# Patient Record
Sex: Male | Born: 1950
Health system: Southern US, Community
[De-identification: ages and names within clinical notes are randomized; demographics above are authoritative.]

## PROBLEM LIST (undated history)

## (undated) DIAGNOSIS — I219 Acute myocardial infarction, unspecified: Secondary | ICD-10-CM

## (undated) DIAGNOSIS — I499 Cardiac arrhythmia, unspecified: Secondary | ICD-10-CM

## (undated) DIAGNOSIS — N200 Calculus of kidney: Secondary | ICD-10-CM

## (undated) DIAGNOSIS — F329 Major depressive disorder, single episode, unspecified: Secondary | ICD-10-CM

## (undated) DIAGNOSIS — G20 Parkinson's disease: Secondary | ICD-10-CM

## (undated) DIAGNOSIS — Z87442 Personal history of urinary calculi: Secondary | ICD-10-CM

## (undated) DIAGNOSIS — F419 Anxiety disorder, unspecified: Secondary | ICD-10-CM

## (undated) DIAGNOSIS — Z860100 Personal history of colon polyps, unspecified: Secondary | ICD-10-CM

## (undated) DIAGNOSIS — K219 Gastro-esophageal reflux disease without esophagitis: Secondary | ICD-10-CM

## (undated) DIAGNOSIS — F32A Depression, unspecified: Secondary | ICD-10-CM

## (undated) DIAGNOSIS — I10 Essential (primary) hypertension: Secondary | ICD-10-CM

## (undated) DIAGNOSIS — R011 Cardiac murmur, unspecified: Secondary | ICD-10-CM

## (undated) DIAGNOSIS — I2510 Atherosclerotic heart disease of native coronary artery without angina pectoris: Secondary | ICD-10-CM

## (undated) HISTORY — PX: OTHER SURGICAL HISTORY: SHX169

---
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
## 2002-09-19 ENCOUNTER — Encounter: Admission: RE | Admit: 2002-09-19 | Discharge: 2002-09-19 | Payer: Self-pay | Admitting: Family Medicine

## 2002-09-27 ENCOUNTER — Encounter: Admission: RE | Admit: 2002-09-27 | Discharge: 2002-09-27 | Payer: Self-pay | Admitting: Family Medicine

## 2002-10-21 ENCOUNTER — Encounter: Admission: RE | Admit: 2002-10-21 | Discharge: 2002-10-21 | Payer: Self-pay | Admitting: Family Medicine

## 2003-06-27 ENCOUNTER — Encounter: Admission: RE | Admit: 2003-06-27 | Discharge: 2003-06-27 | Payer: Self-pay | Admitting: Family Medicine

## 2004-04-29 ENCOUNTER — Encounter: Admission: RE | Admit: 2004-04-29 | Discharge: 2004-04-29 | Payer: Self-pay | Admitting: Family Medicine

## 2004-06-05 ENCOUNTER — Emergency Department (HOSPITAL_COMMUNITY): Admission: EM | Admit: 2004-06-05 | Discharge: 2004-06-05 | Payer: Self-pay | Admitting: *Deleted

## 2004-07-10 ENCOUNTER — Ambulatory Visit (HOSPITAL_COMMUNITY): Admission: RE | Admit: 2004-07-10 | Discharge: 2004-07-10 | Payer: Self-pay | Admitting: Gastroenterology

## 2004-08-23 ENCOUNTER — Ambulatory Visit: Payer: Self-pay | Admitting: Family Medicine

## 2004-08-29 ENCOUNTER — Encounter: Admission: RE | Admit: 2004-08-29 | Discharge: 2004-08-29 | Payer: Self-pay | Admitting: Family Medicine

## 2004-08-30 ENCOUNTER — Ambulatory Visit: Payer: Self-pay | Admitting: Sports Medicine

## 2005-09-29 ENCOUNTER — Ambulatory Visit: Payer: Self-pay | Admitting: Family Medicine

## 2005-10-07 ENCOUNTER — Ambulatory Visit (HOSPITAL_COMMUNITY): Admission: RE | Admit: 2005-10-07 | Discharge: 2005-10-07 | Payer: Self-pay | Admitting: Family Medicine

## 2006-01-05 ENCOUNTER — Ambulatory Visit: Payer: Self-pay | Admitting: Family Medicine

## 2006-05-08 ENCOUNTER — Ambulatory Visit: Payer: Self-pay | Admitting: Family Medicine

## 2006-05-12 ENCOUNTER — Ambulatory Visit (HOSPITAL_COMMUNITY): Admission: RE | Admit: 2006-05-12 | Discharge: 2006-05-12 | Payer: Self-pay | Admitting: Family Medicine

## 2006-05-12 ENCOUNTER — Ambulatory Visit: Payer: Self-pay | Admitting: Sports Medicine

## 2006-05-12 ENCOUNTER — Encounter (INDEPENDENT_AMBULATORY_CARE_PROVIDER_SITE_OTHER): Payer: Self-pay | Admitting: *Deleted

## 2006-05-14 ENCOUNTER — Ambulatory Visit: Payer: Self-pay | Admitting: Family Medicine

## 2007-01-07 DIAGNOSIS — N529 Male erectile dysfunction, unspecified: Secondary | ICD-10-CM

## 2007-01-07 DIAGNOSIS — M25519 Pain in unspecified shoulder: Secondary | ICD-10-CM

## 2007-01-07 DIAGNOSIS — E785 Hyperlipidemia, unspecified: Secondary | ICD-10-CM

## 2007-01-07 DIAGNOSIS — E669 Obesity, unspecified: Secondary | ICD-10-CM | POA: Insufficient documentation

## 2007-01-07 DIAGNOSIS — D126 Benign neoplasm of colon, unspecified: Secondary | ICD-10-CM

## 2007-01-07 HISTORY — DX: Hyperlipidemia, unspecified: E78.5

## 2007-01-07 HISTORY — DX: Male erectile dysfunction, unspecified: N52.9

## 2009-03-14 ENCOUNTER — Ambulatory Visit: Payer: Self-pay | Admitting: Family Medicine

## 2009-03-14 DIAGNOSIS — I10 Essential (primary) hypertension: Secondary | ICD-10-CM | POA: Insufficient documentation

## 2009-03-14 HISTORY — DX: Essential (primary) hypertension: I10

## 2009-03-15 ENCOUNTER — Ambulatory Visit: Payer: Self-pay | Admitting: Family Medicine

## 2009-03-15 ENCOUNTER — Encounter: Payer: Self-pay | Admitting: Family Medicine

## 2009-03-16 LAB — CONVERTED CEMR LAB
ALT: 35 units/L (ref 0–53)
AST: 24 units/L (ref 0–37)
Albumin: 4.8 g/dL (ref 3.5–5.2)
Alkaline Phosphatase: 61 units/L (ref 39–117)
BUN: 14 mg/dL (ref 6–23)
CO2: 26 meq/L (ref 19–32)
Calcium: 10 mg/dL (ref 8.4–10.5)
Chloride: 101 meq/L (ref 96–112)
Cholesterol: 255 mg/dL — ABNORMAL HIGH (ref 0–200)
Creatinine, Ser: 1.12 mg/dL (ref 0.40–1.50)
Glucose, Bld: 103 mg/dL — ABNORMAL HIGH (ref 70–99)
HDL: 41 mg/dL (ref >39)
LDL Cholesterol: 167 mg/dL — ABNORMAL HIGH (ref 0–99)
PSA: 0.38 ng/mL (ref 0.10–4.00)
Potassium: 4.4 meq/L (ref 3.5–5.3)
Sodium: 139 meq/L (ref 135–145)
Total Bilirubin: 0.4 mg/dL (ref 0.3–1.2)
Total CHOL/HDL Ratio: 6.2
Total Protein: 7.4 g/dL (ref 6.0–8.3)
Triglycerides: 235 mg/dL — ABNORMAL HIGH (ref <150)
VLDL: 47 mg/dL — ABNORMAL HIGH (ref 0–40)

## 2009-11-25 ENCOUNTER — Emergency Department (HOSPITAL_COMMUNITY): Admission: EM | Admit: 2009-11-25 | Discharge: 2009-11-25 | Payer: Self-pay | Admitting: Emergency Medicine

## 2009-11-29 ENCOUNTER — Ambulatory Visit (HOSPITAL_COMMUNITY): Admission: RE | Admit: 2009-11-29 | Discharge: 2009-11-29 | Payer: Self-pay | Admitting: Urology

## 2009-12-10 ENCOUNTER — Encounter: Payer: Self-pay | Admitting: Family Medicine

## 2010-01-21 ENCOUNTER — Telehealth: Payer: Self-pay | Admitting: Family Medicine

## 2010-01-21 ENCOUNTER — Ambulatory Visit: Payer: Self-pay | Admitting: Family Medicine

## 2010-01-21 ENCOUNTER — Encounter: Payer: Self-pay | Admitting: Family Medicine

## 2010-01-21 DIAGNOSIS — M722 Plantar fascial fibromatosis: Secondary | ICD-10-CM

## 2010-02-21 ENCOUNTER — Encounter: Payer: Self-pay | Admitting: Family Medicine

## 2010-06-05 ENCOUNTER — Ambulatory Visit: Payer: Self-pay | Admitting: Family Medicine

## 2010-06-05 ENCOUNTER — Encounter: Payer: Self-pay | Admitting: *Deleted

## 2010-06-05 DIAGNOSIS — M7710 Lateral epicondylitis, unspecified elbow: Secondary | ICD-10-CM | POA: Insufficient documentation

## 2010-06-08 ENCOUNTER — Emergency Department (HOSPITAL_COMMUNITY): Admission: EM | Admit: 2010-06-08 | Discharge: 2010-06-08 | Payer: Self-pay | Admitting: Family Medicine

## 2010-06-21 ENCOUNTER — Telehealth: Payer: Self-pay | Admitting: *Deleted

## 2010-06-27 ENCOUNTER — Telehealth: Payer: Self-pay | Admitting: Sports Medicine

## 2010-12-10 NOTE — Letter (Signed)
Summary: Out of Work  [facility]  [address]   [place], [place] [address]   Phone: [phone]  Fax: [phone]    January 21, 2010   Employee:  BERND CROM    To Whom It May Concern:   Mr Kosmicki was seen in our clinic today.  He should be excused from work today.  For Medical reasons, Mr.  Negro is unable to walk or stand for long periods of time.  He needs to rest his foot frequently.    If you need additional information, please feel free to contact our office.         Sincerely,    Sarah Swaziland MD

## 2010-12-10 NOTE — Assessment & Plan Note (Signed)
Summary: plantar fasciitis   Vital Signs:  Patient profile:   60 year old male Height:      71.5 inches Weight:      231.3 pounds BMI:     31.93 Temp:     98.2 degrees F oral Pulse rate:   71 / minute BP sitting:   179 / 81  (left arm) Cuff size:   large  Vitals Entered By: Gladstone Pih (January 21, 2010 10:57 AM)  Serial Vital Signs/Assessments:  Time      Position  BP       Pulse  Resp  Temp     By                     152/70                         Jaedah Lords Swaziland MD  CC: C/O right heel pain X 2 weeks Is Patient Diabetic? No Pain Assessment Patient in pain? yes     Location: foot Intensity: 7 Type: sharp Comments denies trauma   CC:  C/O right heel pain X 2 weeks.  History of Present Illness: 60 yo male with heel pain.  2.5 - 3 weeks ago, heel started hurting when walking at work.  Pain gradually worsened, got bad yesterday, and this morning, he couldn't put foot on floor until he put his shoe on.  L foot fine.  R heel pain 7/10. Pain worse in morning, like someone driving a spike through foot.   Using cane to help.   Lying down and staying off of it helps a little.  Tried elevation, with some relief.  Tried Advil (2) yesterday without relief.  No injury.  Did have hammer toe operation 2 -3 years ago.    At work is standing on different surfaces - concrete, blacktop, carpet.  Pain is interfering with work.    Habits & Providers  Alcohol-Tobacco-Diet     Tobacco Status: never  Current Medications (verified): 1)  Hydrochlorothiazide 25 Mg  Tabs (Hydrochlorothiazide) .... Take 1 Tab By Mouth Every Morning 2)  Aspirin 81 Mg  Tbec (Aspirin) .... One By Mouth Every Day 3)  Simvastatin 20 Mg Tabs (Simvastatin) .Marland Kitchen.. 1 By Mouth At Bedtime 4)  Vitamin B-12 500 Mcg Tabs (Cyanocobalamin) .Marland Kitchen.. 1 By Mouth Daily 5)  Vitamin C 500 Mg Tabs (Ascorbic Acid) .Marland Kitchen.. 1 By Mouth Daily 6)  Folic Acid 400 Mcg Tabs (Folic Acid) .Marland Kitchen.. 1 By Mouth Daily 7)  Multivitamins  Tabs (Multiple Vitamin)  .Marland Kitchen.. 1 By Mouth Daily 8)  Calcium-Vitamin D 600-125 Mg-Unit Tabs (Calcium-Vitamin D) .Marland Kitchen.. 1 By Mouth Daily 9)  Flax Seed Oil 1000 Mg Caps (Flaxseed (Linseed)) .Marland Kitchen.. 1 By Mouth Daily 10)  Omega-3 350 Mg Caps (Omega-3 Fatty Acids) .... 2 By Mouth Daily 11)  Zinc 50 Mg Tabs (Zinc) .Marland Kitchen.. 1 By Mouth Daily  Allergies (verified): 1)  ! Codeine  Past History:  Social History: Last updated: 01/07/2007 smoking quit; ETOH 2 beers per weekend; maintain super at apartment complex; Walks all the time in his work, physically demanding; Diet, was bad, now low fat  Review of Systems       see HPI  Physical Exam  General:  Well-developed,well-nourished,in pain but no acute distress; alert,appropriate and cooperative throughout examination.  Vitals noted Extremities:  L foot with various callouses.  No pain.  No C/C/E. R foot very tender at heel.  + callouses.  +  well healed scar 2nd toe (s/p hammer toe surgery).  Pain with ambulation R heel.   Impression & Recommendations:  Problem # 1:  PLANTAR FASCIITIS, RIGHT (ICD-728.71)  Pt to try ice, supportive shoes at all times, NSAIDS.  Advised this will take a long time to heal, but if no relief at all in 1 week, then pt should contact us for possible SM referral for orthotics. His updated medication list for this problem includes:    Naproxen 500 Mg Tabs (Naproxen) .Marland Kitchen... Take 1 by mouth two times a day for pain.  Orders: [facility]- Est Level  3 ([address]454)  Problem # 2:  ESSENTIAL HYPERTENSION, BENIGN (ICD-401.1)  Recheck improved, but not at goal.  May be elevated from pain.  F/U with Dr. Leveda Anna within 1 month. His updated medication list for this problem includes:    Hydrochlorothiazide 25 Mg Tabs (Hydrochlorothiazide) .Marland Kitchen... Take 1 tab by mouth every morning  Orders: [facility]- Est Level  3 ([address]11)  Complete Medication List: 1)  Hydrochlorothiazide 25 Mg Tabs (Hydrochlorothiazide) .... Take 1 tab by mouth every morning 2)  Aspirin 81 Mg Tbec (Aspirin) ....  One by mouth every day 3)  Simvastatin 20 Mg Tabs (Simvastatin) .Marland Kitchen.. 1 by mouth at bedtime 4)  Vitamin B-12 500 Mcg Tabs (Cyanocobalamin) .Marland Kitchen.. 1 by mouth daily 5)  Vitamin C 500 Mg Tabs (Ascorbic acid) .Marland Kitchen.. 1 by mouth daily 6)  Folic Acid 400 Mcg Tabs (Folic acid) .Marland Kitchen.. 1 by mouth daily 7)  Multivitamins Tabs (Multiple vitamin) .Marland Kitchen.. 1 by mouth daily 8)  Calcium-vitamin D 600-125 Mg-unit Tabs (Calcium-vitamin d) .Marland Kitchen.. 1 by mouth daily 9)  Flax Seed Oil 1000 Mg Caps (Flaxseed (linseed)) .Marland Kitchen.. 1 by mouth daily 10)  Omega-3 350 Mg Caps (Omega-3 fatty acids) .... 2 by mouth daily 11)  Zinc 50 Mg Tabs (Zinc) .Marland Kitchen.. 1 by mouth daily 12)  Naproxen 500 Mg Tabs (Naproxen) .... Take 1 by mouth two times a day for pain.  Patient Instructions: 1)  You have plantar fasciitis (your wife was right!). 2)  You can take naprosyn twice a day for the pain.  Try ice three times a day if possible.  Be sure to always wear shoes, especially when you first get out of bed. 3)  Try the exercises at home. 4)    It will take a several weeks, and maybe months before you are completely better.  But, if you are not starting to feel a little better in 1 week, let us know. Prescriptions: NAPROXEN 500 MG TABS (NAPROXEN) Take 1 by mouth two times a day for pain.  #60 x 3   Entered and Authorized by:   Shakeeta Godette Swaziland MD   Signed by:   Keianna Signer Swaziland MD on 01/21/2010   Method used:   Electronically to        [facility] [phone]* (retail)       [address]       [place], [place]  [address]       Ph: [phone]       Fax: [phone]   RxID:   [id number]

## 2010-12-10 NOTE — Miscellaneous (Signed)
Summary: Flu shot  Flu shot   Imported By: De Nurse 12/14/2009 17:00:14  _____________________________________________________________________  External Attachment:    Type:   Image     Comment:   External Document  Appended Document: Flu shot     Clinical Lists Changes  Observations: Added new observation of DM PROGRESS: N/A (12/14/2009 17:06) Added new observation of DM FSREVIEW: N/A (12/14/2009 17:06) Added new observation of FLU VAX: Historical (11/29/2009 17:07)  Immunization History:  Influenza Immunization History:    Influenza:  historical (11/29/2009)        Prevention & Chronic Care Immunizations   Influenza vaccine: Historical  (11/29/2009)    Tetanus booster: 03/14/2009: Tdap   Tetanus booster due: 11/10/2006    Pneumococcal vaccine: Not documented  Colorectal Screening   Hemoccult: Done.  (09/10/2002)   Hemoccult due: Not Indicated    Colonoscopy: Done.  (06/10/2004)   Colonoscopy due: 06/10/2014  Other Screening   PSA: 0.38  (03/15/2009)   Smoking status: never  (03/14/2009)  Lipids   Total Cholesterol: 255  (03/15/2009)   LDL: 167  (03/15/2009)   LDL Direct: Not documented   HDL: 41  (03/15/2009)   Triglycerides: 235  (03/15/2009)    SGOT (AST): 24  (03/15/2009)   SGPT (ALT): 35  (03/15/2009)   Alkaline phosphatase: 61  (03/15/2009)   Total bilirubin: 0.4  (03/15/2009)  Hypertension   Last Blood Pressure: 162 / 91  (03/14/2009)   Serum creatinine: 1.12  (03/15/2009)   Serum potassium 4.4  (03/15/2009)  Self-Management Support :    Hypertension self-management support: Not documented    Lipid self-management support: Not documented

## 2010-12-10 NOTE — Assessment & Plan Note (Signed)
Summary: elbow pain,df   Vital Signs:  Patient profile:   60 year old male Height:      71.5 inches Weight:      222.6 pounds BMI:     30.72 Temp:     98.1 degrees F oral Pulse rate:   55 / minute BP sitting:   147 / 74  (right arm) Cuff size:   regular  Vitals Entered By: Garen Grams LPN (June 05, 2010 9:31 AM) CC: right elbow pain x 1 month Is Patient Diabetic? No Pain Assessment Patient in pain? no        CC:  right elbow pain x 1 month.  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  Makes it hard for him to work, pain is sharp and severe.  No radiation.  Hasnt taken any meds. Better with rest.    Habits & Providers  Alcohol-Tobacco-Diet     Tobacco Status: never  Current Medications (verified): 1)  Hydrochlorothiazide 25 Mg  Tabs (Hydrochlorothiazide) .... Take 1 Tab By Mouth Every Morning 2)  Aspirin 81 Mg  Tbec (Aspirin) .... One By Mouth Every Day 3)  Simvastatin 20 Mg Tabs (Simvastatin) .Marland Kitchen.. 1 By Mouth At Bedtime 4)  Vitamin B-12 500 Mcg Tabs (Cyanocobalamin) .Marland Kitchen.. 1 By Mouth Daily 5)  Vitamin C 500 Mg Tabs (Ascorbic Acid) .Marland Kitchen.. 1 By Mouth Daily 6)  Folic Acid 400 Mcg Tabs (Folic Acid) .Marland Kitchen.. 1 By Mouth Daily 7)  Multivitamins  Tabs (Multiple Vitamin) .Marland Kitchen.. 1 By Mouth Daily 8)  Calcium-Vitamin D 600-125 Mg-Unit Tabs (Calcium-Vitamin D) .Marland Kitchen.. 1 By Mouth Daily 9)  Flax Seed Oil 1000 Mg Caps (Flaxseed (Linseed)) .Marland Kitchen.. 1 By Mouth Daily 10)  Omega-3 350 Mg Caps (Omega-3 Fatty Acids) .... 2 By Mouth Daily 11)  Zinc 50 Mg Tabs (Zinc) .Marland Kitchen.. 1 By Mouth Daily 12)  Mobic 7.5 Mg Tabs (Meloxicam) .... One Tab By Mouth Daily  Allergies (verified): 1)  ! Codeine  Review of Systems       See HPI  Physical Exam  General:  Well-developed,well-nourished,in no acute distress; alert,appropriate and cooperative throughout examination Msk:  Normal to inspection. Palpation  with exquisite tenderness over lateral epicondyle/extensor tendon origin. ROM full but limited by pain with resisted extension of wrist.   Stable to varus/valgus stress on elbow.  NVI distally to radial, ulnar, and medial distributions.  Ulnar and radial pulses palpable. Additional Exam:  Consent obtained and verified. Sterile betadine prep. Furthur cleansed with alcohol. Topical analgesic spray: Ethyl chloride. Joint:Lateral epicondyle/extensor tendon insertion. Needle inserted at point of maximal tenderness, tapped bone atraumatically, needle pulled back 1-81mm, syringe withdrawn to ensure not in a vessel, steroid/lidocaine injected with a fanning pattern. Completed without difficulty Meds:1cc kenalog 40, 2cc lidocaine. Needle:25g Aftercare instructions and Red flags advised. Immediate relief in symptoms experienced  Forearm strap fitted.      Impression & Recommendations:  Problem # 1:  LATERAL EPICONDYLITIS, RIGHT (ICD-726.32) Assessment New S/p Injection w/immediate relief in symptoms. Mobic 7.5 mg daily Forearm strap Rest if possible for 1-2wk. Slowly return to full activity over 1-2wk fu with me 2wk to ensure not getting worse.  Orders: [facility]- Est Level  3 ([address]) Injection, large joint- [facility] (91478) Pneumatic arm band- [facility] (G9562)  Complete Medication List: 1)  Hydrochlorothiazide 25 Mg Tabs (Hydrochlorothiazide) .... Take 1 tab by mouth every morning 2)  Aspirin 81 Mg  Tbec (Aspirin) .... One by mouth every day 3)  Simvastatin 20 Mg Tabs (Simvastatin) .Marland Kitchen.. 1 by mouth at bedtime 4)  Vitamin B-12 500 Mcg Tabs (Cyanocobalamin) .Marland Kitchen.. 1 by mouth daily 5)  Vitamin C 500 Mg Tabs (Ascorbic acid) .Marland Kitchen.. 1 by mouth daily 6)  Folic Acid 400 Mcg Tabs (Folic acid) .Marland Kitchen.. 1 by mouth daily 7)  Multivitamins Tabs (Multiple vitamin) .Marland Kitchen.. 1 by mouth daily 8)  Calcium-vitamin D 600-125 Mg-unit Tabs (Calcium-vitamin d) .Marland Kitchen.. 1 by mouth daily 9)  Flax Seed Oil 1000 Mg Caps (Flaxseed (linseed))  .Marland Kitchen.. 1 by mouth daily 10)  Omega-3 350 Mg Caps (Omega-3 fatty acids) .... 2 by mouth daily 11)  Zinc 50 Mg Tabs (Zinc) .Marland Kitchen.. 1 by mouth daily 12)  Mobic 7.5 Mg Tabs (Meloxicam) .... One tab by mouth daily Prescriptions: MOBIC 7.5 MG TABS (MELOXICAM) One tab by mouth daily  #30 x 0   Entered and Authorized by:   Rodney Langton MD   Signed by:   Rodney Langton MD on 06/05/2010   Method used:   Electronically to        [facility] [phone]* (retail)       [address]       [place], [place]  [address]       Ph: [phone]       Fax: [phone]   RxID:   [id number]

## 2010-12-10 NOTE — Progress Notes (Signed)
Summary: Follow up of lateral epicondyle injection  Phone Note Outgoing Call Call back at Home Phone [phone]   Call placed by: Rodney Langton MD,  June 27, 2010 12:25 PM Summary of Call: Call pt regarding elbow injection.  Spoke with wife.  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  Unfortunately when he went back to work the day after the injection they fired him.  She thinks it was due to the elbow injury.  He is currently at a job fair looking for a job.  I advised that if he needed I could write a letter stating that his elbow was completely healed s/p injection and that he could perform at 100% capacity.  She said she would run that by him when he got back from the job fair and will let me know. Initial call taken by: Rodney Langton MD,  June 27, 2010 12:27 PM

## 2010-12-10 NOTE — Miscellaneous (Signed)
Summary: med refill   Clinical Lists Changes Refilled simvastatin and HCTZ to [facility] via fax request.  Also called and LM that he is due for an appointment. Doralee Albino MD  February 21, 2010 10:09 AM  Medications: Rx of SIMVASTATIN 20 MG TABS (SIMVASTATIN) 1 by mouth at bedtime;  #90 x 3;  Signed;  Entered by: Doralee Albino MD;  Authorized by: Doralee Albino MD;  Method used: Handwritten Rx of HYDROCHLOROTHIAZIDE 25 MG  TABS (HYDROCHLOROTHIAZIDE) Take 1 tab by mouth every morning;  #90 x 3;  Signed;  Entered by: Doralee Albino MD;  Authorized by: Doralee Albino MD;  Method used: Handwritten Observations: Added new observation of LLIMPORTMEDS: completed (02/21/2010 10:04)    Prescriptions: HYDROCHLOROTHIAZIDE 25 MG  TABS (HYDROCHLOROTHIAZIDE) Take 1 tab by mouth every morning  #90 x 3   Entered and Authorized by:   Doralee Albino MD   Signed by:   Doralee Albino MD on 02/21/2010   Method used:   Handwritten   RxID:   [id number] SIMVASTATIN 20 MG TABS (SIMVASTATIN) 1 by mouth at bedtime  #90 x 3   Entered and Authorized by:   Doralee Albino MD   Signed by:   Doralee Albino MD on 02/21/2010   Method used:   Handwritten   RxID:   [id number]   Appended Document: med refill [facility] called to verify directions on above two RX because no directions were written out that they received. RX verbally given for HCTZ and Simvastatin.Marland Kitchen

## 2010-12-10 NOTE — Progress Notes (Signed)
Summary: phone note  Phone Note Outgoing Call   Call placed by: Loralee Pacas CMA,  June 21, 2010 5:05 PM Summary of Call: follow up on inj of elbow.

## 2010-12-10 NOTE — Progress Notes (Signed)
Summary: Triage   Phone Note Call from Patient Call back at Home Phone [phone]   Caller: wife Reason for Call: Talk to Nurse Summary of Call: per wife pt has bone spur, wants to be seen today Initial call taken by: Knox Royalty,  January 21, 2010 9:54 AM  Follow-up for Phone Call        pain from "bone spur" x 2 wks. no hx of bone. heel on R foot is painful. appt with Dr. Swaziland at 11am today. he is aware he will not be seeing his pcp Follow-up by: Golden Circle RN,  January 21, 2010 9:58 AM  Additional Follow-up for Phone Call Additional follow up Details #1::        noted and agree Additional Follow-up by: Doralee Albino MD,  January 21, 2010 11:51 AM

## 2010-12-10 NOTE — Letter (Signed)
Summary: Out of Work  [facility]  [address]   [place], [place] [address]   Phone: [phone]  Fax: [phone]    June 05, 2010   Employee:  ANGUEL DELAPENA    To Whom It May Concern:   For Medical reasons, please excuse the above named employee from work for the following dates:  Start:  June 05, 2010 10:15 AM   End:   June 10, 2010   If you need additional information, please feel free to contact our office.         Sincerely,    Garen Grams LPN for Rodney Langton MD

## 2011-01-25 LAB — POCT URINALYSIS DIP (DEVICE)
Glucose, UA: NEGATIVE mg/dL
Ketones, ur: NEGATIVE mg/dL
Specific Gravity, Urine: >=1.03 (ref 1.005–1.030)
Urobilinogen, UA: 0.2 mg/dL (ref 0.0–1.0)

## 2011-01-25 LAB — DIFFERENTIAL
Lymphocytes Relative: 16 % (ref 12–46)
Lymphs Abs: 1.7 10*3/uL (ref 0.7–4.0)
Neutro Abs: 7.8 10*3/uL — ABNORMAL HIGH (ref 1.7–7.7)
Neutrophils Relative %: 75 % (ref 43–77)

## 2011-01-25 LAB — CBC
HCT: 44.8 % (ref 39.0–52.0)
Hemoglobin: 15.8 g/dL (ref 13.0–17.0)
MCH: 33.8 pg (ref 26.0–34.0)
MCHC: 35.3 g/dL (ref 30.0–36.0)
MCV: 95.6 fL (ref 78.0–100.0)
Platelets: 211 10*3/uL (ref 150–400)
RBC: 4.69 MIL/uL (ref 4.22–5.81)
RDW: 13.3 % (ref 11.5–15.5)
WBC: 10.4 10*3/uL (ref 4.0–10.5)

## 2011-01-25 LAB — POCT I-STAT, CHEM 8
Calcium, Ion: 1.13 mmol/L (ref 1.12–1.32)
Chloride: 104 mEq/L (ref 96–112)
HCT: 47 % (ref 39.0–52.0)
Potassium: 3.2 mEq/L — ABNORMAL LOW (ref 3.5–5.1)
Sodium: 140 mEq/L (ref 135–145)

## 2011-01-25 LAB — D-DIMER, QUANTITATIVE: D-Dimer, Quant: <0.22 ug/mL-FEU (ref 0.00–0.48)

## 2011-01-25 LAB — SEDIMENTATION RATE: Sed Rate: 4 mm/hr (ref 0–16)

## 2011-01-27 LAB — URINALYSIS, ROUTINE W REFLEX MICROSCOPIC
Bilirubin Urine: NEGATIVE
Glucose, UA: NEGATIVE mg/dL
Specific Gravity, Urine: 1.025 (ref 1.005–1.030)
pH: 6 (ref 5.0–8.0)

## 2011-01-27 LAB — CBC
Hemoglobin: 15.5 g/dL (ref 13.0–17.0)
MCHC: 33.7 g/dL (ref 30.0–36.0)
RBC: 4.81 MIL/uL (ref 4.22–5.81)

## 2011-01-27 LAB — BASIC METABOLIC PANEL
CO2: 24 mEq/L (ref 19–32)
Calcium: 9.4 mg/dL (ref 8.4–10.5)
GFR calc Af Amer: >60 mL/min (ref >60)
GFR calc non Af Amer: >60 mL/min (ref >60)
Sodium: 133 mEq/L — ABNORMAL LOW (ref 135–145)

## 2011-01-27 LAB — DIFFERENTIAL
Lymphocytes Relative: 23 % (ref 12–46)
Lymphs Abs: 2 10*3/uL (ref 0.7–4.0)
Monocytes Absolute: 0.6 10*3/uL (ref 0.1–1.0)
Monocytes Relative: 7 % (ref 3–12)
Neutro Abs: 5.9 10*3/uL (ref 1.7–7.7)

## 2011-01-27 LAB — URINE CULTURE: Colony Count: NO GROWTH

## 2011-01-27 LAB — URINE MICROSCOPIC-ADD ON

## 2011-03-28 NOTE — Op Note (Signed)
NAME:  Christopher Weeks, Christopher Weeks                           ACCOUNT NO.:  [id number]   MEDICAL RECORD NO.:  [id number]                   PATIENT TYPE:  AMB   LOCATION:  ENDO                                 FACILITY:  [facility]   PHYSICIAN:  Anselmo Rod, M.D.               DATE OF BIRTH:  06-Oct-1951   DATE OF PROCEDURE:  DATE OF DISCHARGE:                                 OPERATIVE REPORT   DATE OF PROCEDURE:  July 10, 2004.   PROCEDURE PERFORMED:  Screening colonoscopy.   ENDOSCOPIST:  Anselmo Rod, MD.   INSTRUMENT USED:  Olympus video colonoscope.   INDICATIONS FOR PROCEDURE:  A 60 year old white male with a history of  diarrhea and a rectal fistula, and a personal history of chronic polyps,  undergoing a repeat colonoscopy for blood in stool noted on physical exam in  the office.  Rule out colonic polyps, masses, etc.   PRE-PROCEDURE PREPARATION:  Informed consent was procured from the patient.  The patient was fasted for 8 hours prior to the procedure and prepped with a  bottle of magnesium citrate and a gallon of GoLYTELY the night prior to the  procedure.   PRE-PROCEDURE PHYSICAL EXAMINATION:  VITAL SIGNS:  Stable.  NECK:  Supple.  CHEST:  Clear to auscultation.  S1, S2 regular.  ABDOMEN:  Soft with normal bowel sounds.   DESCRIPTION OF PROCEDURE:  The patient was placed in the left lateral  decubitus position, sedated with 100 mg of Demerol and 10 mg of Versed in  slow, incremental doses.  Once the patient was adequately sedated and  maintained on low flow oxygen and continuous cardiac monitoring, the Olympus  video colonoscope was advanced from the rectum to the cecum.  The  appendiceal orifice and ileocecal valve were clearly visualized and  photographed.  Small, internal hemorrhoids were seen on retroflexion.  No  masses or polyps were identified.  The entire colonic mucosa had a healthy  vascular pattern.  There was no evidence of IBD.  No fistula was noted.  The  terminal ileum appeared normal.  The patient tolerated the procedure well  without any complications.   IMPRESSION:  1.  Normal colonoscopy up to the terminal ileum except for small, internal      hemorrhoids.  2.  No masses, polyps, or IBD noted.   RECOMMENDATIONS:  1.  Outpatient followup in the next two weeks for repeat guaiac testing.      Possibly an upper GI with small bowel follow-through if patient      continues to have blood in stool.  2.  Patient advised to call the office when he has a recurrent lesion around      the anus so that this can be evaluated      for possible abscess versus a fistula.  3.  Repeat colonoscopy has been recommended in the next five years unless  the patient develops any abdominal symptoms in the interim.                                               Anselmo Rod, M.D.    JNM/MEDQ  D:  07/10/2004  T:  07/10/2004  Job:  [id number]   cc:   William A. Leveda Anna, M.D.  Fax: [phone]

## 2011-06-20 ENCOUNTER — Other Ambulatory Visit: Payer: Self-pay | Admitting: Family Medicine

## 2011-06-20 NOTE — Telephone Encounter (Signed)
Refill request

## 2011-07-25 IMAGING — CT CT ABD-PELV W/O CM
2 of 3 series · 16 of 46 positions shown, 18 images · non-contrast
Comparison: None

CLINICAL DATA: Question kidney stone.  Right flank pain.

CT ABDOMEN AND PELVIS WITHOUT CONTRAST
TECHNIQUE: Multidetector CT imaging of the abdomen and pelvis was
performed following the standard protocol without intravenous
contrast.

[Series 2: standard/full over (age)lbs 5.0 · axial · 0.66mm/px · z∈[-658,-242]mm · 13 of 97 slices shown, 15 images]
[im 7/97  soft-tissue]
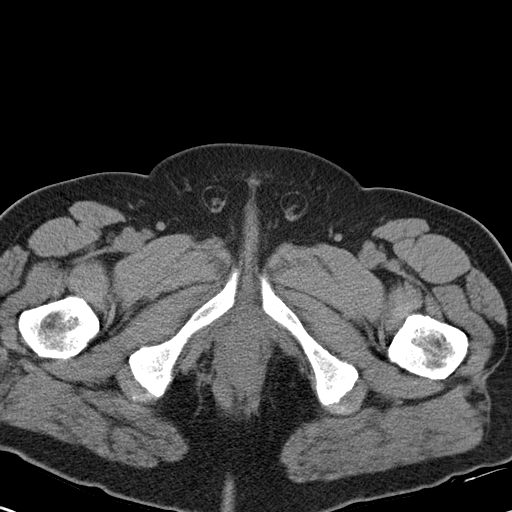
[im 7/97  bone]
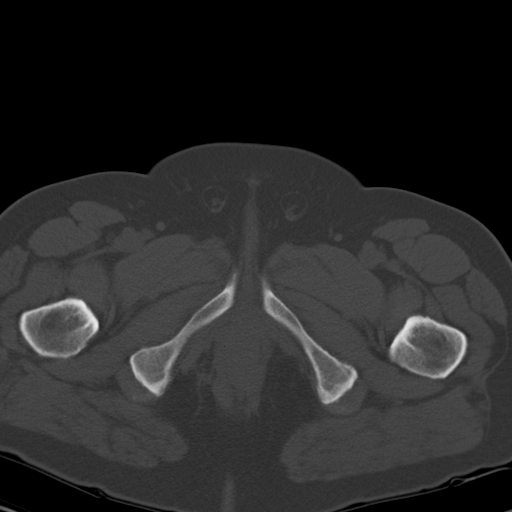
[im 13/97  soft-tissue]
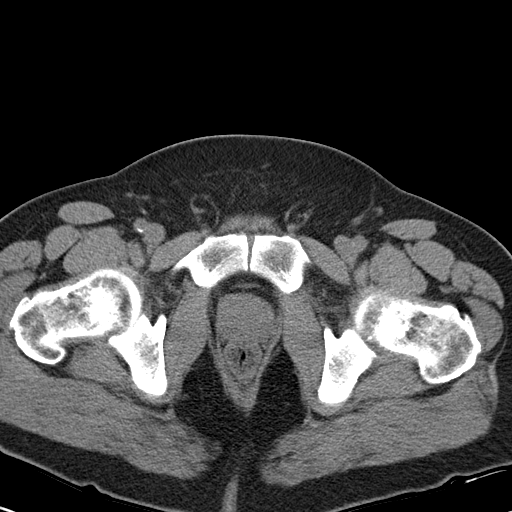
[im 19/97  soft-tissue]
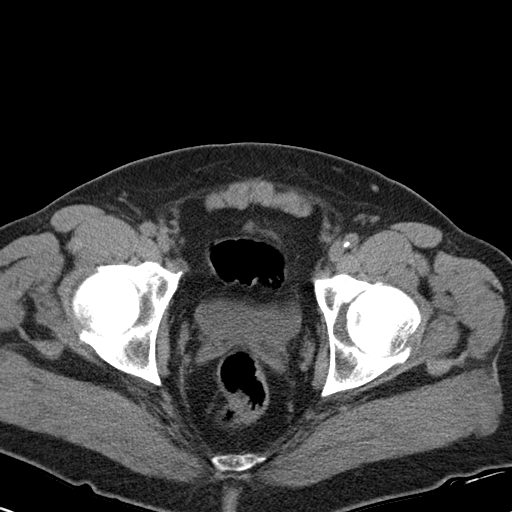
[im 28/97  soft-tissue]
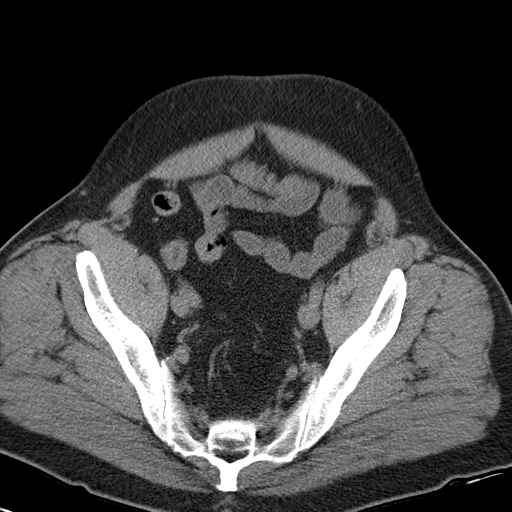
[im 35/97  soft-tissue]
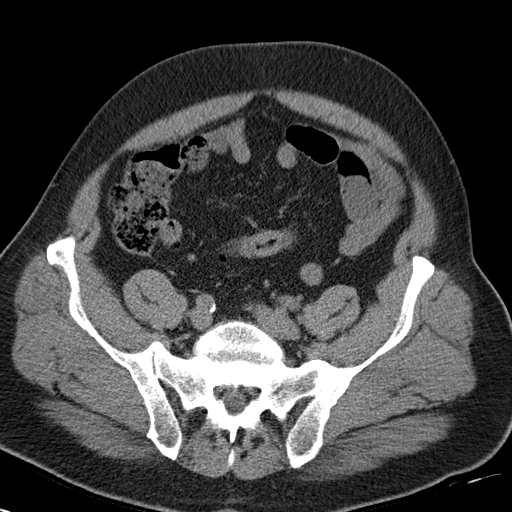
[im 41/97  soft-tissue]
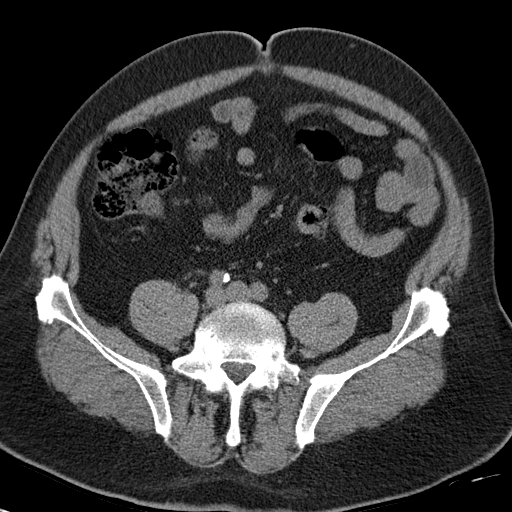
[im 50/97  soft-tissue]
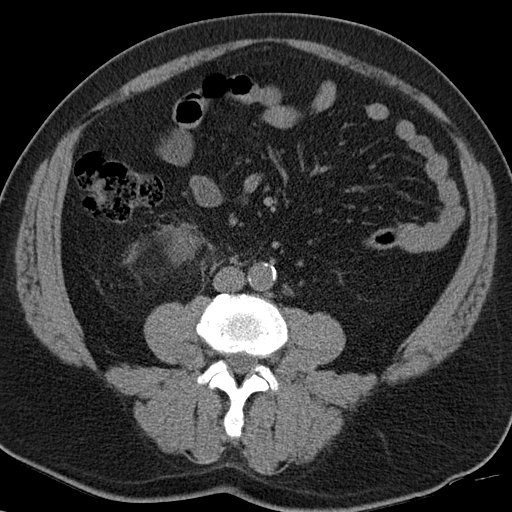
[im 56/97  soft-tissue]
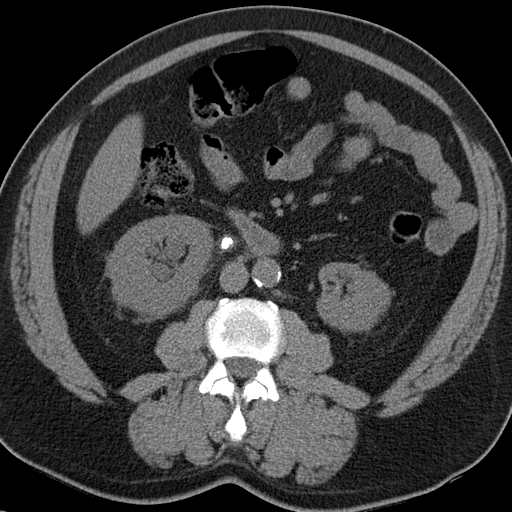
[im 62/97  soft-tissue]
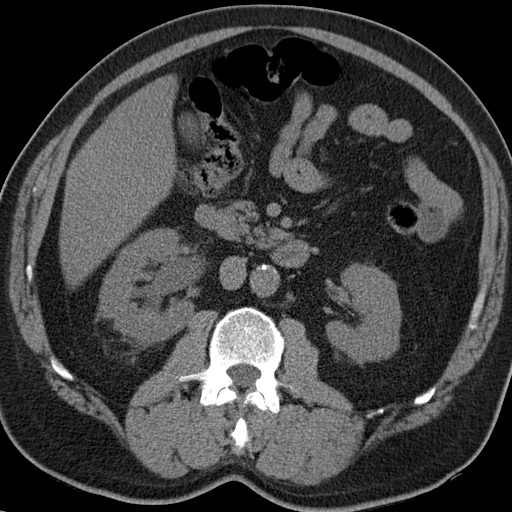
[im 62/97  bone]
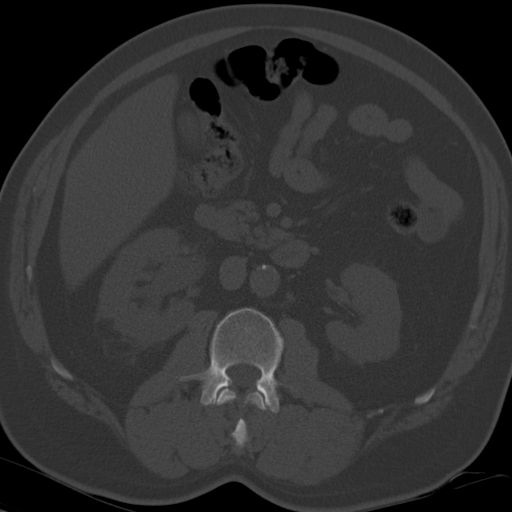
[im 69/97  soft-tissue]
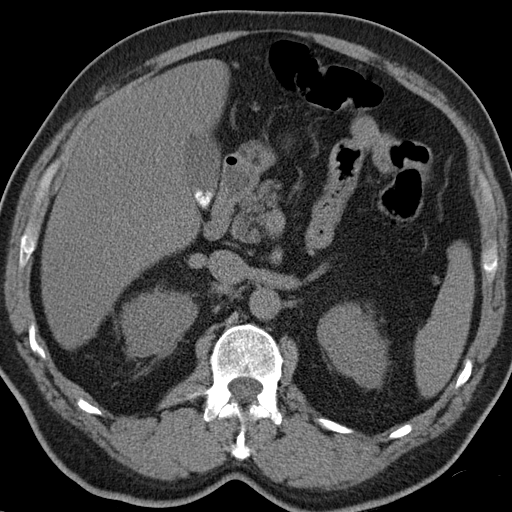
[im 78/97  soft-tissue]
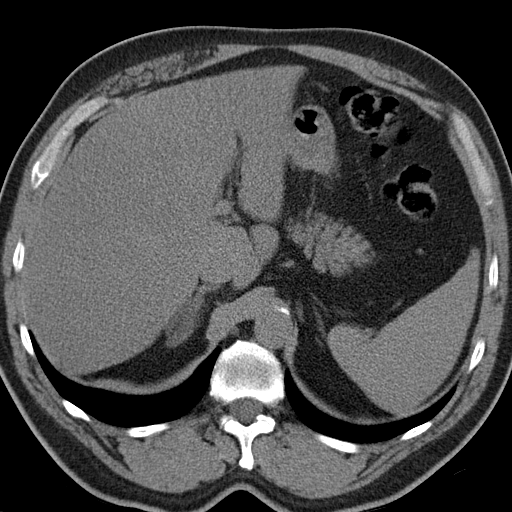
[im 84/97  soft-tissue]
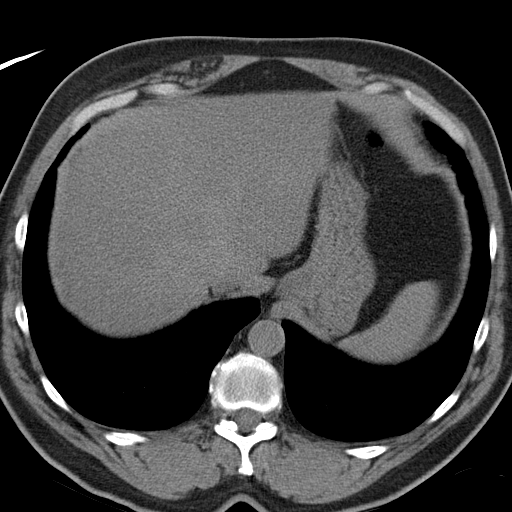
[im 90/97  soft-tissue]
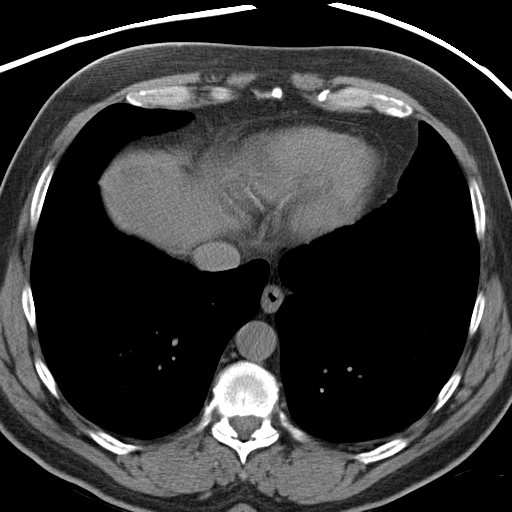

[Series 4: mpr coronal · coronal · 0.70mm/px · 3 of 110 slices shown]
[im 37/110  soft-tissue]
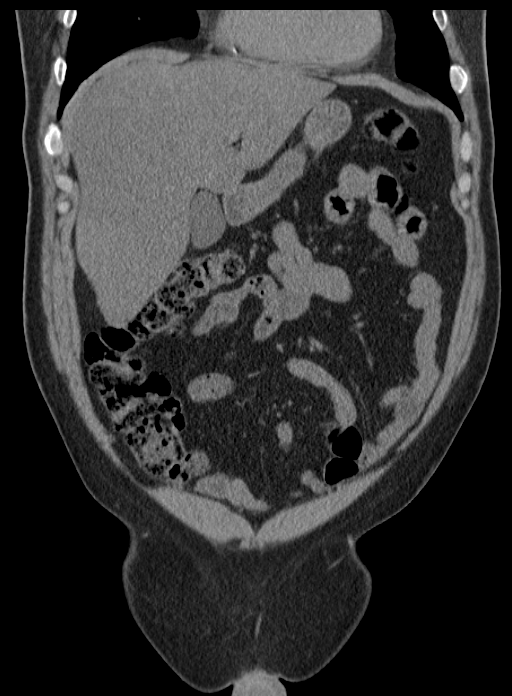
[im 49/110  soft-tissue]
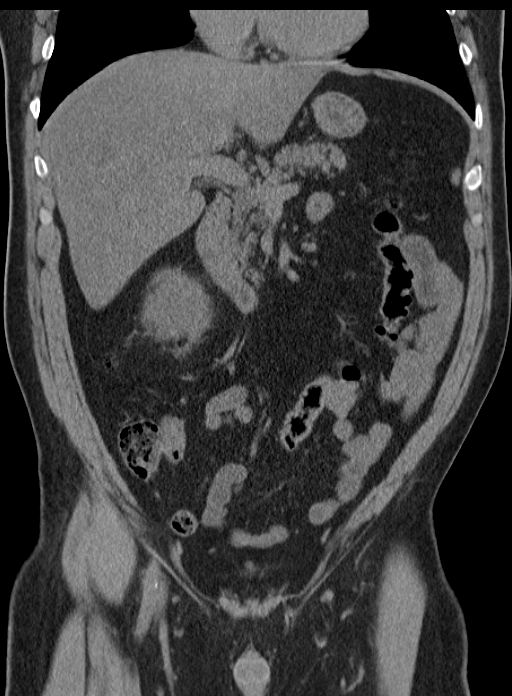
[im 61/110  soft-tissue]
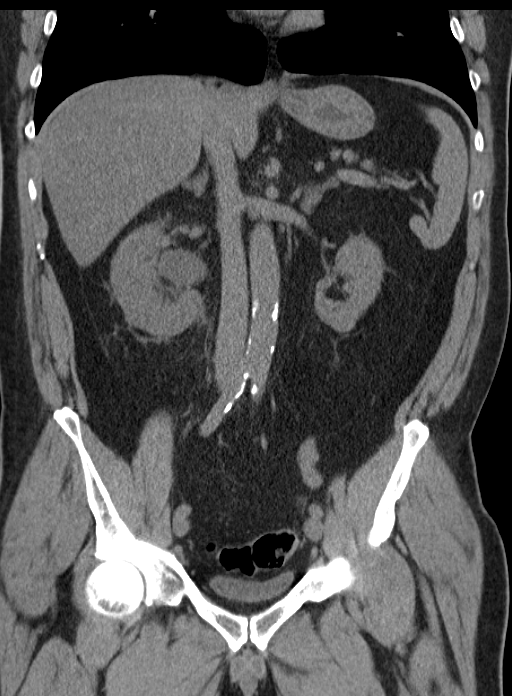

[16 of 46 positions shown; findings below may reference images not displayed]

FINDINGS: Clear lung bases.

Coronary artery atherosclerosis partially visualized.

Multiple calcified gallstones within the gallbladder.  No evidence
of gallbladder wall thickening or inflammatory change.

Moderate right hydroureteronephrosis, secondary to two proximal
right ureteral stones.  These stones are immediately adjacent to
one another.  The largest stone is 6 to 7 mm axial diameter. There
is some perinephric stranding on the right.

In the lower pole collecting system of the right kidney, there is a
5 mm stone.

No left renal or left ureteral stones.  There is no hydronephrosis
on the left.

Bowel loops are normal in caliber and wall thickness.  Normal
appendix.

The unfused appearance of the liver, spleen, adrenal glands, and
pancreas is within normal limits.

There is atherosclerotic calcification of the normal caliber
abdominal aorta.

No lymphadenopathy, free air, or free fluid.  A splenule noted in
left upper quadrant.

Urinary bladder is not very distended, but is unremarkable.  No
bladder stones.  Seminal vesicles prostate gland and inguinal
regions are unremarkable.

No acute or suspicious osseous lesion.
IMPRESSION: 1.  Two proximal right ureteral stones result in proximal moderate
hydroureteronephrosis. The largest stone is 6-7 mm axial diameter.
These stones appear to be visible on the scout view of the CT
study.

2.  Lower pole right renal calculus.
3.  Cholelithiasis.

## 2011-11-11 DIAGNOSIS — I2510 Atherosclerotic heart disease of native coronary artery without angina pectoris: Secondary | ICD-10-CM

## 2011-11-11 HISTORY — DX: Atherosclerotic heart disease of native coronary artery without angina pectoris: I25.10

## 2011-12-19 ENCOUNTER — Encounter: Payer: Self-pay | Admitting: Family Medicine

## 2011-12-19 ENCOUNTER — Ambulatory Visit (INDEPENDENT_AMBULATORY_CARE_PROVIDER_SITE_OTHER): Payer: Managed Care, Other (non HMO) | Admitting: Family Medicine

## 2011-12-19 VITALS — BP 138/83 | HR 59 | Temp 97.8°F | Wt 221.5 lb

## 2011-12-19 DIAGNOSIS — I10 Essential (primary) hypertension: Secondary | ICD-10-CM

## 2011-12-19 DIAGNOSIS — E785 Hyperlipidemia, unspecified: Secondary | ICD-10-CM

## 2011-12-19 DIAGNOSIS — Z23 Encounter for immunization: Secondary | ICD-10-CM

## 2011-12-19 DIAGNOSIS — N529 Male erectile dysfunction, unspecified: Secondary | ICD-10-CM

## 2011-12-19 MED ORDER — SILDENAFIL CITRATE 50 MG PO TABS: 50.0000 mg | ORAL_TABLET | Freq: Every day | ORAL | Status: DC | PRN

## 2011-12-19 NOTE — Progress Notes (Signed)
  Subjective:    Patient ID: Christopher Weeks, male    DOB: [birth year], 61 y.o.   MRN: [id number]  HPI  Doing very well.  Has several minor complaints. Check multiple skin lesions. Has itching of back nightly. Has tremor of Rt. thumb while at rest.  No other neuro symptoms.    Review of Systems     Objective:   Physical Exam HEENT nl Skin - multiple Seb Ks - all benign appearing. Wt and BP noted. Neck supple without masses Lungs clear. Cardiac RRR without m or g Abd benign. Ext. Normal, no edema Neuro, Isolated tremor at rest of Rt. Thumb  Does not involve hand.  No cogwheel or masked faces.        Assessment & Plan:

## 2011-12-19 NOTE — Assessment & Plan Note (Addendum)
Due for lipid panel  Was not taking simvastatin due to cost.  Lipid panel is off all statins.  Will try pravastatin because less expensive

## 2011-12-19 NOTE — Patient Instructions (Addendum)
Please double check your medication list - especially the simvastitin for chol - to make sure I have your meds right. Go back to taking an 81 mg aspirin every day. I sent a new prescription for Viagra to [facility] I will call with your blood work results. Let me know if the tremor gets worse and you want to try a medication for it.

## 2011-12-19 NOTE — Assessment & Plan Note (Signed)
Well controled on current meds 

## 2011-12-19 NOTE — Assessment & Plan Note (Signed)
Desires trial of viagra.

## 2011-12-20 LAB — COMPLETE METABOLIC PANEL WITH GFR
ALT: 51 U/L (ref 0–53)
AST: 37 U/L (ref 0–37)
Albumin: 4.8 g/dL (ref 3.5–5.2)
Alkaline Phosphatase: 49 U/L (ref 39–117)
Calcium: 10.3 mg/dL (ref 8.4–10.5)
Chloride: 100 mEq/L (ref 96–112)
Creat: 0.95 mg/dL (ref 0.50–1.35)
Potassium: 4 mEq/L (ref 3.5–5.3)

## 2011-12-20 LAB — LIPID PANEL
LDL Cholesterol: 179 mg/dL — ABNORMAL HIGH (ref 0–99)
VLDL: 44 mg/dL — ABNORMAL HIGH (ref 0–40)

## 2011-12-22 ENCOUNTER — Encounter: Payer: Self-pay | Admitting: Family Medicine

## 2011-12-22 MED ORDER — PRAVASTATIN SODIUM 40 MG PO TABS: 40.0000 mg | ORAL_TABLET | Freq: Every evening | ORAL | Status: DC

## 2011-12-22 NOTE — Progress Notes (Signed)
Addended by: Tivis Ringer on: 12/22/2011 01:50 PM   Modules accepted: Orders

## 2012-01-14 ENCOUNTER — Telehealth: Payer: Self-pay | Admitting: Family Medicine

## 2012-01-14 DIAGNOSIS — G20 Parkinson's disease: Secondary | ICD-10-CM

## 2012-01-14 DIAGNOSIS — R251 Tremor, unspecified: Secondary | ICD-10-CM

## 2012-01-14 DIAGNOSIS — G20A1 Parkinson's disease without dyskinesia, without mention of fluctuations: Secondary | ICD-10-CM

## 2012-01-14 HISTORY — DX: Parkinson's disease: G20

## 2012-01-14 HISTORY — DX: Parkinson's disease without dyskinesia, without mention of fluctuations: G20.A1

## 2012-01-14 MED ORDER — PROPRANOLOL-HCTZ 80-25 MG PO TABS: 1.0000 | ORAL_TABLET | Freq: Every day | ORAL | Status: DC

## 2012-01-14 NOTE — Assessment & Plan Note (Signed)
See last office visit note.  Could be very early parkinsonism versus benign essential tremor.  Will try B blocker.

## 2012-01-14 NOTE — Telephone Encounter (Signed)
Called and informed.

## 2012-01-14 NOTE — Telephone Encounter (Signed)
Pt was told that the doctor would call in something for twitching in his hand - started out in finger and is now whole hand. [facility]- [address]

## 2012-02-24 ENCOUNTER — Telehealth: Payer: Self-pay | Admitting: Family Medicine

## 2012-02-24 NOTE — Telephone Encounter (Signed)
Will forward this message to Dr. Earnest Bailey in Dr. Cyndia Skeeters absence.

## 2012-02-24 NOTE — Telephone Encounter (Signed)
Pt has been taking the propanolol to help with the shaking of his hand - it has not helped and it is more expensive than the other med he was on.  He will need a refill by Friday if this is to be continued, but if he can go back to his other HCTZ, then he has plenty of that. pls advise   [facility] [address]

## 2012-02-24 NOTE — Telephone Encounter (Signed)
Please advise patient that he can go back to taking HCTZ only (his old medicine) for his blood pressure and since his hand shaking is not improving- to make non-urgent follow-up with Dr. Leveda Anna to discuss further.

## 2012-02-24 NOTE — Telephone Encounter (Signed)
Spoke with wife and message from MD given. Appointment scheduled for 03/12/2012 with Dr. Leveda Anna. Advised wife that I will send message to Dr. Leveda Anna and if he feels he needs to be seen sooner will call back.  He will restart HCTZ and stop propranolol.

## 2012-02-26 ENCOUNTER — Encounter: Payer: Self-pay | Admitting: Family Medicine

## 2012-03-12 ENCOUNTER — Encounter: Payer: Self-pay | Admitting: Family Medicine

## 2012-03-12 ENCOUNTER — Ambulatory Visit (INDEPENDENT_AMBULATORY_CARE_PROVIDER_SITE_OTHER): Payer: Managed Care, Other (non HMO) | Admitting: Family Medicine

## 2012-03-12 VITALS — BP 142/84 | HR 80 | Temp 98.9°F | Ht 72.0 in | Wt 220.0 lb

## 2012-03-12 DIAGNOSIS — R251 Tremor, unspecified: Secondary | ICD-10-CM

## 2012-03-12 DIAGNOSIS — R259 Unspecified abnormal involuntary movements: Secondary | ICD-10-CM

## 2012-03-12 NOTE — Assessment & Plan Note (Signed)
Discussed workup options.  Since progressive, I feel he needs investigation.  Given option of me ordering EMG and nerve conduction or him seeing neuro.  We decided to proceed with neuro referral.

## 2012-03-12 NOTE — Progress Notes (Signed)
  Subjective:    Patient ID: Christopher Weeks, male    DOB: July 27, 1951, 61 y.o.   MRN: [id number]  HPI  Christopher Weeks has worsening Rt hand/forearm tremor.  Began about 4 months ago and is progressive.  Only involves Rt arm.  No other tremor, weakness confusion, headache etc.  He does get some occasional numbness of Rt hand.  He now has some Rt. Elbow pain which he attributes to the tremor.  He has chronic Rt should pain from presumed rotator cuff syndrome.  Shoulder pain onset does not correlate to tremor onset.  Denies neck pain.  Denies balance problem    Review of Systems     Objective:   Physical Exam  Tremor at rest of approximate frequency of parkinsonism.  No left hand problems.  No cogwheel, abnormal gait or masked faces.  The tremor is a rest tremor and not an intension tremor.  His handwriting is preserved.          Assessment & Plan:

## 2012-03-12 NOTE — Patient Instructions (Signed)
My nurse will set you up with a referral to a neurologist.  Please let me know the outcome.

## 2012-04-12 ENCOUNTER — Telehealth: Payer: Self-pay | Admitting: Family Medicine

## 2012-04-12 DIAGNOSIS — R251 Tremor, unspecified: Secondary | ICD-10-CM

## 2012-04-12 NOTE — Telephone Encounter (Signed)
Mrs. Strothman calling to say that patient was dx'd with Parkinson's by provider he was referred to.  Want to discuss this a little more with you.

## 2012-04-12 NOTE — Assessment & Plan Note (Signed)
Called and discussed.  Answered questions and addressed concerns.

## 2012-04-24 ENCOUNTER — Encounter: Payer: Self-pay | Admitting: Family Medicine

## 2012-04-24 NOTE — Progress Notes (Signed)
  Subjective:    Patient ID: Christopher Weeks, male    DOB: 1951-03-15, 61 y.o.   MRN: [id number]  HPI Outside labs from Neuro: CMP OK (SGPT/ALT=58)  TSH=1.1 ANA=negative Vit B nl 804    Review of Systems     Objective:   Physical Exam        Assessment & Plan:

## 2012-07-10 ENCOUNTER — Inpatient Hospital Stay (HOSPITAL_COMMUNITY)
Admission: EM | Admit: 2012-07-10 | Discharge: 2012-07-13 | DRG: 247 | Disposition: A | Discharge status: Home / Self Care | Payer: Managed Care, Other (non HMO) | Attending: Cardiovascular Disease | Admitting: Cardiovascular Disease

## 2012-07-10 ENCOUNTER — Encounter (HOSPITAL_COMMUNITY): Payer: Self-pay | Admitting: *Deleted

## 2012-07-10 ENCOUNTER — Emergency Department (HOSPITAL_COMMUNITY): Payer: Managed Care, Other (non HMO)

## 2012-07-10 DIAGNOSIS — Z885 Allergy status to narcotic agent status: Secondary | ICD-10-CM

## 2012-07-10 DIAGNOSIS — E785 Hyperlipidemia, unspecified: Secondary | ICD-10-CM | POA: Diagnosis present

## 2012-07-10 DIAGNOSIS — I200 Unstable angina: Secondary | ICD-10-CM | POA: Diagnosis present

## 2012-07-10 DIAGNOSIS — G20A1 Parkinson's disease without dyskinesia, without mention of fluctuations: Secondary | ICD-10-CM | POA: Diagnosis present

## 2012-07-10 DIAGNOSIS — Z87891 Personal history of nicotine dependence: Secondary | ICD-10-CM

## 2012-07-10 DIAGNOSIS — G20 Parkinson's disease: Secondary | ICD-10-CM | POA: Diagnosis present

## 2012-07-10 DIAGNOSIS — R079 Chest pain, unspecified: Secondary | ICD-10-CM

## 2012-07-10 DIAGNOSIS — I498 Other specified cardiac arrhythmias: Secondary | ICD-10-CM | POA: Diagnosis present

## 2012-07-10 DIAGNOSIS — I2510 Atherosclerotic heart disease of native coronary artery without angina pectoris: Principal | ICD-10-CM | POA: Diagnosis present

## 2012-07-10 DIAGNOSIS — Z955 Presence of coronary angioplasty implant and graft: Secondary | ICD-10-CM

## 2012-07-10 DIAGNOSIS — I10 Essential (primary) hypertension: Secondary | ICD-10-CM | POA: Diagnosis present

## 2012-07-10 DIAGNOSIS — Z87442 Personal history of urinary calculi: Secondary | ICD-10-CM

## 2012-07-10 HISTORY — DX: Cardiac arrhythmia, unspecified: I49.9

## 2012-07-10 HISTORY — DX: Cardiac murmur, unspecified: R01.1

## 2012-07-10 HISTORY — DX: Calculus of kidney: N20.0

## 2012-07-10 HISTORY — DX: Essential (primary) hypertension: I10

## 2012-07-10 HISTORY — DX: Parkinson's disease: G20

## 2012-07-10 LAB — COMPREHENSIVE METABOLIC PANEL
BUN: 20 mg/dL (ref 6–23)
CO2: 29 mEq/L (ref 19–32)
Calcium: 10.1 mg/dL (ref 8.4–10.5)
Creatinine, Ser: 1.01 mg/dL (ref 0.50–1.35)
GFR calc Af Amer: >90 mL/min (ref >90)
GFR calc non Af Amer: 79 mL/min — ABNORMAL LOW (ref >90)
Glucose, Bld: 110 mg/dL — ABNORMAL HIGH (ref 70–99)
Sodium: 140 mEq/L (ref 135–145)
Total Protein: 7.4 g/dL (ref 6.0–8.3)

## 2012-07-10 LAB — CBC WITH DIFFERENTIAL/PLATELET
Eosinophils Absolute: 0.1 10*3/uL (ref 0.0–0.7)
Eosinophils Relative: 1 % (ref 0–5)
HCT: 41.6 % (ref 39.0–52.0)
Lymphs Abs: 1.4 10*3/uL (ref 0.7–4.0)
MCH: 33 pg (ref 26.0–34.0)
MCV: 90.4 fL (ref 78.0–100.0)
Monocytes Absolute: 0.7 10*3/uL (ref 0.1–1.0)
Platelets: 184 10*3/uL (ref 150–400)
RBC: 4.6 MIL/uL (ref 4.22–5.81)

## 2012-07-10 LAB — CBC
HCT: 43.3 % (ref 39.0–52.0)
MCH: 32.5 pg (ref 26.0–34.0)
MCV: 90.8 fL (ref 78.0–100.0)
RDW: 13.1 % (ref 11.5–15.5)
WBC: 8.3 10*3/uL (ref 4.0–10.5)

## 2012-07-10 LAB — TROPONIN I
Troponin I: <0.3 ng/mL (ref <0.30)
Troponin I: <0.3 ng/mL (ref <0.30)

## 2012-07-10 LAB — CREATININE, SERUM: GFR calc Af Amer: >90 mL/min (ref >90)

## 2012-07-10 LAB — PROTIME-INR
INR: 0.94 (ref 0.00–1.49)
Prothrombin Time: 12.8 seconds (ref 11.6–15.2)

## 2012-07-10 LAB — TSH: TSH: 1.384 u[IU]/mL (ref 0.350–4.500)

## 2012-07-10 MED ORDER — ASPIRIN EC 81 MG PO TBEC
81.0000 mg | DELAYED_RELEASE_TABLET | Freq: Every day | ORAL | Status: DC
  Administered 2012-07-11: 81 mg via ORAL
  Filled 2012-07-10: qty 1

## 2012-07-10 MED ORDER — MAGNESIUM OXIDE 400 MG PO TABS: 400.0000 mg | ORAL_TABLET | Freq: Two times a day (BID) | ORAL | Status: DC

## 2012-07-10 MED ORDER — HYDROCHLOROTHIAZIDE 25 MG PO TABS
25.0000 mg | ORAL_TABLET | Freq: Every day | ORAL | Status: DC
  Administered 2012-07-11 – 2012-07-13 (×3): 25 mg via ORAL
  Filled 2012-07-10 (×3): qty 1

## 2012-07-10 MED ORDER — CARBIDOPA-LEVODOPA 25-100 MG PO TABS
1.5000 | ORAL_TABLET | Freq: Three times a day (TID) | ORAL | Status: DC
  Administered 2012-07-10 – 2012-07-13 (×9): 1.5 via ORAL
  Filled 2012-07-10 (×14): qty 1.5

## 2012-07-10 MED ORDER — ASPIRIN 81 MG PO CHEW: 243.0000 mg | CHEWABLE_TABLET | Freq: Once | ORAL | Status: DC

## 2012-07-10 MED ORDER — MAGNESIUM OXIDE 400 (241.3 MG) MG PO TABS
400.0000 mg | ORAL_TABLET | Freq: Two times a day (BID) | ORAL | Status: DC
  Administered 2012-07-10 – 2012-07-13 (×6): 400 mg via ORAL
  Filled 2012-07-10 (×8): qty 1

## 2012-07-10 MED ORDER — HEPARIN BOLUS VIA INFUSION
4000.0000 [IU] | Freq: Once | INTRAVENOUS | Status: AC
  Administered 2012-07-10: 4000 [IU] via INTRAVENOUS
  Filled 2012-07-10: qty 4000

## 2012-07-10 MED ORDER — ACETAMINOPHEN 325 MG PO TABS
650.0000 mg | ORAL_TABLET | ORAL | Status: DC | PRN
  Administered 2012-07-10 – 2012-07-12 (×3): 650 mg via ORAL
  Filled 2012-07-10 (×3): qty 2

## 2012-07-10 MED ORDER — POTASSIUM CHLORIDE CRYS ER 20 MEQ PO TBCR
40.0000 meq | EXTENDED_RELEASE_TABLET | Freq: Once | ORAL | Status: AC
  Administered 2012-07-10: 40 meq via ORAL
  Filled 2012-07-10: qty 2

## 2012-07-10 MED ORDER — DIPHENHYDRAMINE HCL 25 MG PO CAPS
50.0000 mg | ORAL_CAPSULE | Freq: Once | ORAL | Status: AC
  Administered 2012-07-10: 25 mg via ORAL
  Filled 2012-07-10: qty 2
  Filled 2012-07-10: qty 1

## 2012-07-10 MED ORDER — HEPARIN SODIUM (PORCINE) 5000 UNIT/ML IJ SOLN
5000.0000 [IU] | Freq: Three times a day (TID) | INTRAMUSCULAR | Status: DC
  Filled 2012-07-10 (×3): qty 1

## 2012-07-10 MED ORDER — METOPROLOL TARTRATE 12.5 MG HALF TABLET
12.5000 mg | ORAL_TABLET | Freq: Two times a day (BID) | ORAL | Status: DC
  Administered 2012-07-10 – 2012-07-13 (×6): 12.5 mg via ORAL
  Filled 2012-07-10 (×9): qty 1

## 2012-07-10 MED ORDER — HEPARIN BOLUS VIA INFUSION
3000.0000 [IU] | Freq: Once | INTRAVENOUS | Status: AC
Start: 2012-07-10 — End: 2012-07-11
  Administered 2012-07-11: 3000 [IU] via INTRAVENOUS
  Filled 2012-07-10: qty 3000

## 2012-07-10 MED ORDER — HEPARIN (PORCINE) IN NACL 100-0.45 UNIT/ML-% IJ SOLN
1950.0000 [IU]/h | INTRAMUSCULAR | Status: DC
  Administered 2012-07-10: 1700 [IU]/h via INTRAVENOUS
  Administered 2012-07-10: 1300 [IU]/h via INTRAVENOUS
  Administered 2012-07-11 (×2): 1950 [IU]/h via INTRAVENOUS
  Administered 2012-07-11: 1700 [IU]/h via INTRAVENOUS
  Filled 2012-07-10 (×5): qty 250

## 2012-07-10 MED ORDER — ONDANSETRON HCL 4 MG/2ML IJ SOLN: 4.0000 mg | Freq: Four times a day (QID) | INTRAMUSCULAR | Status: DC | PRN

## 2012-07-10 MED ORDER — NITROGLYCERIN IN D5W 200-5 MCG/ML-% IV SOLN
10.0000 ug/min | INTRAVENOUS | Status: DC
  Administered 2012-07-10: 5 ug/min via INTRAVENOUS
  Filled 2012-07-10: qty 250

## 2012-07-10 MED ORDER — SIMVASTATIN 20 MG PO TABS
20.0000 mg | ORAL_TABLET | Freq: Every day | ORAL | Status: DC
  Administered 2012-07-10 – 2012-07-11 (×2): 20 mg via ORAL
  Filled 2012-07-10 (×4): qty 1

## 2012-07-10 NOTE — H&P (Signed)
Christopher Weeks is an 61 y.o. male.   Chief Complaint:  Chest Pain HPI:   Patient is a 61 year old Caucasian male with history of hypertension kidney stones, Parkinson's disease, hyperlipidemia and remote tobacco history. He states he quit smoking approximately 15 years ago and prior to have a 15 pack year history. Patient presents with progressive chest pain.  He describes his pain as "pressing".  He states that over the last month and a half he's been having exertional angina at least one time per day this is progressed to 2 times per day. Has woken him up from sleep this past Thursday of 4:30 hours. This morning he had 4 different episodes which lasted 2-3 minutes with minimal exertion. He notes associated nausea, dizziness, diaphoresis and left arm left and right arm numbness.    According to his wife they made some dietary changes in the last 6 months he has lost approximately 25 pounds.  He denies vomiting, fever, shortness of breath, palpitations, orthopnea, PND, abdominal pain, lower extremity edema, dysuria, hematuria, hematochezia, melena, cough, congestion, sore throat.  Past Medical History  Diagnosis Date  . Parkinson disease     History reviewed. No pertinent past surgical history.  History reviewed. No pertinent family history. Social History:  reports that he quit smoking about 21 years ago. His smoking use included Cigarettes. He has a 32 pack-year smoking history. He has never used smokeless tobacco. He reports that he does not drink alcohol or use illicit drugs.  Family history:  The patient is adopted.  Allergies:  Allergies  Allergen Reactions  . Codeine Hives     (Not in a hospital admission)  Results for orders placed during the hospital encounter of 07/10/12 (from the past 48 hour(s))  CBC WITH DIFFERENTIAL     Status: Abnormal   Collection Time   07/10/12 11:00 AM      Component Value Range Comment   WBC 8.2  4.0 - 10.5 K/uL    RBC 4.60  4.22 - 5.81 MIL/uL    Hemoglobin 15.2  13.0 - 17.0 g/dL    HCT 19.1  47.8 - 29.5 %    MCV 90.4  78.0 - 100.0 fL    MCH 33.0  26.0 - 34.0 pg    MCHC 36.5 (*) 30.0 - 36.0 g/dL    RDW 62.1  30.8 - 65.7 %    Platelets 184  150 - 400 K/uL    Neutrophils Relative 73  43 - 77 %    Neutro Abs 5.9  1.7 - 7.7 K/uL    Lymphocytes Relative 17  12 - 46 %    Lymphs Abs 1.4  0.7 - 4.0 K/uL    Monocytes Relative 9  3 - 12 %    Monocytes Absolute 0.7  0.1 - 1.0 K/uL    Eosinophils Relative 1  0 - 5 %    Eosinophils Absolute 0.1  0.0 - 0.7 K/uL    Basophils Relative 0  0 - 1 %    Basophils Absolute 0.0  0.0 - 0.1 K/uL   COMPREHENSIVE METABOLIC PANEL     Status: Abnormal   Collection Time   07/10/12 11:00 AM      Component Value Range Comment   Sodium 140  135 - 145 mEq/L    Potassium 3.4 (*) 3.5 - 5.1 mEq/L    Chloride 102  96 - 112 mEq/L    CO2 29  19 - 32 mEq/L    Glucose, Bld  110 (*) 70 - 99 mg/dL    BUN 20  6 - 23 mg/dL    Creatinine, Ser 1.61  0.50 - 1.35 mg/dL    Calcium 09.6  8.4 - 10.5 mg/dL    Total Protein 7.4  6.0 - 8.3 g/dL    Albumin 4.3  3.5 - 5.2 g/dL    AST 33  0 - 37 U/L    ALT 13  0 - 53 U/L    Alkaline Phosphatase 44  39 - 117 U/L    Total Bilirubin 0.4  0.3 - 1.2 mg/dL    GFR calc non Af Amer 79 (*) >90 mL/min    GFR calc Af Amer >90  >90 mL/min   TROPONIN I     Status: Normal   Collection Time   07/10/12 11:01 AM      Component Value Range Comment   Troponin I <0.30  <0.30 ng/mL    Dg Chest Port 1 View  07/10/2012  *RADIOLOGY REPORT*  Clinical Data: Chest pain intermittently over the past 4 weeks. Numbness.  PORTABLE CHEST - 1 VIEW  Comparison: None.  Findings: Numerous leads and wires project over the chest.  Midline trachea.  Borderline cardiomegaly.     Mediastinal contours otherwise within normal limits.  Mild apical lordotic patient positioning. No pleural effusion or pneumothorax.  Clear lungs.  IMPRESSION: Borderline cardiomegaly, without acute disease.   Original Report Authenticated  By: Consuello Bossier, M.D.     Review of Systems  Constitutional: Positive for weight loss (25 pounds in the last 6 months) and diaphoresis. Negative for fever.  HENT: Negative for congestion and sore throat.   Eyes: Negative for blurred vision and double vision.  Respiratory: Negative for cough, shortness of breath and wheezing.   Cardiovascular: Positive for chest pain. Negative for palpitations, orthopnea, claudication, leg swelling and PND.  Gastrointestinal: Positive for nausea. Negative for vomiting, abdominal pain, diarrhea, constipation, blood in stool and melena.  Genitourinary: Negative for dysuria and hematuria.  Musculoskeletal: Positive for myalgias (Bilateral arm during the CP).  Neurological: Positive for dizziness. Negative for headaches.    Blood pressure 148/101, pulse 60, temperature 97.9 F (36.6 C), temperature source Oral, resp. rate 16, SpO2 98.00%. Physical Exam  Constitutional: He is oriented to person, place, and time. He appears well-developed and well-nourished. No distress.  HENT:  Head: Normocephalic and atraumatic.  Mouth/Throat: No oropharyngeal exudate.  Eyes: EOM are normal. Pupils are equal, round, and reactive to light. No scleral icterus.  Neck: Normal range of motion. Neck supple. No JVD present.  Cardiovascular: Normal rate, regular rhythm, S1 normal and S2 normal.   No murmur heard. Pulses:      Radial pulses are 2+ on the right side, and 2+ on the left side.       Dorsalis pedis pulses are 2+ on the right side, and 0 on the left side.       Posterior tibial pulses are 2+ on the right side, and 2+ on the left side.       No carotid or femoral bruit.  Respiratory: Effort normal and breath sounds normal. He has no wheezes. He has no rales.  GI: Soft. Bowel sounds are normal. He exhibits no distension. There is no tenderness.  Musculoskeletal: He exhibits no edema.  Lymphadenopathy:    He has no cervical adenopathy.  Neurological: He is alert and  oriented to person, place, and time. He exhibits normal muscle tone.  Skin: Skin is warm  and dry.  Psychiatric: He has a normal mood and affect.     Assessment/Plan Patient Active Hospital Problem List: Unstable angina (07/10/2012) HYPERLIPIDEMIA (01/07/2007) Essential hypertension, benign (03/14/2009) Parkinson's disease (01/14/2012)  Plan:  61 year old Caucasian male with remote tobacco history and hypertension and hyperlipidemia presenting with progressive unstable angina.   Patient will be admitted to the telemetry unit. Start IV nitroglycerin. We'll check a TSH, lipid panel, serial cardiac enzymes, A1C. We'll add a beta blocker and statin. Lower 2-D echocardiogram. He will need a left heart catheterization Tuesday.     HAGER, BRYAN 07/10/2012, 12:39 PM   I have seen and examined the patient along with Wilburt Finlay, PA.  I have reviewed the chart, notes and new data.  I agree with PA's note.  Symptoms are highly suggestive of unstable angina with rapid acceleration and now angina at rest. No ECG or enzyme leak, but worrisome symptoms.  He does have a systolic 2/6 murmur at th right upper sternal border but no other signs of significant AS. He has resting tremor limited to the right hand.   PLAN: IV heparin and NTG. Coronary angio as soon as possible. Check echo due to murmur.  Thurmon Fair, MD, [facility] [phone] 07/10/2012, 1:26 PM

## 2012-07-10 NOTE — Progress Notes (Signed)
ANTICOAGULATION CONSULT NOTE - Follow Up Consult  Pharmacy Consult for heparin Indication: chest pain/ACS  Labs:  Basename 07/10/12 2225 07/10/12 2039 07/10/12 1650 07/10/12 1509 07/10/12 1101 07/10/12 1100  HGB -- -- -- 15.5 -- 15.2  HCT -- -- -- 43.3 -- 41.6  PLT -- -- -- 203 -- 184  APTT -- -- -- -- -- --  LABPROT -- -- 12.8 -- -- --  INR -- -- 0.94 -- -- --  HEPARINUNFRC <0.10* -- -- -- -- --  CREATININE -- -- -- 0.96 -- 1.01  CKTOTAL -- -- -- -- -- --  CKMB -- -- -- -- -- --  TROPONINI -- <0.30 -- <0.30 <0.30 --    Assessment: 61yo male undetectable on heparin with initial dosing for CP.  Goal of Therapy:  Heparin level 0.3-0.7 units/ml   Plan:  Will give another heparin bolus of 3000 units x1 and increase gtt by 4 units/kg/hr to 1700 units/hr and check level in 6hr.  Colleen Can PharmD BCPS 07/10/2012,11:46 PM

## 2012-07-10 NOTE — Progress Notes (Signed)
ANTICOAGULATION CONSULT NOTE - Initial Consult  Pharmacy Consult for Heparin Indication: chest pain/ACS  Allergies  Allergen Reactions  . Codeine Hives    Patient Measurements:  Weight (patient reported): 100kg Heparin Dosing Weight: 98kg  Vital Signs: Temp: 97.9 F (36.6 C) (08/31 1027) Temp src: Oral (08/31 1027) BP: 141/87 mmHg (08/31 1330) Pulse Rate: 64  (08/31 1330)  Labs:  Basename 07/10/12 1509 07/10/12 1101 07/10/12 1100  HGB 15.5 -- 15.2  HCT 43.3 -- 41.6  PLT 203 -- 184  APTT -- -- --  LABPROT -- -- --  INR -- -- --  HEPARINUNFRC -- -- --  CREATININE -- -- 1.01  CKTOTAL -- -- --  CKMB -- -- --  TROPONINI -- <0.30 --    The CrCl is unknown because both a height and weight (above a minimum accepted value) are required for this calculation.   Medical History: Past Medical History  Diagnosis Date  . Parkinson disease     Medications:  Prescriptions prior to admission  Medication Sig Dispense Refill  . Ascorbic Acid (VITAMIN C) 500 MG tablet Take 500 mg by mouth daily.        Marland Kitchen aspirin 81 MG tablet Take 81 mg by mouth daily.        . Calcium-Vitamin D 600-125 MG-UNIT TABS 1 by mouth daily       . carbidopa-levodopa (SINEMET IR) 25-100 MG per tablet Take 1.5 tablets by mouth 3 (three) times daily.      . hydrochlorothiazide (HYDRODIURIL) 25 MG tablet Take 25 mg by mouth daily.      . magnesium oxide (MAG-OX) 400 MG tablet Take 400 mg by mouth 2 (two) times daily.      . Multiple Vitamin (MULTIVITAMIN) tablet Take 1 tablet by mouth daily.        . Omega-3 350 MG CAPS 2 by mouth daily       . pravastatin (PRAVACHOL) 40 MG tablet Take 1 tablet (40 mg total) by mouth every evening.  90 tablet  3    Assessment: Christopher Weeks to start heparin for CP/USA. Patient reports no bleeding and not taking any anticoagulants.  - Baseline INR ordered - H/H and Plts wnl - Heparin weight: 98kg  Goal of Therapy:  Heparin level 0.3-0.7 units/ml Monitor platelets by  anticoagulation protocol: Yes   Plan:  1. Heparin IV bolus 4000 units x 1 2. Heparin drip 1300 units/hr (13 ml/hr) 3. Check heparin level 6 hours after heparin initiation 4. Dailly heparin level and CBC 5. INR now  Cleon Dew [phone] 07/10/2012,4:11 PM

## 2012-07-10 NOTE — ED Notes (Signed)
Pt. Has had chest pain off and on for the past 3 days. Pt. Just started recently taking medicine for Parkinson and believes that is when he started having pains.  Pt. reports that pain was the worst he has had ever.  Pt. Did not have SOB, paleness, or being diaphoresis.

## 2012-07-10 NOTE — ED Provider Notes (Signed)
History     CSN: [id number]  Arrival date & time 07/10/12  1023   First MD Initiated Contact with Patient 07/10/12 1029      Chief Complaint  Patient presents with  . Chest Pain    (Consider location/radiation/quality/duration/timing/severity/associated sxs/prior treatment) HPI Comments: Patient with history of history of high cholesterol, Parkinson's Dz.  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  He describes this as a tightness that radiates into both arms and causes him to feel short of breath and sweaty.  It seemed to resolve in the ambulance with oxygen and aspirin.    He reports to me that he has had several other episodes similar to this over the past two months, each time occurring with exertion and resolved with rest.  He also reports he was started on medication for Parkinson's shortly prior to the onset of these symptoms.    Patient is a 61 y.o. male presenting with chest pain. The history is provided by the patient.  Chest Pain The chest pain began less than 1 hour ago. Duration of episode(s) is 5 minutes. Chest pain occurs constantly. The chest pain is resolved. The pain is associated with exertion. The severity of the pain is moderate. The quality of the pain is described as tightness. Radiates to: both arms. Primary symptoms include shortness of breath and nausea. Pertinent negatives for primary symptoms include no fever, no cough and no palpitations. He tried nothing for the symptoms.     Past Medical History  Diagnosis Date  . Parkinson disease     History reviewed. No pertinent past surgical history.  History reviewed. No pertinent family history.  History  Substance Use Topics  . Smoking status: Former Smoker -- 1.0 packs/day for 32 years    Types: Cigarettes    Quit date: 12/18/1990  . Smokeless tobacco: Never Used  . Alcohol Use: No      Review of Systems  Constitutional: Negative for fever.   Respiratory: Positive for shortness of breath. Negative for cough.   Cardiovascular: Positive for chest pain. Negative for palpitations.  Gastrointestinal: Positive for nausea.  All other systems reviewed and are negative.    Allergies  Codeine  Home Medications   Current Outpatient Rx  Name Route Sig Dispense Refill  . VITAMIN C 500 MG PO TABS Oral Take 500 mg by mouth daily.      . ASPIRIN 81 MG PO TABS Oral Take 81 mg by mouth daily.      Marland Kitchen CALCIUM-VITAMIN D 600-125 MG-UNIT PO TABS  1 by mouth daily     . FOLIC ACID 400 MCG PO TABS Oral Take 400 mcg by mouth daily.      Marland Kitchen HYDROCHLOROTHIAZIDE 25 MG PO TABS Oral Take 25 mg by mouth daily.    Marland Kitchen ONE-DAILY MULTI VITAMINS PO TABS Oral Take 1 tablet by mouth daily.      . OMEGA-3 350 MG PO CAPS  2 by mouth daily     . PRAVASTATIN SODIUM 40 MG PO TABS Oral Take 1 tablet (40 mg total) by mouth every evening. 90 tablet 3  . SILDENAFIL CITRATE 50 MG PO TABS Oral Take 1 tablet (50 mg total) by mouth daily as needed for erectile dysfunction. 10 tablet 6  . ZINC GLUCONATE 50 MG PO TABS  1 by mouth daily       BP 148/101  Pulse 60  Temp 97.9 F (36.6 C) (  Oral)  Resp 16  SpO2 98%  Physical Exam  Nursing note and vitals reviewed. Constitutional: He is oriented to person, place, and time. He appears well-developed and well-nourished. No distress.  HENT:  Head: Normocephalic and atraumatic.  Mouth/Throat: Oropharynx is clear and moist.  Neck: Normal range of motion. Neck supple.  Cardiovascular: Normal rate and regular rhythm.   No murmur heard. Pulmonary/Chest: Effort normal and breath sounds normal. No respiratory distress. He has no wheezes.  Abdominal: Soft. Bowel sounds are normal. He exhibits no distension. There is no tenderness.  Musculoskeletal: Normal range of motion. He exhibits no edema.  Neurological: He is alert and oriented to person, place, and time.  Skin: Skin is warm and dry. He is not diaphoretic.    ED Course    Procedures (including critical care time)   Labs Reviewed  CBC WITH DIFFERENTIAL  COMPREHENSIVE METABOLIC PANEL  TROPONIN I   No results found.   No diagnosis found.   Date: 07/10/2012  Rate: 56  Rhythm: normal sinus rhythm  QRS Axis: normal  Intervals: normal  ST/T Wave abnormalities: normal  Conduction Disutrbances:none  Narrative Interpretation:   Old EKG Reviewed: none available    MDM  The patient presents with what sounds like an accelerating anginal pattern.  The workup has thus far been unremarkable, however the patient's history is concerning.  I have consulted [facility] who will see and admit the patient for observation and further workup.        Geoffery Lyons, MD 07/10/12 1308

## 2012-07-11 ENCOUNTER — Encounter (HOSPITAL_COMMUNITY)
Admission: EM | Disposition: A | Discharge status: Home / Self Care | Payer: Self-pay | Source: Home / Self Care | Attending: Cardiovascular Disease

## 2012-07-11 DIAGNOSIS — I2510 Atherosclerotic heart disease of native coronary artery without angina pectoris: Secondary | ICD-10-CM

## 2012-07-11 HISTORY — PX: LEFT HEART CATH: SHX5478

## 2012-07-11 HISTORY — PX: CORONARY STENT PLACEMENT: SHX1402

## 2012-07-11 HISTORY — PX: CARDIAC CATHETERIZATION: SHX172

## 2012-07-11 HISTORY — PX: CORONARY ANGIOPLASTY: SHX604

## 2012-07-11 HISTORY — PX: PERCUTANEOUS CORONARY STENT INTERVENTION (PCI-S): SHX5485

## 2012-07-11 LAB — BASIC METABOLIC PANEL
BUN: 18 mg/dL (ref 6–23)
Creatinine, Ser: 0.97 mg/dL (ref 0.50–1.35)
GFR calc Af Amer: >90 mL/min (ref >90)
GFR calc non Af Amer: 89 mL/min — ABNORMAL LOW (ref >90)

## 2012-07-11 LAB — CBC
HCT: 41 % (ref 39.0–52.0)
MCH: 33 pg (ref 26.0–34.0)
MCHC: 35.9 g/dL (ref 30.0–36.0)
RDW: 13.2 % (ref 11.5–15.5)

## 2012-07-11 LAB — LIPID PANEL
Cholesterol: 235 mg/dL — ABNORMAL HIGH (ref 0–200)
LDL Cholesterol: 158 mg/dL — ABNORMAL HIGH (ref 0–99)
Triglycerides: 203 mg/dL — ABNORMAL HIGH (ref <150)

## 2012-07-11 LAB — TROPONIN I: Troponin I: <0.3 ng/mL (ref <0.30)

## 2012-07-11 LAB — MRSA PCR SCREENING: MRSA by PCR: NEGATIVE

## 2012-07-11 SURGERY — LEFT HEART CATH
Anesthesia: LOCAL | Site: Hand | Laterality: Right

## 2012-07-11 MED ORDER — PRASUGREL HCL 10 MG PO TABS
10.0000 mg | ORAL_TABLET | Freq: Every day | ORAL | Status: DC
  Administered 2012-07-12 – 2012-07-13 (×2): 10 mg via ORAL
  Filled 2012-07-11 (×2): qty 1

## 2012-07-11 MED ORDER — POTASSIUM CHLORIDE CRYS ER 20 MEQ PO TBCR
EXTENDED_RELEASE_TABLET | ORAL | Status: AC
  Administered 2012-07-11: 10:00:00
  Filled 2012-07-11: qty 2

## 2012-07-11 MED ORDER — ASPIRIN 81 MG PO CHEW
81.0000 mg | CHEWABLE_TABLET | Freq: Every day | ORAL | Status: DC
  Administered 2012-07-12 – 2012-07-13 (×2): 81 mg via ORAL
  Filled 2012-07-11 (×2): qty 1

## 2012-07-11 MED ORDER — HEPARIN BOLUS VIA INFUSION
3000.0000 [IU] | Freq: Once | INTRAVENOUS | Status: AC
  Administered 2012-07-11: 3000 [IU] via INTRAVENOUS
  Filled 2012-07-11: qty 3000

## 2012-07-11 MED ORDER — SODIUM CHLORIDE 0.9 % IV SOLN: INTRAVENOUS | Status: DC

## 2012-07-11 MED ORDER — SODIUM CHLORIDE 0.9 % IV SOLN: 250.0000 mL | INTRAVENOUS | Status: DC | PRN

## 2012-07-11 MED ORDER — PRASUGREL HCL 10 MG PO TABS
ORAL_TABLET | ORAL | Status: AC
  Filled 2012-07-11: qty 6

## 2012-07-11 MED ORDER — NITROGLYCERIN 0.2 MG/ML ON CALL CATH LAB
INTRAVENOUS | Status: AC
  Filled 2012-07-11: qty 1

## 2012-07-11 MED ORDER — SODIUM CHLORIDE 0.9 % IJ SOLN: 3.0000 mL | INTRAMUSCULAR | Status: DC | PRN

## 2012-07-11 MED ORDER — SODIUM CHLORIDE 0.9 % IJ SOLN: 3.0000 mL | Freq: Two times a day (BID) | INTRAMUSCULAR | Status: DC

## 2012-07-11 MED ORDER — HEPARIN SODIUM (PORCINE) 1000 UNIT/ML IJ SOLN
INTRAMUSCULAR | Status: AC
  Filled 2012-07-11: qty 1

## 2012-07-11 MED ORDER — LIDOCAINE HCL (PF) 1 % IJ SOLN
INTRAMUSCULAR | Status: AC
  Filled 2012-07-11: qty 30

## 2012-07-11 MED ORDER — POTASSIUM CHLORIDE CRYS ER 20 MEQ PO TBCR
40.0000 meq | EXTENDED_RELEASE_TABLET | Freq: Once | ORAL | Status: AC
  Administered 2012-07-11: 40 meq via ORAL

## 2012-07-11 MED ORDER — ASPIRIN 81 MG PO CHEW
CHEWABLE_TABLET | ORAL | Status: AC
  Administered 2012-07-11: 324 mg
  Filled 2012-07-11: qty 3

## 2012-07-11 MED ORDER — DIPHENHYDRAMINE HCL 25 MG PO CAPS
25.0000 mg | ORAL_CAPSULE | Freq: Once | ORAL | Status: AC
  Administered 2012-07-11: 25 mg via ORAL
  Filled 2012-07-11: qty 1

## 2012-07-11 MED ORDER — HEPARIN (PORCINE) IN NACL 2-0.9 UNIT/ML-% IJ SOLN
INTRAMUSCULAR | Status: AC
  Filled 2012-07-11: qty 2000

## 2012-07-11 MED ORDER — FENTANYL CITRATE 0.05 MG/ML IJ SOLN
INTRAMUSCULAR | Status: AC
  Filled 2012-07-11: qty 2

## 2012-07-11 MED ORDER — BIVALIRUDIN 250 MG IV SOLR
INTRAVENOUS | Status: AC
  Filled 2012-07-11: qty 250

## 2012-07-11 MED ORDER — MIDAZOLAM HCL 2 MG/2ML IJ SOLN
INTRAMUSCULAR | Status: AC
  Filled 2012-07-11: qty 2

## 2012-07-11 MED ORDER — SODIUM CHLORIDE 0.9 % IV SOLN: INTRAVENOUS | Status: AC

## 2012-07-11 MED ORDER — MORPHINE SULFATE 2 MG/ML IJ SOLN
INTRAMUSCULAR | Status: AC
  Administered 2012-07-11: 2 mg
  Filled 2012-07-11: qty 1

## 2012-07-11 MED ORDER — VERAPAMIL HCL 2.5 MG/ML IV SOLN
INTRAVENOUS | Status: AC
  Filled 2012-07-11: qty 2

## 2012-07-11 NOTE — Progress Notes (Signed)
Pt. Seen and examined. Agree with the NP/PA-C note as written.  While examining him this morning he developed 10/10 chest pain, which is SS and does not radiate. He was in near tears. He is mildly diaphoretic and nauseated. Nitroglycerin was increased and he has mild improvement. An EKG was performed which shows no evidence of ischemia or ST elevation. He received 2 mg of morphine and his pain was improved. His admission complaints are concerning for unstable angina, however, cardiac markers are negative. While this could be unstable angina, I'm surprised to not even see EKG changes. Given his recurrent symptoms despite medical therapy, I feel he should have a diagnostic cardiac catheterization sooner than later to r/o significant obstruction. If negative, he may likely have esophageal spasm - he did report a few episodes of food getting stuck when swallowing recently, including one episode where he made himself vomit and almost passed out. He has equal pulses bilaterally in the upper extremities and a narrow mediastinum, therefore, I think aortic dissection is fairly unlikely.  I have discussed with Dr. Royann Shivers and he agrees on an early invasive approach.  The patient is in agreement with this as well.  Chrystie Nose, MD, [facility] Attending Cardiologist [facility]

## 2012-07-11 NOTE — Progress Notes (Signed)
Pt c/o 8/10 cp, nitro titrated to , 2mg  iv morphine given with relief, ekg negative for any changes, pt prepped and transferred to cath lab.

## 2012-07-11 NOTE — Progress Notes (Signed)
Chaplain Note: Responded to a stemi-cath page at 10:27am.  Patient was being prepped for surgery, to be commenced approximately 1/2 hour or so (per Dr. Rennis Golden) to take perhaps an hour for the surgery (with no other issues present) and the wife is to be present about 11am per nurse.  Will check around that time, if not otherwise occupied to check on wife in [address] area, where surgery will take place.  Doctor mentioned determining what is causing patient's chest pains.  Rema Jasmine, Chaplain [phone]

## 2012-07-11 NOTE — CV Procedure (Signed)
CARDIAC CATHETERIZATION REPORT   Procedures performed:  1. Left heart catheterization  2. Selective coronary angiography  3. Left ventriculography   Reason for procedure:  Unstable angina pectoris  Procedure performed by: Thurmon Fair, MD, [facility]  Complications: none   Estimated blood loss: less than 5 mL   History:  61 year old man without previous cardiac or other serious medical problems who presents with symptoms of classic exertional angina that then progressed in an accelerated fashion to unstable angina including angina at rest. The morning of the procedure the patient had recurrent episodes of chest pain at rest or on a temporarily relieved with sublingual nitroglycerin and intravenous nitroglycerin. The procedures performed in an urgent fashion.  Consent: The risks, benefits, and details of the procedure were explained to the patient. Risks including death, MI, stroke, bleeding, limb ischemia, renal failure and allergy were described and accepted by the patient. Informed written consent was obtained prior to proceeding.  Technique: The patient was brought to the cardiac catheterization laboratory in the fasting state. He was prepped and draped in the usual sterile fashion. Local anesthesia with 1% lidocaine was administered to the right wrist area. Using the modified Seldinger technique a 5 French right radial artery sheath was introduced without difficulty. Under fluoroscopic guidance, using 5 French TIG and angled pigtail catheters, selective cannulation of the left coronary artery, right coronary artery and left ventricle were respectively performed. Several coronary angiograms in a variety of projections were recorded, as well as a left ventriculogram in the RAO projection. Left ventricular pressure and a pull back to the aorta were recorded. No immediate complications occurred. At the end of the procedure, all catheters were removed. After the procedure, hemostasis will be achieved  with manual pressure.  Contrast used: 90 mL Omnipaque  Angiographic Findings:  1. The left main coronary artery is free of significant atherosclerosis and bifurcates in the usual fashion into the left anterior descending artery and left circumflex coronary artery.  2. The left anterior descending artery is a large vessel that reaches the apex and generates two major diagonal branches. The first diagonal artery smaller but has a high-grade probably 80% right-sided stenosis in its proximal third. The vessel appears to be no more than 1.5 mm in diameter. There is evidence of mild luminal irregularities and no calcification in the remainder of the LAD system. No other hemodynamically meaningful stenoses are seen. 3. The left circumflex coronary artery is a relatively small-size vessel non- dominant vessel that generates 1 major oblique marginal artery. There is evidence of extensive luminal irregularities and minimal calcification. There is a long proximal 60-70% lesion that appears smooth but no other hemodynamically meaningful stenoses are seen. 4. The right coronary artery is a very large-size dominant vessel that generates a medium-size posterior lateral ventricular system as well as a very long and large caliber posterior descending artery. There is evidence of extensive luminal irregularities and minute calcification. 2 sequential stenoses are seen in the mid right coronary artery. The proximal lesion is roughly 85% in severity and has a irregular appearance. The second lesion is probably 60% in severity and has a smooth for non-ulcerated appearance. 5. The left ventricle is normal in size. The left ventricle systolic function is normal with an estimated ejection fraction of 60%. Regional wall motion abnormalities are not seen. No left ventricular thrombus is seen. There is no mitral insufficiency. The ascending aorta appears normal. There is no aortic valve stenosis by pullback. The left ventricular  end-diastolic pressure is 12  mm Hg.    IMPRESSIONS:   Acute coronary syndrome most likely related to a high-grade an ulcerated stenosis in the mid right coronary artery, without evidence of myocardial injury at this point.  RECOMMENDATION:  Urgent percutaneous revascularization of the right coronary artery. This was performed immediately phone the diagnostic procedure by Dr. Shawnie Pons M.D.   Thurmon Fair, MD, [facility] [phone] office [phone] pager

## 2012-07-11 NOTE — CV Procedure (Signed)
   CARDIAC CATH NOTE  Name: Christopher Weeks MRN: [id number] DOB: 07/19/1951  Procedure: PTCA and stenting of the RCA  Indication: unstable angina  Procedural Details: The diagnostic procedure was done by Dr. Salena Saner, and the sheath was upgraded to  a 6 Fr sheath.  Weight-based bivalirudin was given for anticoagulation and an oral load of prasugrel (60mg ) given. . Once a therapeutic ACT was achieved, a 6 [place] JR4 guide catheter was inserted.  A prowater coronary guidewire was used to cross the lesion.  The lesion was predilated with a 2.56mm balloon.  The lesion was then stented with a Promus Element 32 by 3.0 mm DES stent.  The stent was postdilated with a 3.25 and 3.5  noncompliant balloon.  Following PCI, there was 0% residual stenosis and TIMI-3 flow. Final angiography confirmed an excellent result. The patient tolerated the procedure well. There were no immediate procedural complications. A TR band was used for radial hemostasis. The patient was transferred to the post catheterization recovery area for further monitoring.  Lesion Data: Vessel: RCA Percent stenosis (pre): 90% and 70% TIMI-flow (pre):  3 Stent:  Promus Element 3.0 by 32 post dil to 3.4MM with [employer] Percent stenosis (post): 0% TIMI-flow (post): 3  The RCA is a large caliber artery.  There is mild irregularity before the RV branch, the just after an irregular 90% lesion.  Twenty MM distally there is a second 70% area of narrowing.  The PDA and PLA have mild luminal irregularities.   Following PCI crossing both lesions, there was no residual.    Conclusions:  1.  Successful PCI of the RCA for USAP 2.  Residual CFX and ramus disease in smaller caliber arteries.  Recommendations:  1.  DAPT for one year, with drug choice at the discretion of the primary cardiologist 2.  Medical therapy for residual disease.   2.     Shawnie Pons 07/11/2012, 1:05 PM

## 2012-07-11 NOTE — Progress Notes (Signed)
Pt c/o of 6/10 midsternal chest pain. Nonradiating. BP 171/113. On IV Heparin/NTG. Gave 1 SL NTG with relief. EKG obtained with no changes from previous. BP after SL NTG 149/77. Will continue to monitor. Earnest Conroy RN

## 2012-07-11 NOTE — Progress Notes (Signed)
ANTICOAGULATION CONSULT NOTE - Follow Up Consult  Pharmacy Consult for heparin Indication: chest pain/ACS  Labs:  Basename 07/11/12 0635 07/11/12 0232 07/11/12 0231 07/10/12 2225 07/10/12 2039 07/10/12 1650 07/10/12 1509 07/10/12 1100  HGB -- 14.7 -- -- -- -- 15.5 --  HCT -- 41.0 -- -- -- -- 43.3 41.6  PLT -- 194 -- -- -- -- 203 184  APTT -- -- -- -- -- -- -- --  LABPROT -- -- -- -- -- 12.8 -- --  INR -- -- -- -- -- 0.94 -- --  HEPARINUNFRC 0.10* -- -- <0.10* -- -- -- --  CREATININE -- 0.97 -- -- -- -- 0.96 1.01  CKTOTAL -- -- -- -- -- -- -- --  CKMB -- -- -- -- -- -- -- --  TROPONINI -- -- <0.30 -- <0.30 -- <0.30 --    Assessment: 61 yo male on heparin for chest pain, heparin level is subtherapeutic. No bleeding reported and no issues/interruptions with heparin drip per RN.    Goal of Therapy:  Heparin level 0.3-0.7 units/ml   Plan:  Will give another heparin bolus of 3000 units iv x1 and increase gtt to 1950 units/hr=19.36ml/hr. Heparin level 6 hours after rate change. Daily heparin level/cbc.   Wendie Simmer, PharmD, BCPS Clinical Pharmacist  Pager: [phone]

## 2012-07-11 NOTE — Progress Notes (Signed)
[facility]  Subjective: CP, 7/10,  last night resolved with SL nitro in about 3 mins.  Objective: Vital signs in last 24 hours: Temp:  [97.9 F (36.6 C)-98.2 F (36.8 C)] 98.2 F (36.8 C) (09/01 0500) Pulse Rate:  [46-65] 57  (09/01 0500) Resp:  [16-18] 18  (09/01 0500) BP: (122-171)/(77-113) 130/88 mmHg (09/01 0500) SpO2:  [95 %-100 %] 96 % (09/01 0500) Last BM Date: 07/11/12  Intake/Output from previous day: 08/31 0701 - 09/01 0700 In: -  Out: 400 [Urine:400] Intake/Output this shift:    Medications Current Facility-Administered Medications  Medication Dose Route Frequency Provider Last Rate Last Dose  . acetaminophen (TYLENOL) tablet 650 mg  650 mg Oral Q4H PRN Wilburt Finlay, PA   650 mg at 07/11/12 0215  . aspirin EC tablet 81 mg  81 mg Oral Daily Wilburt Finlay, [place]      . carbidopa-levodopa (SINEMET IR) 25-100 MG per tablet immediate release 1.5 tablet  1.5 tablet Oral TID Wilburt Finlay, PA   1.5 tablet at 07/10/12 2228  . diphenhydrAMINE (BENADRYL) capsule 25 mg  25 mg Oral Once Thurmon Fair, MD   25 mg at 07/11/12 0215  . diphenhydrAMINE (BENADRYL) capsule 50 mg  50 mg Oral Once Kerry Hough, PA   25 mg at 07/10/12 2351  . heparin ADULT infusion 100 units/mL (25000 units/250 mL)  1,950 Units/hr Intravenous Continuous Mihai Croitoru, MD 19.5 mL/hr at 07/11/12 0803 1,950 Units/hr at 07/11/12 0803  . heparin bolus via infusion 3,000 Units  3,000 Units Intravenous Once Colleen Can, PHARMD   3,000 Units at 07/11/12 0000  . heparin bolus via infusion 3,000 Units  3,000 Units Intravenous Once Thurmon Fair, MD   3,000 Units at 07/11/12 0800  . heparin bolus via infusion 4,000 Units  4,000 Units Intravenous Once Dannielle Karvonen [place], PHARMD   4,000 Units at 07/10/12 1700  . hydrochlorothiazide (HYDRODIURIL) tablet 25 mg  25 mg Oral Daily Wilburt Finlay, PA      . magnesium oxide (MAG-OX) tablet 400 mg  400 mg Oral BID Thurmon Fair, MD   400  mg at 07/10/12 2231  . metoprolol tartrate (LOPRESSOR) tablet 12.5 mg  12.5 mg Oral BID Kerry Hough, PA   12.5 mg at 07/10/12 1722  . nitroGLYCERIN 0.2 mg/mL in dextrose 5 % infusion  5 mcg/min Intravenous Titrated Wilburt Finlay, PA 1.5 mL/hr at 07/10/12 1719 5 mcg/min at 07/10/12 1719  . ondansetron (ZOFRAN) injection 4 mg  4 mg Intravenous Q6H PRN Wilburt Finlay, PA      . potassium chloride SA (K-DUR,KLOR-CON) CR tablet 40 mEq  40 mEq Oral Once Wilburt Finlay, PA   40 mEq at 07/10/12 1319  . simvastatin (ZOCOR) tablet 20 mg  20 mg Oral q1800 Wilburt Finlay, PA   20 mg at 07/10/12 1721  . DISCONTD: aspirin chewable tablet 243 mg  243 mg Oral Once Geoffery Lyons, MD      . DISCONTD: heparin injection 5,000 Units  5,000 Units Subcutaneous Q8H Wilburt Finlay, PA      . DISCONTD: magnesium oxide (MAG-OX) tablet 400 mg  400 mg Oral BID Wilburt Finlay, PA        PE: General appearance: alert and cooperative Lungs: clear to auscultation bilaterally Heart: regular rate and rhythm and 1/6 sys MM Extremities: No LEE Pulses: 2+ and symmetric Skin: warm and dry Neurologic: Grossly normal  Lab Results:   Basename 07/11/12 0232 07/10/12 1509 07/10/12 1100  WBC 7.2 8.3 8.2  HGB 14.7 15.5 15.2  HCT 41.0 43.3 41.6  PLT 194 203 184   BMET  Basename 07/11/12 0232 07/10/12 1509 07/10/12 1100  NA 138 -- 140  K 3.4* -- 3.4*  CL 101 -- 102  CO2 28 -- 29  GLUCOSE 110* -- 110*  BUN 18 -- 20  CREATININE 0.97 0.96 1.01  CALCIUM 9.8 -- 10.1   PT/INR  Basename 07/10/12 1650  LABPROT 12.8  INR 0.94    Lipid Panel     Component Value Date/Time   CHOL 235* 07/11/2012 0232   TRIG 203* 07/11/2012 0232   HDL 36* 07/11/2012 0232   CHOLHDL 6.5 07/11/2012 0232   VLDL 41* 07/11/2012 0232   LDLCALC 158* 07/11/2012 0232   Cardiac Panel (last 3 results)  Basename 07/11/12 0231 07/10/12 2039 07/10/12 1509  CKTOTAL -- -- --  CKMB -- -- --  TROPONINI <0.30 <0.30 <0.30  RELINDX -- -- --   Assessment/Plan  Principal  Problem:  *Unstable angina Active Problems:  HYPERLIPIDEMIA  Essential hypertension, benign  Parkinson's disease Bradycardia  Plan: CP last night.  Relieved with SL NTG.  Titrate IV nitro to 26mcg/min.  Replete K+.  Bradycardia into the 40's.  Parameters are on lopressor which is only 12.5.  Statin.  Left heart cath Tuesday.    LOS: 1 day    Christopher Weeks 07/11/2012 9:34 AM

## 2012-07-12 LAB — CBC
MCH: 32.3 pg (ref 26.0–34.0)
MCV: 91.3 fL (ref 78.0–100.0)
Platelets: 203 10*3/uL (ref 150–400)
RDW: 13.2 % (ref 11.5–15.5)

## 2012-07-12 LAB — BASIC METABOLIC PANEL
BUN: 14 mg/dL (ref 6–23)
Calcium: 9.6 mg/dL (ref 8.4–10.5)
Creatinine, Ser: 0.99 mg/dL (ref 0.50–1.35)
GFR calc Af Amer: >90 mL/min (ref >90)

## 2012-07-12 MED ORDER — FENOFIBRATE 54 MG PO TABS
54.0000 mg | ORAL_TABLET | Freq: Every day | ORAL | Status: DC
  Administered 2012-07-12 – 2012-07-13 (×2): 54 mg via ORAL
  Filled 2012-07-12 (×2): qty 1

## 2012-07-12 MED ORDER — ATORVASTATIN CALCIUM 80 MG PO TABS
80.0000 mg | ORAL_TABLET | Freq: Every day | ORAL | Status: DC
  Administered 2012-07-12: 80 mg via ORAL
  Filled 2012-07-12 (×2): qty 1

## 2012-07-12 MED ORDER — LISINOPRIL 20 MG PO TABS
20.0000 mg | ORAL_TABLET | Freq: Every day | ORAL | Status: DC
  Administered 2012-07-12 – 2012-07-13 (×2): 20 mg via ORAL
  Filled 2012-07-12 (×3): qty 1

## 2012-07-12 MED FILL — Dextrose Inj 5%: INTRAVENOUS | Qty: 1000 | Status: AC

## 2012-07-12 NOTE — Progress Notes (Signed)
At 1755 pt complained of feeling nauseated.  Assisted back to bed.  B/P 102/71.  Repeat B/P at 1800 111/55.  Denies chest pain, shortness of breath or dizziness.  Will continue to monitor.

## 2012-07-12 NOTE — Progress Notes (Signed)
[facility]  DAILY PROGRESS NOTE   Subjective:  Feels much better today. No further anginal symptoms.   Objective:  Temp:  [97.9 F (36.6 C)-98.7 F (37.1 C)] 98.4 F (36.9 C) (09/02 0333) Pulse Rate:  [45-81] 55  (09/02 0334) Resp:  [10-14] 14  (09/01 1700) BP: (110-146)/(67-83) 128/73 mmHg (09/02 0334) SpO2:  [97 %-99 %] 97 % (09/02 0334) Weight:  [95.9 kg (211 lb 6.7 oz)] 95.9 kg (211 lb 6.7 oz) (09/01 1301) Weight change:   Intake/Output from previous day: 09/01 0701 - 09/02 0700 In: 1470 [P.O.:720; I.V.:750] Out: 750 [Urine:750]  Intake/Output from this shift:    Medications: Current Facility-Administered Medications  Medication Dose Route Frequency Provider Last Rate Last Dose  . 0.9 %  sodium chloride infusion   Intravenous Continuous Herby Abraham, MD 125 mL/hr at 07/11/12 1351 125 mL/hr at 07/11/12 1351  . acetaminophen (TYLENOL) tablet 650 mg  650 mg Oral Q4H PRN Wilburt Finlay, PA   650 mg at 07/11/12 0215  . aspirin 81 MG chewable tablet        324 mg at 07/11/12 1026  . aspirin chewable tablet 81 mg  81 mg Oral Daily Herby Abraham, MD      . bivalirudin (ANGIOMAX) 250 MG injection           . carbidopa-levodopa (SINEMET IR) 25-100 MG per tablet immediate release 1.5 tablet  1.5 tablet Oral TID Wilburt Finlay, PA   1.5 tablet at 07/11/12 2305  . fentaNYL (SUBLIMAZE) 0.05 MG/ML injection           . heparin 1000 UNIT/ML injection           . heparin 2-0.9 UNIT/ML-% infusion           . heparin bolus via infusion 3,000 Units  3,000 Units Intravenous Once Thurmon Fair, MD   3,000 Units at 07/11/12 0800  . hydrochlorothiazide (HYDRODIURIL) tablet 25 mg  25 mg Oral Daily Wilburt Finlay, PA   25 mg at 07/11/12 1027  . lidocaine (XYLOCAINE) 1 % injection           . magnesium oxide (MAG-OX) tablet 400 mg  400 mg Oral BID Thurmon Fair, MD   400 mg at 07/11/12 2306  . metoprolol tartrate (LOPRESSOR) tablet 12.5 mg  12.5 mg Oral BID Kerry Hough, PA   12.5 mg at 07/11/12 2306  . midazolam (VERSED) 2 MG/2ML injection           . midazolam (VERSED) 2 MG/2ML injection           . morphine 2 MG/ML injection        2 mg at 07/11/12 1025  . nitroGLYCERIN (NTG ON-CALL) 0.2 mg/mL injection           . nitroGLYCERIN 0.2 mg/mL in dextrose 5 % infusion  10 mcg/min Intravenous Titrated Wilburt Finlay, PA   10 mcg/min at 07/11/12 1027  . ondansetron (ZOFRAN) injection 4 mg  4 mg Intravenous Q6H PRN Wilburt Finlay, PA      . potassium chloride SA (K-DUR,KLOR-CON) 20 MEQ CR tablet           . potassium chloride SA (K-DUR,KLOR-CON) CR tablet 40 mEq  40 mEq Oral Once Wilburt Finlay, PA   40 mEq at 07/11/12 1026  . prasugrel (EFFIENT) 10 MG tablet           . prasugrel (EFFIENT) tablet 10 mg  10 mg Oral Daily  Herby Abraham, MD      . simvastatin [facility]) tablet 20 mg  20 mg Oral q1800 Wilburt Finlay, PA   20 mg at 07/11/12 1820  . verapamil (ISOPTIN) 2.5 MG/ML injection           . DISCONTD: 0.9 %  sodium chloride infusion  250 mL Intravenous PRN Wilburt Finlay, PA      . DISCONTD: 0.9 %  sodium chloride infusion   Intravenous Continuous Wilburt Finlay, PA      . DISCONTD: aspirin EC tablet 81 mg  81 mg Oral Daily Wilburt Finlay, PA   81 mg at 07/11/12 1000  . DISCONTD: heparin ADULT infusion 100 units/mL (25000 units/250 mL)  1,950 Units/hr Intravenous Continuous Mihai Croitoru, MD 19.5 mL/hr at 07/11/12 0803 1,950 Units/hr at 07/11/12 0803  . DISCONTD: sodium chloride 0.9 % injection 3 mL  3 mL Intravenous Q12H Wilburt Finlay, PA      . DISCONTD: sodium chloride 0.9 % injection 3 mL  3 mL Intravenous PRN Wilburt Finlay, PA        Physical Exam: General appearance: alert and no distress Neck: no adenopathy, no carotid bruit, no JVD, supple, symmetrical, trachea midline and thyroid not enlarged, symmetric, no tenderness/mass/nodules Lungs: clear to auscultation bilaterally Heart: regular rate and rhythm, S1, S2 normal, no murmur, click, rub or gallop Abdomen: soft,  non-tender; bowel sounds normal; no masses,  no organomegaly Extremities: extremities normal, atraumatic, no cyanosis or edema Pulses: 2+ and symmetric  Lab Results: Results for orders placed during the hospital encounter of 07/10/12 (from the past 48 hour(s))  CBC WITH DIFFERENTIAL     Status: Abnormal   Collection Time   07/10/12 11:00 AM      Component Value Range Comment   WBC 8.2  4.0 - 10.5 K/uL    RBC 4.60  4.22 - 5.81 MIL/uL    Hemoglobin 15.2  13.0 - 17.0 g/dL    HCT 16.1  09.6 - 04.5 %    MCV 90.4  78.0 - 100.0 fL    MCH 33.0  26.0 - 34.0 pg    MCHC 36.5 (*) 30.0 - 36.0 g/dL    RDW 40.9  81.1 - 91.4 %    Platelets 184  150 - 400 K/uL    Neutrophils Relative 73  43 - 77 %    Neutro Abs 5.9  1.7 - 7.7 K/uL    Lymphocytes Relative 17  12 - 46 %    Lymphs Abs 1.4  0.7 - 4.0 K/uL    Monocytes Relative 9  3 - 12 %    Monocytes Absolute 0.7  0.1 - 1.0 K/uL    Eosinophils Relative 1  0 - 5 %    Eosinophils Absolute 0.1  0.0 - 0.7 K/uL    Basophils Relative 0  0 - 1 %    Basophils Absolute 0.0  0.0 - 0.1 K/uL   COMPREHENSIVE METABOLIC PANEL     Status: Abnormal   Collection Time   07/10/12 11:00 AM      Component Value Range Comment   Sodium 140  135 - 145 mEq/L    Potassium 3.4 (*) 3.5 - 5.1 mEq/L    Chloride 102  96 - 112 mEq/L    CO2 29  19 - 32 mEq/L    Glucose, Bld 110 (*) 70 - 99 mg/dL    BUN 20  6 - 23 mg/dL    Creatinine, Ser 7.82  0.50 - 1.35  mg/dL    Calcium 16.1  8.4 - 10.5 mg/dL    Total Protein 7.4  6.0 - 8.3 g/dL    Albumin 4.3  3.5 - 5.2 g/dL    AST 33  0 - 37 U/L    ALT 13  0 - 53 U/L    Alkaline Phosphatase 44  39 - 117 U/L    Total Bilirubin 0.4  0.3 - 1.2 mg/dL    GFR calc non Af Amer 79 (*) >90 mL/min    GFR calc Af Amer >90  >90 mL/min   TROPONIN I     Status: Normal   Collection Time   07/10/12 11:01 AM      Component Value Range Comment   Troponin I <0.30  <0.30 ng/mL   TSH     Status: Normal   Collection Time   07/10/12  3:09 PM       Component Value Range Comment   TSH 1.384  0.350 - 4.500 uIU/mL   HEMOGLOBIN A1C     Status: Abnormal   Collection Time   07/10/12  3:09 PM      Component Value Range Comment   Hemoglobin A1C 6.3 (*) <5.7 %    Mean Plasma Glucose 134 (*) <117 mg/dL   CBC     Status: Normal   Collection Time   07/10/12  3:09 PM      Component Value Range Comment   WBC 8.3  4.0 - 10.5 K/uL    RBC 4.77  4.22 - 5.81 MIL/uL    Hemoglobin 15.5  13.0 - 17.0 g/dL    HCT 09.6  04.5 - 40.9 %    MCV 90.8  78.0 - 100.0 fL    MCH 32.5  26.0 - 34.0 pg    MCHC 35.8  30.0 - 36.0 g/dL    RDW 81.1  91.4 - 78.2 %    Platelets 203  150 - 400 K/uL   CREATININE, SERUM     Status: Abnormal   Collection Time   07/10/12  3:09 PM      Component Value Range Comment   Creatinine, Ser 0.96  0.50 - 1.35 mg/dL    GFR calc non Af Amer 89 (*) >90 mL/min    GFR calc Af Amer >90  >90 mL/min   TROPONIN I     Status: Normal   Collection Time   07/10/12  3:09 PM      Component Value Range Comment   Troponin I <0.30  <0.30 ng/mL   PROTIME-INR     Status: Normal   Collection Time   07/10/12  4:50 PM      Component Value Range Comment   Prothrombin Time 12.8  11.6 - 15.2 seconds    INR 0.94  0.00 - 1.49   TROPONIN I     Status: Normal   Collection Time   07/10/12  8:39 PM      Component Value Range Comment   Troponin I <0.30  <0.30 ng/mL   HEPARIN LEVEL (UNFRACTIONATED)     Status: Abnormal   Collection Time   07/10/12 10:25 PM      Component Value Range Comment   Heparin Unfractionated <0.10 (*) 0.30 - 0.70 IU/mL   TROPONIN I     Status: Normal   Collection Time   07/11/12  2:31 AM      Component Value Range Comment   Troponin I <0.30  <0.30 ng/mL   BASIC METABOLIC PANEL  Status: Abnormal   Collection Time   07/11/12  2:32 AM      Component Value Range Comment   Sodium 138  135 - 145 mEq/L    Potassium 3.4 (*) 3.5 - 5.1 mEq/L    Chloride 101  96 - 112 mEq/L    CO2 28  19 - 32 mEq/L    Glucose, Bld 110 (*) 70 - 99 mg/dL     BUN 18  6 - 23 mg/dL    Creatinine, Ser 0.45  0.50 - 1.35 mg/dL    Calcium 9.8  8.4 - 40.9 mg/dL    GFR calc non Af Amer 89 (*) >90 mL/min    GFR calc Af Amer >90  >90 mL/min   CBC     Status: Normal   Collection Time   07/11/12  2:32 AM      Component Value Range Comment   WBC 7.2  4.0 - 10.5 K/uL    RBC 4.45  4.22 - 5.81 MIL/uL    Hemoglobin 14.7  13.0 - 17.0 g/dL    HCT 81.1  91.4 - 78.2 %    MCV 92.1  78.0 - 100.0 fL    MCH 33.0  26.0 - 34.0 pg    MCHC 35.9  30.0 - 36.0 g/dL    RDW 95.6  21.3 - 08.6 %    Platelets 194  150 - 400 K/uL   LIPID PANEL     Status: Abnormal   Collection Time   07/11/12  2:32 AM      Component Value Range Comment   Cholesterol 235 (*) 0 - 200 mg/dL    Triglycerides 578 (*) <150 mg/dL    HDL 36 (*) >46 mg/dL    Total CHOL/HDL Ratio 6.5      VLDL 41 (*) 0 - 40 mg/dL    LDL Cholesterol 962 (*) 0 - 99 mg/dL   HEPARIN LEVEL (UNFRACTIONATED)     Status: Abnormal   Collection Time   07/11/12  6:35 AM      Component Value Range Comment   Heparin Unfractionated 0.10 (*) 0.30 - 0.70 IU/mL   MRSA PCR SCREENING     Status: Normal   Collection Time   07/11/12  1:03 PM      Component Value Range Comment   MRSA by PCR NEGATIVE  NEGATIVE   CBC     Status: Normal   Collection Time   07/12/12  5:00 AM      Component Value Range Comment   WBC 8.9  4.0 - 10.5 K/uL    RBC 4.80  4.22 - 5.81 MIL/uL    Hemoglobin 15.5  13.0 - 17.0 g/dL    HCT 95.2  84.1 - 32.4 %    MCV 91.3  78.0 - 100.0 fL    MCH 32.3  26.0 - 34.0 pg    MCHC 35.4  30.0 - 36.0 g/dL    RDW 40.1  02.7 - 25.3 %    Platelets 203  150 - 400 K/uL     Imaging: Dg Chest Port 1 View  07/10/2012  *RADIOLOGY REPORT*  Clinical Data: Chest pain intermittently over the past 4 weeks. Numbness.  PORTABLE CHEST - 1 VIEW  Comparison: None.  Findings: Numerous leads and wires project over the chest.  Midline trachea.  Borderline cardiomegaly.     Mediastinal contours otherwise within normal limits.  Mild apical  lordotic patient positioning. No pleural effusion or pneumothorax.  Clear lungs.  IMPRESSION: Borderline cardiomegaly, without acute disease.   Original Report Authenticated By: Consuello Bossier, M.D.     Assessment:  1. Principal Problem: 2.  *Unstable angina 3. Active Problems: 4.  HYPERLIPIDEMIA 5.  Essential hypertension, benign 6.  Parkinson's disease 7.   Plan:  1. Chest pain is now gone. Radial cath site looks good. Still remains hypertensive. Add lisinopril 20 mg daily today. Change zocor to atorvastatin 80 mg daily (LDL 158) and add tricor 54 mg (TRIG 203). Replete K+. Ambulate with cardiac rehab today. Plan probable d/c home tomorrow. Ok to transfer to telemetry bed.  Time Spent Directly with Patient:  15 minutes  Length of Stay:  LOS: 2 days   Chrystie Nose, MD, [facility] Attending Cardiologist [facility]  Zalma Channing C 07/12/2012, 7:40 AM

## 2012-07-12 NOTE — Plan of Care (Signed)
Problem: Phase III Progression Outcomes Goal: Vascular site scale level 0 - I Vascular Site Scale Level 0: No bruising/bleeding/hematoma Level I (Mild): Bruising/Ecchymosis, minimal bleeding/ooozing, palpable hematoma < 3 cm Level II (Moderate): Bleeding not affecting hemodynamic parameters, pseudoaneurysm, palpable hematoma > 3 cm  Outcome: Completed/Met Date Met:  07/12/12 Right radial Level"0"

## 2012-07-13 ENCOUNTER — Telehealth: Payer: Self-pay | Admitting: Family Medicine

## 2012-07-13 DIAGNOSIS — I2510 Atherosclerotic heart disease of native coronary artery without angina pectoris: Secondary | ICD-10-CM

## 2012-07-13 HISTORY — DX: Atherosclerotic heart disease of native coronary artery without angina pectoris: I25.10

## 2012-07-13 LAB — POCT ACTIVATED CLOTTING TIME: Activated Clotting Time: 509 seconds

## 2012-07-13 MED ORDER — FENOFIBRATE 54 MG PO TABS: 54.0000 mg | ORAL_TABLET | Freq: Every day | ORAL | Status: DC

## 2012-07-13 MED ORDER — PRASUGREL HCL 10 MG PO TABS: 10.0000 mg | ORAL_TABLET | Freq: Every day | ORAL | Status: DC

## 2012-07-13 MED ORDER — ATORVASTATIN CALCIUM 80 MG PO TABS: 80.0000 mg | ORAL_TABLET | Freq: Every day | ORAL | Status: DC

## 2012-07-13 MED ORDER — LISINOPRIL 20 MG PO TABS: 20.0000 mg | ORAL_TABLET | Freq: Every day | ORAL | Status: DC

## 2012-07-13 MED ORDER — METOPROLOL TARTRATE 12.5 MG HALF TABLET: 12.5000 mg | ORAL_TABLET | Freq: Two times a day (BID) | ORAL | Status: DC

## 2012-07-13 NOTE — Telephone Encounter (Signed)
Will forward to Dr Hensel 

## 2012-07-13 NOTE — Care Management Note (Signed)
    Page 1 of 1   07/13/2012     11:34:47 AM   CARE MANAGEMENT NOTE 07/13/2012  Patient:  Christopher Weeks, Christopher Weeks   Account Number:  [id number]  Date Initiated:  07/13/2012  Documentation initiated by:  Junius Creamer  Subjective/Objective Assessment:   adm w angina     Action/Plan:   lives w wife, pcp dr Nelva Nay hensel   Anticipated DC Date:     Anticipated DC Plan:        DC Planning Services  CM consult      Choice offered to / List presented to:             Status of service:   Medicare Important Message given?   (If response is "NO", the following Medicare IM given date fields will be blank) Date Medicare IM given:   Date Additional Medicare IM given:    Discharge Disposition:    Per UR Regulation:  Reviewed for med. necessity/level of care/duration of stay  If discussed at Long Length of Stay Meetings, dates discussed:    Comments:  9/3 11:24a debbie Sacred Roa rn,bns [phone] will give pt effient copay assist card and 30day free card.

## 2012-07-13 NOTE — Telephone Encounter (Signed)
Called and discussed

## 2012-07-13 NOTE — Telephone Encounter (Signed)
Patient has been admitted and is in room 2928.  His wife called to cancel his appt for Friday due to the hospitalization and is asking if Dr. Leveda Anna can come by to see him.

## 2012-07-13 NOTE — Progress Notes (Signed)
[facility]  Subjective: No further Cp.  Objective: Vital signs in last 24 hours: Temp:  [97.7 F (36.5 C)-98.3 F (36.8 C)] 97.7 F (36.5 C) (09/03 0400) Pulse Rate:  [52-58] 52  (09/02 1630) Resp:  [14-20] 16  (09/02 2000) BP: (102-127)/(55-78) 117/72 mmHg (09/03 0503) SpO2:  [95 %-98 %] 95 % (09/03 0400) Last BM Date: 07/12/12  Intake/Output from previous day: 09/02 0701 - 09/03 0700 In: 1320 [P.O.:1320] Out: 2100 [Urine:2100] Intake/Output this shift:    Medications Current Facility-Administered Medications  Medication Dose Route Frequency Provider Last Rate Last Dose  . acetaminophen (TYLENOL) tablet 650 mg  650 mg Oral Q4H PRN Wilburt Finlay, PA   650 mg at 07/12/12 1703  . aspirin chewable tablet 81 mg  81 mg Oral Daily Herby Abraham, MD   81 mg at 07/12/12 1028  . atorvastatin (LIPITOR) tablet 80 mg  80 mg Oral q1800 Chrystie Nose, MD   80 mg at 07/12/12 1703  . carbidopa-levodopa (SINEMET IR) 25-100 MG per tablet immediate release 1.5 tablet  1.5 tablet Oral TID Wilburt Finlay, PA   1.5 tablet at 07/12/12 2136  . fenofibrate tablet 54 mg  54 mg Oral Daily Chrystie Nose, MD   54 mg at 07/12/12 1028  . hydrochlorothiazide (HYDRODIURIL) tablet 25 mg  25 mg Oral Daily Wilburt Finlay, PA   25 mg at 07/12/12 1027  . lisinopril (PRINIVIL,ZESTRIL) tablet 20 mg  20 mg Oral Daily Chrystie Nose, MD   20 mg at 07/12/12 1028  . magnesium oxide (MAG-OX) tablet 400 mg  400 mg Oral BID Thurmon Fair, MD   400 mg at 07/12/12 2136  . metoprolol tartrate (LOPRESSOR) tablet 12.5 mg  12.5 mg Oral BID Kerry Hough, PA   12.5 mg at 07/12/12 2138  . nitroGLYCERIN 0.2 mg/mL in dextrose 5 % infusion  10 mcg/min Intravenous Titrated Wilburt Finlay, PA   10 mcg/min at 07/11/12 1027  . ondansetron (ZOFRAN) injection 4 mg  4 mg Intravenous Q6H PRN Wilburt Finlay, PA      . prasugrel (EFFIENT) tablet 10 mg  10 mg Oral Daily Herby Abraham, MD   10 mg at 07/12/12  1029    PE: General appearance: alert, cooperative and no distress Lungs: clear to auscultation bilaterally Heart: regular rate and rhythm, S1, S2 normal, no murmur, click, rub or gallop Extremities: No LEE Pulses: 2+ and symmetric Skin: Warm and dry.  Right wrist: no hematoma of ecchymosis. Neurologic: Grossly normal  Lab Results:   Basename 07/12/12 0500 07/11/12 0232 07/10/12 1509  WBC 8.9 7.2 8.3  HGB 15.5 14.7 15.5  HCT 43.8 41.0 43.3  PLT 203 194 203   BMET  Basename 07/12/12 0500 07/11/12 0232 07/10/12 1509 07/10/12 1100  NA 137 138 -- 140  K 3.8 3.4* -- 3.4*  CL 101 101 -- 102  CO2 23 28 -- 29  GLUCOSE 117* 110* -- 110*  BUN 14 18 -- 20  CREATININE 0.99 0.97 0.96 --  CALCIUM 9.6 9.8 -- 10.1   Cardiac Panel (last 3 results)  Basename 07/11/12 0231 07/10/12 2039 07/10/12 1509  CKTOTAL -- -- --  CKMB -- -- --  TROPONINI <0.30 <0.30 <0.30  RELINDX -- -- --   PT/INR  Basename 07/10/12 1650  LABPROT 12.8  INR 0.94   Cholesterol  Basename 07/11/12 0232  CHOL 235*   Lipid Panel     Component Value Date/Time   CHOL  235* 07/11/2012 0232   TRIG 203* 07/11/2012 0232   HDL 36* 07/11/2012 0232   CHOLHDL 6.5 07/11/2012 0232   VLDL 41* 07/11/2012 0232   LDLCALC 158* 07/11/2012 0232    Assessment/Plan   Principal Problem:  *Unstable angina Active Problems:  HYPERLIPIDEMIA  Essential hypertension, benign  Parkinson's disease  Plan:  S/P Radial cath.  Promus stent to RCA.  60-70% long prox OM lesion still.  80% small diag.   EF60%.  Doing well.  Would benefit from Cardiac rehab this AM.  ASA/ lipitor/ fenofibrate/ lisinopril/lopressor/effient.  DC home today with Post cath initial follow up with Extender.    LOS: 3 days    HAGER, BRYAN 07/13/2012 8:36 AM  Patient seen and examined. Agree with assessment and plan. No recurrent CP, s/p PCI to RCA on 07/11/12. Ambulating well. Lipip panel c/w atherogenic dyslipidemia, now on combination therapy.  For DC today; f/u  Dr. Zandra Abts, MD, [facility] 07/13/2012 10:18 AM

## 2012-07-13 NOTE — Progress Notes (Signed)
CARDIAC REHAB PHASE I   PRE:  Rate/Rhythm: 70 SR    BP: sitting 137/67    SaO2:   MODE:  Ambulation: 700 ft   POST:  Rate/Rhythm: 85 SR    BP: sitting 141/84     SaO2:   Tolerated very well. Feels good. Ed completed, pt motivated to change. Requests his name be sent to [facility]. [phone]  Harriet Masson CES, ACSM

## 2012-07-16 ENCOUNTER — Ambulatory Visit: Payer: Managed Care, Other (non HMO) | Admitting: Family Medicine

## 2012-07-25 NOTE — Discharge Summary (Signed)
Physician Discharge Summary  Patient ID: Christopher Weeks MRN: [id number] DOB/AGE: Mar 17, 1951 61 y.o.  Admit date: 07/10/2012 Discharge date: 07/25/2012  Admission Diagnoses:    Discharge Diagnoses:  Principal Problem:  *Unstable angina Active Problems:  HYPERLIPIDEMIA  Essential hypertension, benign  Parkinson's disease   Discharged Condition: stable  Hospital Course:   Patient is a 61 year old Caucasian male with history of hypertension kidney stones, Parkinson's disease, hyperlipidemia and remote tobacco history. He stated he quit smoking approximately 15 years ago and prior had a 15 pack year history. Patient presents with progressive chest pain. He described his pain as "pressing". He stated that over the last month and a half he's been having exertional angina at least one time per day this is progressed to 2 times per day. Has woken him up from sleep this past Thursday of 4:30 hours. This morning he had 4 different episodes which lasted 2-3 minutes with minimal exertion. He notes associated nausea, dizziness, diaphoresis and left arm left and right arm numbness. According to his wife they made some dietary changes in the last 6 months he has lost approximately 25 pounds. He denied vomiting, fever, shortness of breath, palpitations, orthopnea, PND, abdominal pain, lower extremity edema, dysuria, hematuria, hematochezia, melena, cough, congestion, sore throat.  The patient continued to have chest pain.  Cardiac markers were negative and he had no acute EKG changes.  He was taken to the cath lab earlier than originally planned due to continued pain.  This revealed high-grade mid RCA stenosis.  He had resolution of symptoms.  EF is 60%.  Meds: ASA/ lipitor/ fenofibrate/ lisinopril/lopressor/effient. He was discharge home in stable condition after being seen by Dr. Tresa Endo.  Consults: None  Significant Diagnostic Studies:  CARDIAC CATHETERIZATION REPORT  Procedures performed:  1. Left heart  catheterization  2. Selective coronary angiography  3. Left ventriculography  Reason for procedure:  Unstable angina pectoris  Procedure performed by: Thurmon Fair, MD, [facility]  Complications: none  Estimated blood loss: less than 5 mL  History: 61 year old man without previous cardiac or other serious medical problems who presents with symptoms of classic exertional angina that then progressed in an accelerated fashion to unstable angina including angina at rest. The morning of the procedure the patient had recurrent episodes of chest pain at rest or on a temporarily relieved with sublingual nitroglycerin and intravenous nitroglycerin. The procedures performed in an urgent fashion.  Consent: The risks, benefits, and details of the procedure were explained to the patient. Risks including death, MI, stroke, bleeding, limb ischemia, renal failure and allergy were described and accepted by the patient. Informed written consent was obtained prior to proceeding.  Technique: The patient was brought to the cardiac catheterization laboratory in the fasting state. He was prepped and draped in the usual sterile fashion. Local anesthesia with 1% lidocaine was administered to the right wrist area. Using the modified Seldinger technique a 5 French right radial artery sheath was introduced without difficulty. Under fluoroscopic guidance, using 5 French TIG and angled pigtail catheters, selective cannulation of the left coronary artery, right coronary artery and left ventricle were respectively performed. Several coronary angiograms in a variety of projections were recorded, as well as a left ventriculogram in the RAO projection. Left ventricular pressure and a pull back to the aorta were recorded. No immediate complications occurred. At the end of the procedure, all catheters were removed. After the procedure, hemostasis will be achieved with manual pressure.  Contrast used: 90 mL Omnipaque  Angiographic Findings:  1.  The left main coronary artery is free of significant atherosclerosis and bifurcates in the usual fashion into the left anterior descending artery and left circumflex coronary artery.  2. The left anterior descending artery is a large vessel that reaches the apex and generates two major diagonal branches. The first diagonal artery smaller but has a high-grade probably 80% right-sided stenosis in its proximal third. The vessel appears to be no more than 1.5 mm in diameter. There is evidence of mild luminal irregularities and no calcification in the remainder of the LAD system. No other hemodynamically meaningful stenoses are seen.  3. The left circumflex coronary artery is a relatively small-size vessel non- dominant vessel that generates 1 major oblique marginal artery. There is evidence of extensive luminal irregularities and minimal calcification. There is a long proximal 60-70% lesion that appears smooth but no other hemodynamically meaningful stenoses are seen.  4. The right coronary artery is a very large-size dominant vessel that generates a medium-size posterior lateral ventricular system as well as a very long and large caliber posterior descending artery. There is evidence of extensive luminal irregularities and minute calcification. 2 sequential stenoses are seen in the mid right coronary artery. The proximal lesion is roughly 85% in severity and has a irregular appearance. The second lesion is probably 60% in severity and has a smooth for non-ulcerated appearance.  5. The left ventricle is normal in size. The left ventricle systolic function is normal with an estimated ejection fraction of 60%. Regional wall motion abnormalities are not seen. No left ventricular thrombus is seen. There is no mitral insufficiency. The ascending aorta appears normal. There is no aortic valve stenosis by pullback. The left ventricular end-diastolic pressure is 12 mm Hg.   IMPRESSIONS:  Acute coronary syndrome most  likely related to a high-grade an ulcerated stenosis in the mid right coronary artery, without evidence of myocardial injury at this point.  RECOMMENDATION:  Urgent percutaneous revascularization of the right coronary artery.  This was performed immediately phone the diagnostic procedure by Dr. Shawnie Pons M.D.  Thurmon Fair, MD, [facility]   CARDIAC CATH NOTE  Name: Christopher Weeks  MRN: [id number]  DOB: 1951/09/08  Procedure: PTCA and stenting of the RCA  Indication: unstable angina  Procedural Details: The diagnostic procedure was done by Dr. Salena Saner, and the sheath was upgraded to a 6 Fr sheath. Weight-based bivalirudin was given for anticoagulation and an oral load of prasugrel (60mg ) given. . Once a therapeutic ACT was achieved, a 6 [place] JR4 guide catheter was inserted. A prowater coronary guidewire was used to cross the lesion. The lesion was predilated with a 2.53mm balloon. The lesion was then stented with a Promus Element 32 by 3.0 mm DES stent. The stent was postdilated with a 3.25 and 3.5 noncompliant balloon. Following PCI, there was 0% residual stenosis and TIMI-3 flow. Final angiography confirmed an excellent result. The patient tolerated the procedure well. There were no immediate procedural complications. A TR band was used for radial hemostasis. The patient was transferred to the post catheterization recovery area for further monitoring.  Lesion Data:  Vessel: RCA  Percent stenosis (pre): 90% and 70%  TIMI-flow (pre): 3  Stent: Promus Element 3.0 by 32 post dil to 3.4MM with [place]  Percent stenosis (post): 0%  TIMI-flow (post): 3  The RCA is a large caliber artery. There is mild irregularity before the RV branch, the just after an irregular 90% lesion. Twenty MM distally  there is a second 70% area of narrowing. The PDA and PLA have mild luminal irregularities. Following PCI crossing both lesions, there was no residual.  Conclusions:  1. Successful  PCI of the RCA for USAP  2. Residual CFX and ramus disease in smaller caliber arteries.  Recommendations:  1. DAPT for one year, with drug choice at the discretion of the primary cardiologist  2. Medical therapy for residual disease.  2.  Shawnie Pons  Treatments: Stent to RCA  Discharge Exam: Blood pressure 112/70, pulse 70, temperature 98 F (36.7 C), temperature source Oral, resp. rate 16, height 6' (1.829 m), weight 95.9 kg (211 lb 6.7 oz), SpO2 98.00%.   Disposition: 01-Home or Self Care  Discharge Orders    Future Orders Please Complete By Expires   Amb Referral to Cardiac Rehabilitation      Diet - low sodium heart healthy      Increase activity slowly      Discharge instructions      Comments:   No lifting or driving for the next two days.  Stay out of work until seen in the office.       Medication List     As of 07/25/2012 10:01 AM    STOP taking these medications         pravastatin 40 MG tablet   Commonly known as: PRAVACHOL      TAKE these medications         aspirin 81 MG tablet   Take 81 mg by mouth daily.      atorvastatin 80 MG tablet   Commonly known as: LIPITOR   Take 1 tablet (80 mg total) by mouth daily at 6 PM.      Calcium-Vitamin D 600-125 MG-UNIT Tabs   1 by mouth daily      carbidopa-levodopa 25-100 MG per tablet   Commonly known as: SINEMET IR   Take 1.5 tablets by mouth 3 (three) times daily.      fenofibrate 54 MG tablet   Take 1 tablet (54 mg total) by mouth daily.      hydrochlorothiazide 25 MG tablet   Commonly known as: HYDRODIURIL   Take 25 mg by mouth daily.      lisinopril 20 MG tablet   Commonly known as: PRINIVIL,ZESTRIL   Take 1 tablet (20 mg total) by mouth daily.      magnesium oxide 400 MG tablet   Commonly known as: MAG-OX   Take 400 mg by mouth 2 (two) times daily.      metoprolol tartrate 12.5 mg Tabs   Commonly known as: LOPRESSOR   Take 0.5 tablets (12.5 mg total) by mouth 2 (two) times daily.       multivitamin tablet   Take 1 tablet by mouth daily.      Omega-3 350 MG Caps   2 by mouth daily      prasugrel 10 MG Tabs   Commonly known as: EFFIENT   Take 1 tablet (10 mg total) by mouth daily.      vitamin C 500 MG tablet   Commonly known as: ASCORBIC ACID   Take 500 mg by mouth daily.           Follow-up Information    Follow up with Abelino Derrick, PA. (Our office will call you tomorrow with the appt date and time.  It should be next week.)    Contact information:   [address] [place] [address]  [phone]          Signed: Wilburt Finlay 07/25/2012, 10:01 AM

## 2012-08-03 ENCOUNTER — Other Ambulatory Visit: Payer: Self-pay | Admitting: Family Medicine

## 2012-08-06 ENCOUNTER — Encounter: Payer: Self-pay | Admitting: Family Medicine

## 2012-08-06 ENCOUNTER — Ambulatory Visit (INDEPENDENT_AMBULATORY_CARE_PROVIDER_SITE_OTHER): Payer: Managed Care, Other (non HMO) | Admitting: Family Medicine

## 2012-08-06 VITALS — BP 124/74 | HR 52 | Temp 98.0°F | Ht 72.0 in | Wt 214.0 lb

## 2012-08-06 DIAGNOSIS — R7303 Prediabetes: Secondary | ICD-10-CM

## 2012-08-06 DIAGNOSIS — R7309 Other abnormal glucose: Secondary | ICD-10-CM

## 2012-08-06 HISTORY — DX: Prediabetes: R73.03

## 2012-08-06 NOTE — Patient Instructions (Signed)
The most important number for following diabetes or pre diabetes is the Hemoglobin A1C.  Non diabetics have a A1C < 5.8 We don't call someone diabetic until the A1C is over 6.5  When we are treating diabetes we aim for an A1c under 7.0 or sometimes under 6.5 Your A1C on 8/31 was 6.3

## 2012-08-06 NOTE — Progress Notes (Signed)
  Subjective:    Patient ID: Christopher Weeks, male    DOB: 1950-12-06, 61 y.o.   MRN: [id number]  HPI  Doing OK since cardiac stents.  Enters cardiac rehab next week. Easily fatigued.  No chest pain.  Does get short of breath on exertion.  Following diet and having slow wt loss. Did have pre diabetes based on hospital labs Working on diet.   Parkinson's meds helping tremor some, but not completely and having side effects of irritability. Plans to apply for disability.  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     Review of Systems     Objective:   Physical ExamLungs clear Cardiac RRR without m or g Abd benign Ext no edema Neuro, still pill rolling tremor at rest.  Some cogwheel rigidity.        Assessment & Plan:

## 2012-08-26 ENCOUNTER — Encounter (HOSPITAL_COMMUNITY)
Admission: RE | Admit: 2012-08-26 | Discharge: 2012-08-26 | Disposition: A | Discharge status: Home / Self Care | Payer: Managed Care, Other (non HMO) | Source: Ambulatory Visit | Attending: Cardiovascular Disease | Admitting: Cardiovascular Disease

## 2012-08-26 ENCOUNTER — Encounter (HOSPITAL_COMMUNITY): Payer: Self-pay

## 2012-08-26 VITALS — BP 110/80 | HR 47 | Ht 72.0 in | Wt 210.3 lb

## 2012-08-26 DIAGNOSIS — Z9861 Coronary angioplasty status: Secondary | ICD-10-CM

## 2012-08-26 DIAGNOSIS — Z5189 Encounter for other specified aftercare: Secondary | ICD-10-CM | POA: Insufficient documentation

## 2012-08-26 DIAGNOSIS — I219 Acute myocardial infarction, unspecified: Secondary | ICD-10-CM

## 2012-08-26 DIAGNOSIS — I252 Old myocardial infarction: Secondary | ICD-10-CM | POA: Insufficient documentation

## 2012-08-26 NOTE — Patient Instructions (Addendum)
Pt has finished orientation and is scheduled to start CR on 08/30/12 at 9:30. Pt has been instructed to arrive to class 15 minutes early for scheduled class. Pt has been instructed to wear comfortable clothing and shoes with rubber soles. Pt has been told to take their medications 1 hour prior to coming to class.  If the patient is not going to attend class, he/she has been instructed to call.   

## 2012-08-26 NOTE — Progress Notes (Signed)
Patient has bee referred to Cardiac Rehab by Dr. Royann Shivers due to Stent placement V45.82 and Myocardial Infarction 410.90. During orientation advised patient on arrival and appointment times what to wear, what to do before, during and after exercise. Reviewed attendance and class policy. Talked about inclement weather and class consultation policy. Pt is scheduled to start Cardiac Rehab on 08/30/12 at 9:30. Pt was advised to come to class 5 minutes before class starts. He was also given instructions on meeting with the dietician and attending the Family Structure classes. Pt is eager to get started.

## 2012-08-30 ENCOUNTER — Encounter (HOSPITAL_COMMUNITY)
Admission: RE | Admit: 2012-08-30 | Discharge: 2012-08-30 | Disposition: A | Discharge status: Home / Self Care | Payer: Managed Care, Other (non HMO) | Source: Ambulatory Visit | Attending: Cardiovascular Disease | Admitting: Cardiovascular Disease

## 2012-09-01 ENCOUNTER — Encounter (HOSPITAL_COMMUNITY)
Admission: RE | Admit: 2012-09-01 | Discharge: 2012-09-01 | Disposition: A | Discharge status: Home / Self Care | Payer: Managed Care, Other (non HMO) | Source: Ambulatory Visit | Attending: Cardiovascular Disease | Admitting: Cardiovascular Disease

## 2012-09-03 ENCOUNTER — Encounter (HOSPITAL_COMMUNITY)
Admission: RE | Admit: 2012-09-03 | Discharge: 2012-09-03 | Disposition: A | Discharge status: Home / Self Care | Payer: Managed Care, Other (non HMO) | Source: Ambulatory Visit | Attending: Cardiovascular Disease | Admitting: Cardiovascular Disease

## 2012-09-06 ENCOUNTER — Encounter (HOSPITAL_COMMUNITY)
Admission: RE | Admit: 2012-09-06 | Discharge: 2012-09-06 | Disposition: A | Discharge status: Home / Self Care | Payer: Managed Care, Other (non HMO) | Source: Ambulatory Visit | Attending: Cardiovascular Disease | Admitting: Cardiovascular Disease

## 2012-09-08 ENCOUNTER — Encounter (HOSPITAL_COMMUNITY)
Admission: RE | Admit: 2012-09-08 | Discharge: 2012-09-08 | Disposition: A | Discharge status: Home / Self Care | Payer: Managed Care, Other (non HMO) | Source: Ambulatory Visit | Attending: Cardiovascular Disease | Admitting: Cardiovascular Disease

## 2012-09-10 ENCOUNTER — Encounter (HOSPITAL_COMMUNITY)
Admission: RE | Admit: 2012-09-10 | Discharge: 2012-09-10 | Disposition: A | Discharge status: Home / Self Care | Payer: Managed Care, Other (non HMO) | Source: Ambulatory Visit | Attending: Cardiovascular Disease | Admitting: Cardiovascular Disease

## 2012-09-10 DIAGNOSIS — Z9861 Coronary angioplasty status: Secondary | ICD-10-CM | POA: Insufficient documentation

## 2012-09-10 DIAGNOSIS — Z5189 Encounter for other specified aftercare: Secondary | ICD-10-CM | POA: Insufficient documentation

## 2012-09-10 DIAGNOSIS — I252 Old myocardial infarction: Secondary | ICD-10-CM | POA: Insufficient documentation

## 2012-09-13 ENCOUNTER — Encounter (HOSPITAL_COMMUNITY)
Admission: RE | Admit: 2012-09-13 | Discharge: 2012-09-13 | Disposition: A | Discharge status: Home / Self Care | Payer: Managed Care, Other (non HMO) | Source: Ambulatory Visit | Attending: Cardiovascular Disease | Admitting: Cardiovascular Disease

## 2012-09-15 ENCOUNTER — Encounter (HOSPITAL_COMMUNITY)
Admission: RE | Admit: 2012-09-15 | Discharge: 2012-09-15 | Disposition: A | Discharge status: Home / Self Care | Payer: Managed Care, Other (non HMO) | Source: Ambulatory Visit | Attending: Cardiovascular Disease | Admitting: Cardiovascular Disease

## 2012-09-17 ENCOUNTER — Encounter (HOSPITAL_COMMUNITY)
Admission: RE | Admit: 2012-09-17 | Discharge: 2012-09-17 | Disposition: A | Discharge status: Home / Self Care | Payer: Managed Care, Other (non HMO) | Source: Ambulatory Visit | Attending: Cardiovascular Disease | Admitting: Cardiovascular Disease

## 2012-09-20 ENCOUNTER — Encounter (HOSPITAL_COMMUNITY)
Admission: RE | Admit: 2012-09-20 | Discharge: 2012-09-20 | Disposition: A | Discharge status: Home / Self Care | Payer: Managed Care, Other (non HMO) | Source: Ambulatory Visit | Attending: Cardiovascular Disease | Admitting: Cardiovascular Disease

## 2012-09-22 ENCOUNTER — Encounter (HOSPITAL_COMMUNITY)
Admission: RE | Admit: 2012-09-22 | Discharge: 2012-09-22 | Disposition: A | Discharge status: Home / Self Care | Payer: Managed Care, Other (non HMO) | Source: Ambulatory Visit | Attending: Cardiovascular Disease | Admitting: Cardiovascular Disease

## 2012-09-24 ENCOUNTER — Encounter (HOSPITAL_COMMUNITY)
Admission: RE | Admit: 2012-09-24 | Discharge: 2012-09-24 | Disposition: A | Discharge status: Home / Self Care | Payer: Managed Care, Other (non HMO) | Source: Ambulatory Visit | Attending: Cardiovascular Disease | Admitting: Cardiovascular Disease

## 2012-09-27 ENCOUNTER — Encounter (HOSPITAL_COMMUNITY)
Admission: RE | Admit: 2012-09-27 | Discharge: 2012-09-27 | Disposition: A | Discharge status: Home / Self Care | Payer: Managed Care, Other (non HMO) | Source: Ambulatory Visit | Attending: Cardiovascular Disease | Admitting: Cardiovascular Disease

## 2012-09-28 ENCOUNTER — Telehealth: Payer: Self-pay | Admitting: Family Medicine

## 2012-09-28 NOTE — Telephone Encounter (Signed)
Disability claim paperwork to be completed by [place].

## 2012-09-29 ENCOUNTER — Encounter (HOSPITAL_COMMUNITY)
Admission: RE | Admit: 2012-09-29 | Discharge: 2012-09-29 | Disposition: A | Discharge status: Home / Self Care | Payer: Managed Care, Other (non HMO) | Source: Ambulatory Visit | Attending: Cardiovascular Disease | Admitting: Cardiovascular Disease

## 2012-09-29 NOTE — Telephone Encounter (Signed)
Form completed and given to Donna Loring. 

## 2012-09-30 NOTE — Telephone Encounter (Signed)
[facility] notified Disability forms are completed and ready to be picked up at the front desk.  Ileana Ladd

## 2012-10-01 ENCOUNTER — Encounter (HOSPITAL_COMMUNITY): Payer: Managed Care, Other (non HMO)

## 2012-10-04 ENCOUNTER — Encounter (HOSPITAL_COMMUNITY)
Admission: RE | Admit: 2012-10-04 | Discharge: 2012-10-04 | Disposition: A | Discharge status: Home / Self Care | Payer: Managed Care, Other (non HMO) | Source: Ambulatory Visit | Attending: Cardiovascular Disease | Admitting: Cardiovascular Disease

## 2012-10-06 ENCOUNTER — Encounter (HOSPITAL_COMMUNITY)
Admission: RE | Admit: 2012-10-06 | Discharge: 2012-10-06 | Disposition: A | Discharge status: Home / Self Care | Payer: Managed Care, Other (non HMO) | Source: Ambulatory Visit | Attending: Cardiovascular Disease | Admitting: Cardiovascular Disease

## 2012-10-08 ENCOUNTER — Encounter (HOSPITAL_COMMUNITY): Payer: Managed Care, Other (non HMO)

## 2012-10-11 ENCOUNTER — Encounter (HOSPITAL_COMMUNITY)
Admission: RE | Admit: 2012-10-11 | Discharge: 2012-10-11 | Disposition: A | Discharge status: Home / Self Care | Payer: Managed Care, Other (non HMO) | Source: Ambulatory Visit | Attending: Cardiovascular Disease | Admitting: Cardiovascular Disease

## 2012-10-11 ENCOUNTER — Telehealth: Payer: Self-pay | Admitting: Family Medicine

## 2012-10-11 DIAGNOSIS — Z9861 Coronary angioplasty status: Secondary | ICD-10-CM | POA: Insufficient documentation

## 2012-10-11 DIAGNOSIS — I252 Old myocardial infarction: Secondary | ICD-10-CM | POA: Insufficient documentation

## 2012-10-11 DIAGNOSIS — Z5189 Encounter for other specified aftercare: Secondary | ICD-10-CM | POA: Insufficient documentation

## 2012-10-11 NOTE — Telephone Encounter (Signed)
Called and LMOVM for  Christopher Weeks to call back so that I can verbally informed them that Christopher Weeks can return to work on 01/07/2013 per Dr. Leveda Anna.Laureen Ochs, Viann Shove

## 2012-10-11 NOTE — Telephone Encounter (Signed)
Wife is calling because the workplace did not like that the return to work date was scribbled through on the 2nd page.  The workplace needs a call from Dr. Leveda Anna to Sabas Sous at [facility] [employer], at [phone], to verbally let them know the expected date for him to return to work.

## 2012-10-13 ENCOUNTER — Encounter (HOSPITAL_COMMUNITY)
Admission: RE | Admit: 2012-10-13 | Discharge: 2012-10-13 | Disposition: A | Discharge status: Home / Self Care | Payer: Managed Care, Other (non HMO) | Source: Ambulatory Visit | Attending: Cardiovascular Disease | Admitting: Cardiovascular Disease

## 2012-10-13 NOTE — Telephone Encounter (Signed)
Called Ms. Mayford Knife to clarify when the pt is to return to work and told her that either he can return on 2/28 or in 5-6 months which is when he will be released from Cardiac Rehab. She stated that she is going to use what is already listed on his paperwork which is the 5-6 months.Loralee Pacas [place]

## 2012-10-15 ENCOUNTER — Encounter (HOSPITAL_COMMUNITY)
Admission: RE | Admit: 2012-10-15 | Discharge: 2012-10-15 | Disposition: A | Discharge status: Home / Self Care | Payer: Managed Care, Other (non HMO) | Source: Ambulatory Visit | Attending: Cardiovascular Disease | Admitting: Cardiovascular Disease

## 2012-10-18 ENCOUNTER — Encounter (HOSPITAL_COMMUNITY)
Admission: RE | Admit: 2012-10-18 | Discharge: 2012-10-18 | Disposition: A | Discharge status: Home / Self Care | Payer: Managed Care, Other (non HMO) | Source: Ambulatory Visit | Attending: Cardiovascular Disease | Admitting: Cardiovascular Disease

## 2012-10-20 ENCOUNTER — Encounter (HOSPITAL_COMMUNITY)
Admission: RE | Admit: 2012-10-20 | Discharge: 2012-10-20 | Disposition: A | Discharge status: Home / Self Care | Payer: Managed Care, Other (non HMO) | Source: Ambulatory Visit | Attending: Cardiovascular Disease | Admitting: Cardiovascular Disease

## 2012-10-22 ENCOUNTER — Encounter (HOSPITAL_COMMUNITY)
Admission: RE | Admit: 2012-10-22 | Discharge: 2012-10-22 | Disposition: A | Discharge status: Home / Self Care | Payer: Managed Care, Other (non HMO) | Source: Ambulatory Visit | Attending: Cardiovascular Disease | Admitting: Cardiovascular Disease

## 2012-10-25 ENCOUNTER — Encounter (HOSPITAL_COMMUNITY): Admission: RE | Admit: 2012-10-25 | Payer: Managed Care, Other (non HMO) | Source: Ambulatory Visit

## 2012-10-27 ENCOUNTER — Encounter (HOSPITAL_COMMUNITY): Payer: Managed Care, Other (non HMO)

## 2012-10-29 ENCOUNTER — Encounter (HOSPITAL_COMMUNITY): Payer: Managed Care, Other (non HMO)

## 2012-11-01 ENCOUNTER — Other Ambulatory Visit: Payer: Self-pay | Admitting: Family Medicine

## 2012-11-01 ENCOUNTER — Encounter (HOSPITAL_COMMUNITY)
Admission: RE | Admit: 2012-11-01 | Discharge: 2012-11-01 | Disposition: A | Discharge status: Home / Self Care | Payer: Managed Care, Other (non HMO) | Source: Ambulatory Visit | Attending: Cardiovascular Disease | Admitting: Cardiovascular Disease

## 2012-11-01 MED ORDER — CARBIDOPA-LEVODOPA 25-100 MG PO TABS: 1.5000 | ORAL_TABLET | Freq: Three times a day (TID) | ORAL | Status: DC

## 2012-11-01 NOTE — Telephone Encounter (Signed)
Needs refill on his carbidopa-levodopa (SINEMET IR) 25-100 MG - would like 90 day supply Had been filled by previous doctor - needs this by end of week  [facility]- [address]

## 2012-11-05 ENCOUNTER — Encounter (HOSPITAL_COMMUNITY)
Admission: RE | Admit: 2012-11-05 | Discharge: 2012-11-05 | Disposition: A | Discharge status: Home / Self Care | Payer: Managed Care, Other (non HMO) | Source: Ambulatory Visit | Attending: Cardiovascular Disease | Admitting: Cardiovascular Disease

## 2012-11-08 ENCOUNTER — Encounter (HOSPITAL_COMMUNITY)
Admission: RE | Admit: 2012-11-08 | Discharge: 2012-11-08 | Disposition: A | Discharge status: Home / Self Care | Payer: Managed Care, Other (non HMO) | Source: Ambulatory Visit | Attending: Cardiovascular Disease | Admitting: Cardiovascular Disease

## 2012-11-10 ENCOUNTER — Encounter (HOSPITAL_COMMUNITY): Payer: Managed Care, Other (non HMO)

## 2012-11-12 ENCOUNTER — Encounter (HOSPITAL_COMMUNITY): Payer: Managed Care, Other (non HMO)

## 2012-11-15 ENCOUNTER — Encounter (HOSPITAL_COMMUNITY): Payer: Managed Care, Other (non HMO)

## 2012-11-17 ENCOUNTER — Encounter (HOSPITAL_COMMUNITY): Payer: Managed Care, Other (non HMO)

## 2012-11-19 ENCOUNTER — Encounter (HOSPITAL_COMMUNITY): Payer: Managed Care, Other (non HMO)

## 2012-11-22 ENCOUNTER — Encounter (HOSPITAL_COMMUNITY): Payer: Managed Care, Other (non HMO)

## 2012-11-24 ENCOUNTER — Emergency Department (HOSPITAL_COMMUNITY): Payer: Managed Care, Other (non HMO)

## 2012-11-24 ENCOUNTER — Encounter (HOSPITAL_COMMUNITY): Payer: Managed Care, Other (non HMO)

## 2012-11-24 ENCOUNTER — Emergency Department (HOSPITAL_COMMUNITY)
Admission: EM | Admit: 2012-11-24 | Discharge: 2012-11-24 | Disposition: A | Discharge status: Home / Self Care | Payer: Managed Care, Other (non HMO) | Attending: Emergency Medicine | Admitting: Emergency Medicine

## 2012-11-24 ENCOUNTER — Encounter (HOSPITAL_COMMUNITY): Payer: Self-pay | Admitting: Emergency Medicine

## 2012-11-24 DIAGNOSIS — Z87442 Personal history of urinary calculi: Secondary | ICD-10-CM | POA: Insufficient documentation

## 2012-11-24 DIAGNOSIS — N200 Calculus of kidney: Secondary | ICD-10-CM | POA: Insufficient documentation

## 2012-11-24 DIAGNOSIS — R112 Nausea with vomiting, unspecified: Secondary | ICD-10-CM | POA: Insufficient documentation

## 2012-11-24 DIAGNOSIS — R300 Dysuria: Secondary | ICD-10-CM | POA: Insufficient documentation

## 2012-11-24 DIAGNOSIS — Z7982 Long term (current) use of aspirin: Secondary | ICD-10-CM | POA: Insufficient documentation

## 2012-11-24 DIAGNOSIS — Z79899 Other long term (current) drug therapy: Secondary | ICD-10-CM | POA: Insufficient documentation

## 2012-11-24 DIAGNOSIS — Z87891 Personal history of nicotine dependence: Secondary | ICD-10-CM | POA: Insufficient documentation

## 2012-11-24 DIAGNOSIS — I10 Essential (primary) hypertension: Secondary | ICD-10-CM | POA: Insufficient documentation

## 2012-11-24 DIAGNOSIS — G20A1 Parkinson's disease without dyskinesia, without mention of fluctuations: Secondary | ICD-10-CM | POA: Insufficient documentation

## 2012-11-24 DIAGNOSIS — R011 Cardiac murmur, unspecified: Secondary | ICD-10-CM | POA: Insufficient documentation

## 2012-11-24 DIAGNOSIS — G20 Parkinson's disease: Secondary | ICD-10-CM | POA: Insufficient documentation

## 2012-11-24 LAB — URINE MICROSCOPIC-ADD ON

## 2012-11-24 LAB — URINALYSIS, ROUTINE W REFLEX MICROSCOPIC
Nitrite: POSITIVE — AB
Specific Gravity, Urine: 1.02 (ref 1.005–1.030)
Urobilinogen, UA: 1 mg/dL (ref 0.0–1.0)
pH: 5 (ref 5.0–8.0)

## 2012-11-24 MED ORDER — CIPROFLOXACIN HCL 250 MG PO TABS
500.0000 mg | ORAL_TABLET | Freq: Once | ORAL | Status: AC
  Administered 2012-11-24: 500 mg via ORAL
  Filled 2012-11-24: qty 2

## 2012-11-24 MED ORDER — CIPROFLOXACIN HCL 500 MG PO TABS: 500.0000 mg | ORAL_TABLET | Freq: Two times a day (BID) | ORAL | Status: DC

## 2012-11-24 MED ORDER — HYDROCODONE-ACETAMINOPHEN 5-325 MG PO TABS: 1.0000 | ORAL_TABLET | ORAL | Status: AC | PRN

## 2012-11-24 MED ORDER — KETOROLAC TROMETHAMINE 30 MG/ML IJ SOLN
30.0000 mg | Freq: Once | INTRAMUSCULAR | Status: AC
  Administered 2012-11-24: 30 mg via INTRAVENOUS
  Filled 2012-11-24: qty 1

## 2012-11-24 MED ORDER — SODIUM CHLORIDE 0.9 % IV SOLN
Freq: Once | INTRAVENOUS | Status: AC
  Administered 2012-11-24: 04:00:00 via INTRAVENOUS

## 2012-11-24 MED ORDER — HYDROMORPHONE HCL PF 1 MG/ML IJ SOLN
1.0000 mg | Freq: Once | INTRAMUSCULAR | Status: AC
  Administered 2012-11-24: 1 mg via INTRAVENOUS
  Filled 2012-11-24: qty 1

## 2012-11-24 MED ORDER — ONDANSETRON 4 MG PO TBDP: 4.0000 mg | ORAL_TABLET | Freq: Three times a day (TID) | ORAL | Status: DC | PRN

## 2012-11-24 MED ORDER — ONDANSETRON HCL 4 MG/2ML IJ SOLN
4.0000 mg | Freq: Once | INTRAMUSCULAR | Status: AC
  Administered 2012-11-24: 4 mg via INTRAVENOUS
  Filled 2012-11-24: qty 2

## 2012-11-24 NOTE — ED Provider Notes (Signed)
History     CSN: [id number]  Arrival date & time 11/24/12  4098   First MD Initiated Contact with Patient 11/24/12 0407      Chief Complaint  Patient presents with  . Flank Pain    (Consider location/radiation/quality/duration/timing/severity/associated sxs/prior treatment) HPI Christopher Weeks is a 62 y.o. male who presents to the Emergency Department complaining of left flank pain that began at midnight and has been continuously worsening. Associated with nausea, vomiting, radiation to left lower abdomen. Last kidney stone was 5 years ago and required lithotripsy. Denies fever, chills, diarrhea.   PCP Dr. Leveda Anna Past Medical History  Diagnosis Date  . Parkinson disease   . Hypertension   . Dysrhythmia   . Heart murmur   . Kidney stones   . Parkinson's disease     Past Surgical History  Procedure Date  . Anal Personnel officer   . Coronary stent placement 07/11/12    No family history on file.  History  Substance Use Topics  . Smoking status: Former Smoker -- 1.0 packs/day for 20 years    Types: Cigarettes    Quit date: 11/10/1996  . Smokeless tobacco: Never Used  . Alcohol Use: No      Review of Systems  Constitutional: Negative for fever.       10 Systems reviewed and are negative for acute change except as noted in the HPI.  HENT: Negative for congestion.   Eyes: Negative for discharge and redness.  Respiratory: Negative for cough and shortness of breath.   Cardiovascular: Negative for chest pain.  Gastrointestinal: Positive for nausea, vomiting and abdominal pain.  Genitourinary: Positive for difficulty urinating.  Musculoskeletal: Negative for back pain.  Skin: Negative for rash.  Neurological: Negative for syncope, numbness and headaches.  Psychiatric/Behavioral:       No behavior change.    Allergies  Codeine  Home Medications   Current Outpatient Rx  Name  Route  Sig  Dispense  Refill  . VITAMIN C 500 MG PO TABS   Oral   Take 500 mg by mouth  daily.           . ASPIRIN 81 MG PO TABS   Oral   Take 81 mg by mouth daily.           . ATORVASTATIN CALCIUM 80 MG PO TABS   Oral   Take 1 tablet (80 mg total) by mouth daily at 6 PM.   30 tablet   5   . CALCIUM-VITAMIN D 600-125 MG-UNIT PO TABS      1 by mouth daily          . CARBIDOPA-LEVODOPA 25-100 MG PO TABS   Oral   Take 1.5 tablets by mouth 3 (three) times daily.   435 tablet   3   . FENOFIBRATE 54 MG PO TABS   Oral   Take 1 tablet (54 mg total) by mouth daily.   30 tablet   5   . HYDROCHLOROTHIAZIDE 25 MG PO TABS      TAKE ONE TABLET BY MOUTH EVERY DAY IN THE MORNING   90 tablet   2   . LISINOPRIL 20 MG PO TABS   Oral   Take 1 tablet (20 mg total) by mouth daily.   30 tablet   5   . MAGNESIUM OXIDE 400 MG PO TABS   Oral   Take 400 mg by mouth 2 (two) times daily.         Marland Kitchen  METOPROLOL TARTRATE 12.5 MG HALF TABLET   Oral   Take 0.5 tablets (12.5 mg total) by mouth 2 (two) times daily.   30 tablet   5   . ONE-DAILY MULTI VITAMINS PO TABS   Oral   Take 1 tablet by mouth daily.           Marland Kitchen NITROGLYCERIN 0.4 MG/SPRAY TL SOLN               . OMEGA-3 350 MG PO CAPS      2 by mouth daily          . PRASUGREL HCL 10 MG PO TABS   Oral   Take 1 tablet (10 mg total) by mouth daily.   30 tablet   10     BP 151/81  Pulse 60  Temp 98.2 F (36.8 C) (Oral)  Resp 22  Ht 6' (1.829 m)  Wt 209 lb (94.802 kg)  BMI 28.35 kg/m2  SpO2 100%  Physical Exam  Constitutional: He is oriented to person, place, and time. He appears well-developed and well-nourished.       uncomfortable  HENT:  Head: Normocephalic.  Right Ear: External ear normal.  Left Ear: External ear normal.  Nose: Nose normal.  Mouth/Throat: Oropharynx is clear and moist.  Neck: Normal range of motion.  Cardiovascular: Normal rate and intact distal pulses.   Pulmonary/Chest: Breath sounds normal.  Abdominal: Bowel sounds are normal.  Genitourinary:       No cva  tenderness  Musculoskeletal: Normal range of motion.  Neurological: He is alert and oriented to person, place, and time. He has normal reflexes.    ED Course  Procedures (including critical care time) Results for orders placed during the hospital encounter of 11/24/12  URINALYSIS, ROUTINE W REFLEX MICROSCOPIC      Component Value Range   Color, Urine BROWN (*) YELLOW   APPearance CLEAR  CLEAR   Specific Gravity, Urine 1.020  1.005 - 1.030   pH 5.0  5.0 - 8.0   Glucose, UA 100 (*) NEGATIVE mg/dL   Hgb urine dipstick LARGE (*) NEGATIVE   Bilirubin Urine NEGATIVE  NEGATIVE   Ketones, ur 15 (*) NEGATIVE mg/dL   Protein, ur >161 (*) NEGATIVE mg/dL   Urobilinogen, UA 1.0  0.0 - 1.0 mg/dL   Nitrite POSITIVE (*) NEGATIVE   Leukocytes, UA MODERATE (*) NEGATIVE  URINE MICROSCOPIC-ADD ON      Component Value Range   WBC, UA TOO NUMEROUS TO COUNT  <3 WBC/hpf   RBC / HPF TOO NUMEROUS TO COUNT  <3 RBC/hpf   Bacteria, UA MANY (*) RARE    Ct Abdomen Pelvis Wo Contrast  11/24/2012  *RADIOLOGY REPORT*  Clinical Data: Left flank pain.  Previous history of kidney stones.  CT ABDOMEN AND PELVIS WITHOUT CONTRAST  Technique:  Multidetector CT imaging of the abdomen and pelvis was performed following the standard protocol without intravenous contrast.  Comparison: 11/25/2009  Findings: The lung bases are clear.  There is a punctate sized stone in the proximal left ureter at the level of L3.  There is minimal if any proximal pyelocaliectasis and ureterectasis but there is some pararenal stranding on the left. Changes suggest mild obstruction.  The distal ureter is decompressed.  No additional ureteral or bladder stones are appreciated.  Vague calcification in the upper pole of the left kidney may also represent a small nonobstructing stone.  No evidence of obstruction of the right ureter.  Cholelithiasis.  The unenhanced  appearance of the liver, spleen, pancreas, right adrenal gland, and retroperitoneal lymph  nodes is unremarkable.  Calcification of the abdominal aorta without aneurysm.  Nodular enlargement of the left adrenal gland, measuring about 1.3 cm.  This is stable and likely represents an adenoma. Small accessory spleen.  The stomach, small bowel, and colon are not abnormally distended.  Prominent visceral adipose tissues.  No free air or free fluid in the abdomen.  Pelvis:  The stool filled colon.  No evidence of diverticulitis. The appendix is normal.  Prostate gland is not enlarged.  No free or loculated pelvic fluid collections.  No significant pelvic lymphadenopathy.  Mild degenerative changes in the lumbar spine.  IMPRESSION: Punctate sized stone in the proximal/mid left ureter with evidence of mild obstruction.  Nonobstructing punctate sized intrarenal stone on the left.  Cholelithiasis.   Original Report Authenticated By: Burman Nieves, M.D.      MDM  Patient presents with left flank pain similar to previous kidney stones. CT shows a punctate stone in the proximal mid ureter with mild obstruction. Given IVF, analgesic, Toradol, Zofran with relief. Reviewed results with patient nd his wife. Referral to urology. Pt stable in ED with no significant deterioration in condition.The patient appears reasonably screened and/or stabilized for discharge and I doubt any other medical condition or other [facility] requiring further screening, evaluation, or treatment in the ED at this time prior to discharge. Pt feels improved after observation and/or treatment in ED.Pt stable in ED with no significant deterioration in condition.The patient appears reasonably screened and/or stabilized for discharge and I doubt any other medical condition or other [facility] requiring further screening, evaluation, or treatment in the ED at this time prior to discharge.  MDM Reviewed: nursing note Interpretation: labs and x-ray           Nicoletta Dress. Colon Branch, MD 11/24/12 0600

## 2012-11-24 NOTE — ED Notes (Addendum)
Pt reporting pain in left side.  Also having nausea. Pt reports he does have history of kidney stones, and pain is very similar.

## 2012-11-24 NOTE — ED Notes (Signed)
Patient states left flank pain started around midnight; states has history of kidney stones and feels the same.

## 2012-11-25 LAB — URINE CULTURE

## 2012-11-26 ENCOUNTER — Encounter (HOSPITAL_COMMUNITY): Payer: Managed Care, Other (non HMO)

## 2012-11-26 ENCOUNTER — Telehealth: Payer: Self-pay | Admitting: Family Medicine

## 2012-11-26 NOTE — Telephone Encounter (Signed)
Forwarded to pcp.Elysa Womac Lynetta  

## 2012-11-26 NOTE — Telephone Encounter (Signed)
Called and discussed

## 2012-11-26 NOTE — Telephone Encounter (Signed)
Wife is calling to let Dr. Leveda Anna know that Christopher Weeks went to the ER and was told he had kidney stones and they suggested he go off of his blood thinners until he passes all of them.  She just wants to let Dr. Leveda Anna know and would like to know if he has any other suggestions.

## 2012-11-29 ENCOUNTER — Encounter (HOSPITAL_COMMUNITY)
Admission: RE | Admit: 2012-11-29 | Discharge: 2012-11-29 | Disposition: A | Discharge status: Home / Self Care | Payer: Managed Care, Other (non HMO) | Source: Ambulatory Visit | Attending: Cardiovascular Disease | Admitting: Cardiovascular Disease

## 2012-11-29 DIAGNOSIS — I252 Old myocardial infarction: Secondary | ICD-10-CM | POA: Insufficient documentation

## 2012-11-29 DIAGNOSIS — Z5189 Encounter for other specified aftercare: Secondary | ICD-10-CM | POA: Insufficient documentation

## 2012-11-29 DIAGNOSIS — Z9861 Coronary angioplasty status: Secondary | ICD-10-CM | POA: Insufficient documentation

## 2012-12-01 ENCOUNTER — Encounter (HOSPITAL_COMMUNITY): Payer: Managed Care, Other (non HMO)

## 2012-12-03 ENCOUNTER — Encounter (HOSPITAL_COMMUNITY)
Admission: RE | Admit: 2012-12-03 | Discharge: 2012-12-03 | Disposition: A | Discharge status: Home / Self Care | Payer: Managed Care, Other (non HMO) | Source: Ambulatory Visit | Attending: Cardiovascular Disease | Admitting: Cardiovascular Disease

## 2012-12-06 ENCOUNTER — Encounter (HOSPITAL_COMMUNITY)
Admission: RE | Admit: 2012-12-06 | Discharge: 2012-12-06 | Disposition: A | Discharge status: Home / Self Care | Payer: Managed Care, Other (non HMO) | Source: Ambulatory Visit | Attending: Cardiovascular Disease | Admitting: Cardiovascular Disease

## 2012-12-08 ENCOUNTER — Encounter (HOSPITAL_COMMUNITY): Payer: Managed Care, Other (non HMO)

## 2012-12-10 ENCOUNTER — Encounter (HOSPITAL_COMMUNITY)
Admission: RE | Admit: 2012-12-10 | Discharge: 2012-12-10 | Disposition: A | Discharge status: Home / Self Care | Payer: Managed Care, Other (non HMO) | Source: Ambulatory Visit | Attending: Cardiovascular Disease | Admitting: Cardiovascular Disease

## 2012-12-13 ENCOUNTER — Encounter (HOSPITAL_COMMUNITY)
Admission: RE | Admit: 2012-12-13 | Discharge: 2012-12-13 | Disposition: A | Discharge status: Home / Self Care | Payer: Managed Care, Other (non HMO) | Source: Ambulatory Visit | Attending: Cardiovascular Disease | Admitting: Cardiovascular Disease

## 2012-12-13 DIAGNOSIS — Z9861 Coronary angioplasty status: Secondary | ICD-10-CM | POA: Insufficient documentation

## 2012-12-13 DIAGNOSIS — Z5189 Encounter for other specified aftercare: Secondary | ICD-10-CM | POA: Insufficient documentation

## 2012-12-13 DIAGNOSIS — I252 Old myocardial infarction: Secondary | ICD-10-CM | POA: Insufficient documentation

## 2012-12-15 ENCOUNTER — Encounter (HOSPITAL_COMMUNITY)
Admission: RE | Admit: 2012-12-15 | Discharge: 2012-12-15 | Disposition: A | Discharge status: Home / Self Care | Payer: Managed Care, Other (non HMO) | Source: Ambulatory Visit | Attending: Cardiovascular Disease | Admitting: Cardiovascular Disease

## 2012-12-17 ENCOUNTER — Encounter (HOSPITAL_COMMUNITY)
Admission: RE | Admit: 2012-12-17 | Discharge: 2012-12-17 | Disposition: A | Discharge status: Home / Self Care | Payer: Managed Care, Other (non HMO) | Source: Ambulatory Visit | Attending: Cardiovascular Disease | Admitting: Cardiovascular Disease

## 2012-12-20 ENCOUNTER — Encounter (HOSPITAL_COMMUNITY)
Admission: RE | Admit: 2012-12-20 | Discharge: 2012-12-20 | Disposition: A | Discharge status: Home / Self Care | Payer: Managed Care, Other (non HMO) | Source: Ambulatory Visit | Attending: Cardiovascular Disease | Admitting: Cardiovascular Disease

## 2012-12-22 ENCOUNTER — Encounter (HOSPITAL_COMMUNITY)
Admission: RE | Admit: 2012-12-22 | Discharge: 2012-12-22 | Disposition: A | Discharge status: Home / Self Care | Payer: Managed Care, Other (non HMO) | Source: Ambulatory Visit | Attending: Cardiovascular Disease | Admitting: Cardiovascular Disease

## 2012-12-24 ENCOUNTER — Encounter (HOSPITAL_COMMUNITY): Payer: Managed Care, Other (non HMO)

## 2012-12-25 ENCOUNTER — Other Ambulatory Visit: Payer: Self-pay

## 2012-12-27 ENCOUNTER — Encounter (HOSPITAL_COMMUNITY)
Admission: RE | Admit: 2012-12-27 | Discharge: 2012-12-27 | Disposition: A | Discharge status: Home / Self Care | Payer: Managed Care, Other (non HMO) | Source: Ambulatory Visit | Attending: Cardiovascular Disease | Admitting: Cardiovascular Disease

## 2012-12-29 ENCOUNTER — Encounter (HOSPITAL_COMMUNITY)
Admission: RE | Admit: 2012-12-29 | Discharge: 2012-12-29 | Disposition: A | Discharge status: Home / Self Care | Payer: Managed Care, Other (non HMO) | Source: Ambulatory Visit | Attending: Cardiovascular Disease | Admitting: Cardiovascular Disease

## 2012-12-31 ENCOUNTER — Encounter (HOSPITAL_COMMUNITY): Payer: Managed Care, Other (non HMO)

## 2013-01-03 ENCOUNTER — Encounter (HOSPITAL_COMMUNITY): Payer: Managed Care, Other (non HMO)

## 2013-02-11 ENCOUNTER — Telehealth: Payer: Self-pay | Admitting: Family Medicine

## 2013-02-11 NOTE — Telephone Encounter (Signed)
Patients wife is calling wishing to discuss the possibility of eliminating some of his meds due to him not having Medicaid anymore.

## 2013-02-11 NOTE — Telephone Encounter (Signed)
Pt's wife states that at last visit, Dr Leveda Anna was going to discontinued some of him medication.I instructed patient's wife  to schedule  a follow up vist to discuss this ,she requested that I send you this message wishing you could address problem without office visit.please advise. Christopher Weeks, [name]

## 2013-02-11 NOTE — Telephone Encounter (Signed)
Called and discussed.  It sounds like he is doing well with weight loss and good exercise.  He needs to be on effient or plavix and ASA for at least one year post cath.  May be able to stop fenofibrate with weight loss. These changes would need a visit.

## 2013-05-09 ENCOUNTER — Other Ambulatory Visit: Payer: Self-pay | Admitting: Family Medicine

## 2013-05-09 NOTE — Telephone Encounter (Signed)
Needs appointment soon.  He is overdue

## 2013-05-20 ENCOUNTER — Encounter: Payer: Self-pay | Admitting: Family Medicine

## 2013-05-20 ENCOUNTER — Ambulatory Visit (INDEPENDENT_AMBULATORY_CARE_PROVIDER_SITE_OTHER): Payer: Self-pay | Admitting: Family Medicine

## 2013-05-20 VITALS — BP 112/70 | HR 53 | Temp 98.0°F | Ht 72.0 in | Wt 211.4 lb

## 2013-05-20 DIAGNOSIS — R7309 Other abnormal glucose: Secondary | ICD-10-CM

## 2013-05-20 DIAGNOSIS — G20 Parkinson's disease: Secondary | ICD-10-CM

## 2013-05-20 DIAGNOSIS — E785 Hyperlipidemia, unspecified: Secondary | ICD-10-CM

## 2013-05-20 DIAGNOSIS — R7303 Prediabetes: Secondary | ICD-10-CM

## 2013-05-20 DIAGNOSIS — I2510 Atherosclerotic heart disease of native coronary artery without angina pectoris: Secondary | ICD-10-CM

## 2013-05-20 LAB — POCT GLYCOSYLATED HEMOGLOBIN (HGB A1C): Hemoglobin A1C: 5.8

## 2013-05-20 MED ORDER — CARBIDOPA-LEVODOPA 25-100 MG PO TABS: 2.0000 | ORAL_TABLET | Freq: Three times a day (TID) | ORAL | Status: DC

## 2013-05-20 NOTE — Assessment & Plan Note (Signed)
Needs plavix through August

## 2013-05-20 NOTE — Progress Notes (Signed)
  Subjective:    Patient ID: Christopher Weeks, male    DOB: 1951-04-08, 62 y.o.   MRN: [id number]  HPI Recheck multiple problems.  He has been determined to be SSI disabled.  No income otherwise.  No current insurance.  Applying for orange card.   No chest pain.  Stent 9/13. Wt down. Eating healthy. Cannot afford zostavax. Parkinson control ok.  Would like to try higher dose of sinemet Wants to simplify med list    Review of Systems     Objective:   Physical Exam Low pulse and lowish BP noted. Mild tremor Right hand Gait OK Cardiac RRR without m or g Abd benign        Assessment & Plan:

## 2013-05-20 NOTE — Assessment & Plan Note (Signed)
Improved with wt loss

## 2013-05-20 NOTE — Assessment & Plan Note (Signed)
Increase sinemet

## 2013-05-20 NOTE — Assessment & Plan Note (Signed)
With weight loss, will try off fenofibrate and retest.  Stay on statin

## 2013-05-20 NOTE — Patient Instructions (Addendum)
You need to be on Plavix for one year after the stent.  In September, you can stop plavix - be sure to stay on aspirin. Stop the metoprolol - it is causing your heart rate to be too low. Stop the fenofibrate - I have ordered a fasting blood work done.  Please get it done after you have gotten the orange card.  I may need to restart the fenofibrate.   OK to increase sinemet to 2 pills three times a day.  I sent a new prescription so they give you more pills.   The diabetes test was excellent.  No diabetes and better than last year.

## 2013-06-01 ENCOUNTER — Ambulatory Visit: Payer: Self-pay | Admitting: Family Medicine

## 2013-07-07 ENCOUNTER — Encounter: Payer: Self-pay | Admitting: *Deleted

## 2013-07-12 ENCOUNTER — Encounter: Payer: Self-pay | Admitting: Cardiovascular Disease

## 2013-07-13 ENCOUNTER — Encounter: Payer: Self-pay | Admitting: Cardiovascular Disease

## 2013-07-13 ENCOUNTER — Ambulatory Visit (INDEPENDENT_AMBULATORY_CARE_PROVIDER_SITE_OTHER): Payer: Medicaid Other | Admitting: Cardiovascular Disease

## 2013-07-13 VITALS — BP 130/80 | HR 57 | Resp 16 | Ht 69.0 in | Wt 164.0 lb

## 2013-07-13 DIAGNOSIS — Z79899 Other long term (current) drug therapy: Secondary | ICD-10-CM

## 2013-07-13 DIAGNOSIS — E782 Mixed hyperlipidemia: Secondary | ICD-10-CM

## 2013-07-13 DIAGNOSIS — I10 Essential (primary) hypertension: Secondary | ICD-10-CM

## 2013-07-13 DIAGNOSIS — I2510 Atherosclerotic heart disease of native coronary artery without angina pectoris: Secondary | ICD-10-CM

## 2013-07-13 DIAGNOSIS — E785 Hyperlipidemia, unspecified: Secondary | ICD-10-CM

## 2013-07-13 NOTE — Patient Instructions (Addendum)
Stop Plavix (Clopidogrel).  Your physician recommends that you have fasting lab work done at [facility] in [place].  Your physician recommends that you schedule a follow-up appointment in: One year.

## 2013-07-14 NOTE — Assessment & Plan Note (Addendum)
Stop taking clopidogrel. Continue taking aspirin lifelong. The focus should be on risk factor modification. He is congratulated for the outstanding job he is losing weight. He weighs 40 pounds less than he did when he presented with unstable angina. Suspect that his "borderline diabetes" has resolved. We'll repeat a lipid profile. I'm not sure what to make of his new electrocardiographic changes. The Q waves are very deep but fairly sharp, suggesting they may be due to an conduction abnormality rather than to true inferior wall infarction. He has also developed an RSR prime pattern in lead V1 that supports the possibility of a conduction abnormality. She does not have any significant deviation of the QRS axis. As he has no complaints of angina or dyspnea on not sure imaging evaluation is justified.

## 2013-07-14 NOTE — Assessment & Plan Note (Signed)
He never did repeat the lipid profile as we had requested. This time to do this. I do think you require statin therapy even though he has lost so much weight. I don't think a statin has anything to do with his worsening tremor. He clearly has early onset and progressive Parkinson's disease.

## 2013-07-14 NOTE — Progress Notes (Signed)
Patient ID: Christopher Weeks, male   DOB: 1951-03-05, 62 y.o.   MRN: [id number]     Reason for office visit CAD  It has been one year since they'll presented with unstable angina and received a drug-eluting stent to the midright coronary artery for a high-grade ulcerated stenosis. He also had an 80% stenosis in the first diagonal artery a 60% stenosis in the proximal left circumflex coronary artery and then another 60% stenosis downstream of the stented lesion in the right coronary artery.  He has done excellent from a cardiac standpoint without any recurrent events. He walks 1-1/2 miles a day every day at a brisk pace. He denies any positive chest pain or shortness of breath. He does not have intermittent claudication but does have erectile dysfunction.  He is most troubled by worsening Parkinson's disease-related tremor. He finds this embarrassing. At night sometimes the tremor is so severe that it wakes him from sleep.  Allergies  Allergen Reactions  . Codeine Hives    Current Outpatient Prescriptions  Medication Sig Dispense Refill  . aspirin 81 MG tablet Take 81 mg by mouth daily.        Marland Kitchen atorvastatin (LIPITOR) 80 MG tablet Take 1 tablet (80 mg total) by mouth daily at 6 PM.  30 tablet  5  . Calcium-Vitamin D 600-125 MG-UNIT TABS 1 by mouth daily       . carbidopa-levodopa (SINEMET IR) 25-100 MG per tablet Take 2 tablets by mouth 3 (three) times daily.  540 tablet  3  . clopidogrel (PLAVIX) 75 MG tablet Take 75 mg by mouth daily.      . hydrochlorothiazide (HYDRODIURIL) 25 MG tablet TAKE ONE TABLET BY MOUTH EVERY DAY IN THE MORNING  90 tablet  2  . lisinopril (PRINIVIL,ZESTRIL) 20 MG tablet Take 1 tablet (20 mg total) by mouth daily.  30 tablet  5  . Multiple Vitamin (MULTIVITAMIN) tablet Take 1 tablet by mouth daily.        . nitroGLYCERIN (NITROLINGUAL) 0.4 MG/SPRAY spray        No current facility-administered medications for this visit.    Past Medical History  Diagnosis Date    . Parkinson disease   . Hypertension   . Dysrhythmia   . Heart murmur   . Kidney stones   . Parkinson's disease     Past Surgical History  Procedure Laterality Date  . Anal Personnel officer    . Coronary stent placement  07/11/12  . Cardiac catheterization  07/11/2012    high graded stenoses small ift circ and small 1st diag   which were  left for medicl therapy, lv systoilc fx preserved  . Coronary angioplasty  07/11/2012    90% and 70% lesions RCA  3.0 x32 mm Promus DES    Family History  Problem Relation Age of Onset  . Adopted: Yes    History   Social History  . Marital Status: Married    Spouse Name: N/A    Number of Children: N/A  . Years of Education: N/A   Occupational History  . Not on file.   Social History Main Topics  . Smoking status: Former Smoker -- 1.00 packs/day for 20 years    Types: Cigarettes    Quit date: 11/10/1996  . Smokeless tobacco: Never Used  . Alcohol Use: No  . Drug Use: No  . Sexual Activity: Yes     Comment: monogamous   Other Topics Concern  . Not on file  Social History Narrative  . No narrative on file    Review of systems: The patient specifically denies any chest pain at rest or with exertion, dyspnea at rest or with exertion, orthopnea, paroxysmal nocturnal dyspnea, syncope, palpitations, focal neurological deficits, intermittent claudication, lower extremity edema, unexplained weight gain, cough, hemoptysis or wheezing.  The patient also denies abdominal pain, nausea, vomiting, dysphagia, diarrhea, constipation, polyuria, polydipsia, dysuria, hematuria, frequency, urgency, abnormal bleeding or bruising, fever, chills, unexpected weight changes, mood swings, change in skin or hair texture, change in voice quality, auditory or visual problems, allergic reactions or rashes, new musculoskeletal complaints other than usual "aches and pains".   PHYSICAL EXAM BP 130/80  Pulse 57  Resp 16  Ht 5\' 9"  (1.753 m)  Wt 164 lb (74.39 kg)   BMI 24.21 kg/m2  General: Alert, oriented x3, no distress Head: no evidence of trauma, PERRL, EOMI, no exophtalmos or lid lag, no myxedema, no xanthelasma; normal ears, nose and oropharynx Neck: normal jugular venous pulsations and no hepatojugular reflux; brisk carotid pulses without delay and no carotid bruits Chest: clear to auscultation, no signs of consolidation by percussion or palpation, normal fremitus, symmetrical and full respiratory excursions Cardiovascular: normal position and quality of the apical impulse, regular rhythm, normal first and second heart sounds, no murmurs, rubs or gallops Abdomen: no tenderness or distention, no masses by palpation, no abnormal pulsatility or arterial bruits, normal bowel sounds, no hepatosplenomegaly Extremities: no clubbing, cyanosis or edema; 2+ radial, ulnar and brachial pulses bilaterally; 2+ right femoral, posterior tibial and dorsalis pedis pulses; 2+ left femoral, posterior tibial and dorsalis pedis pulses; no subclavian or femoral bruits Neurological: grossly nonfocal   EKG: Sinus rhythm, very deep Q waves in lead 3 and F. and a smaller Q wave in lead 2 and V3-V6. These are new when compared with his old electrocardiogram. No ST segment changes are seen  Lipid Panel     Component Value Date/Time   CHOL 235* 07/11/2012 0232   TRIG 203* 07/11/2012 0232   HDL 36* 07/11/2012 0232   CHOLHDL 6.5 07/11/2012 0232   VLDL 41* 07/11/2012 0232   LDLCALC 158* 07/11/2012 0232    BMET    Component Value Date/Time   NA 137 07/12/2012 0500   K 3.8 07/12/2012 0500   CL 101 07/12/2012 0500   CO2 23 07/12/2012 0500   GLUCOSE 117* 07/12/2012 0500   BUN 14 07/12/2012 0500   CREATININE 0.99 07/12/2012 0500   CREATININE 0.95 12/19/2011 1118   CALCIUM 9.6 07/12/2012 0500   GFRNONAA 88* 07/12/2012 0500   GFRAA >90 07/12/2012 0500     ASSESSMENT AND PLAN CAD s/p DES RCA (07/2012 - Promus Element 3.0x32) Stop taking clopidogrel. Continue taking aspirin lifelong. The focus  should be on risk factor modification. He is congratulated for the outstanding job he is losing weight. He weighs 40 pounds less than he did when he presented with unstable angina. Suspect that his "borderline diabetes" has resolved. We'll repeat a lipid profile. I'm not sure what to make of his new electrocardiographic changes. The Q waves are very deep but fairly sharp, suggesting they may be due to an conduction abnormality rather than to true inferior wall infarction. He has also developed an RSR prime pattern in lead V1 that supports the possibility of a conduction abnormality. She does not have any significant deviation of the QRS axis. As he has no complaints of angina or dyspnea on not sure imaging evaluation is justified.  Essential hypertension,  benign Good control  HYPERLIPIDEMIA He never did repeat the lipid profile as we had requested. This time to do this. I do think you require statin therapy even though he has lost so much weight. I don't think a statin has anything to do with his worsening tremor. He clearly has early onset and progressive Parkinson's disease.   Orders Placed This Encounter  Procedures  . Lipid Profile  . Comp Met (CMET)  . EKG 12-Lead   No orders of the defined types were placed in this encounter.    Junious Silk, MD, [facility] [phone] office [phone] pager

## 2013-07-14 NOTE — Assessment & Plan Note (Signed)
Good control

## 2013-07-29 LAB — COMPREHENSIVE METABOLIC PANEL
BUN: 18 mg/dL (ref 6–23)
CO2: 29 mEq/L (ref 19–32)
Glucose, Bld: 111 mg/dL — ABNORMAL HIGH (ref 70–99)
Sodium: 140 mEq/L (ref 135–145)
Total Bilirubin: 1 mg/dL (ref 0.3–1.2)
Total Protein: 7.3 g/dL (ref 6.0–8.3)

## 2013-07-29 LAB — LIPID PANEL
Cholesterol: 149 mg/dL (ref 0–200)
HDL: 32 mg/dL — ABNORMAL LOW (ref >39)
Triglycerides: 133 mg/dL (ref <150)

## 2013-09-15 ENCOUNTER — Other Ambulatory Visit: Payer: Self-pay

## 2013-09-20 NOTE — Addendum Note (Signed)
Encounter addended by: Angelica Pou, RN on: 09/20/2013 11:41 AM<BR>     Documentation filed: Clinical Notes

## 2013-09-20 NOTE — Addendum Note (Signed)
Encounter addended by: Angelica Pou, RN on: 09/20/2013 11:29 AM<BR>     Documentation filed: Clinical Notes

## 2013-09-20 NOTE — Progress Notes (Signed)
Cardiac Rehabilitation Program Outcomes Report   Orientation:  08/26/2012 Halfway Report: 10/13/2012 Graduate Date:  tbd Discharge Date:  tbd # of sessions completed: 18  Cardiologist: Croitoru Family MD:  Hencel Class Time:  09:30  A.  Exercise Program:  Tolerates exercise @ 4.04 METS for 15 minutes  B.  Mental Health:  Good mental attitude  C.  Education/Instruction/Skills  Accurately checks own pulse.  Rest:  52  Exercise:  79, Knows THR for exercise and Uses Perceived Exertion Scale and/or Dyspnea Scale  Uses Perceived Exertion Scale and/or Dyspnea Scale  D.  Nutrition/Weight Control/Body Composition:  Adherence to prescribed nutrition program: good    E.  Blood Lipids    Lab Results  Component Value Date   CHOL 149 07/29/2013   HDL 32* 07/29/2013   LDLCALC 90 07/29/2013   TRIG 133 07/29/2013   CHOLHDL 4.7 07/29/2013    F.  Lifestyle Changes:  Making positive lifestyle changes  G.  Symptoms noted with exercise:  Asymptomatic  Report Completed By:  Lelon Huh. Nyles Mitton RN   Comments:   This is patients halfway report. He achieved a peak METS of 4.04. His resting HR was 52 and resting BP was 102/62, His peak hr was 79,and peak BP was 130/60. He has done well. A graduation report will follow upon his 36th visit.

## 2013-09-20 NOTE — Addendum Note (Signed)
Encounter addended by: Angelica Pou, RN on: 09/20/2013 11:27 AM<BR>     Documentation filed: Notes Section

## 2013-09-20 NOTE — Addendum Note (Signed)
Encounter addended by: Angelica Pou, RN on: 09/20/2013 11:39 AM<BR>     Documentation filed: Notes Section

## 2013-09-20 NOTE — Addendum Note (Signed)
Encounter addended by: Angelica Pou, RN on: 09/20/2013 11:17 AM<BR>     Documentation filed: Notes Section

## 2013-09-20 NOTE — Addendum Note (Signed)
Encounter addended by: Angelica Pou, RN on: 09/20/2013 11:18 AM<BR>     Documentation filed: Clinical Notes

## 2013-09-20 NOTE — Progress Notes (Signed)
Cardiac Rehabilitation Program Outcomes Report   Orientation:  08/26/2012 1st week:  09/03/2012 Graduate Date:  tbd Discharge Date:  tbd # of sessions completed: 3 DX; Stent X1/ MI  Cardiologist: Croitoru Family MD:  Hencel Class Time:  09:30  A.  Exercise Program:  Tolerates exercise @ 4.04 METS for 15 minutes and Walk Test Results:  Pre: Pre walk Test: Resting HR 47, BP 110/80, O2 99%, RPE 7, and RPD 7. 6 min HR 74, BP 152/80, O2 98%, RPE 11,and RPD 11, Post HR 55, BP 102/60, O2 99%, RPE 7 and RPD 7. Walked 1450 ft at 2.7 mph at 3.1 METS  B.  Mental Health:  Good mental attitude  C.  Education/Instruction/Skills  Accurately checks own pulse.  Rest:  75  Exercise:  83, Knows THR for exercise and Uses Perceived Exertion Scale and/or Dyspnea Scale  Uses Perceived Exertion Scale and/or Dyspnea Scale  D.  Nutrition/Weight Control/Body Composition:  Adherence to prescribed nutrition program: good    E.  Blood Lipids    Lab Results  Component Value Date   CHOL 149 07/29/2013   HDL 32* 07/29/2013   LDLCALC 90 07/29/2013   TRIG 133 07/29/2013   CHOLHDL 4.7 07/29/2013    F.  Lifestyle Changes:  Making positive lifestyle changes  G.  Symptoms noted with exercise:  Asymptomatic  Report Completed By: Lelon Huh. Olubunmi Rothenberger RN   Comments: This is patients 1st week report . Patient has done well his first week. He achieved a peak METS of 4.04. His resting HR was 75 and BP was 110/62, His peak HR was 83 and His peak BP was 134/60. A report will follow upon his half way point his 18th visit.

## 2013-09-20 NOTE — Progress Notes (Signed)
Cardiac Rehabilitation Program Outcomes Report   Orientation:  08/26/2012 Graduate Date:  12/29/2012 Discharge Date:  12/29/2012 # of sessions completed: 36  Cardiologist: Croitoru Family MD:  Hencel Class Time:  09:30  A.  Exercise Program:  Tolerates exercise @ 4.04 METS for 15 minutes  B.  Mental Health:  Good mental attitude  C.  Education/Instruction/Skills  Accurately checks own pulse.  Rest:  53  Exercise:   77, Knows THR for exercise, Uses Perceived Exertion Scale and/or Dyspnea Scale and Attended 11 education classes  Uses Perceived Exertion Scale and/or Dyspnea Scale  D.  Nutrition/Weight Control/Body Composition:  Adherence to prescribed nutrition program: good    E.  Blood Lipids    Lab Results  Component Value Date   CHOL 149 07/29/2013   HDL 32* 07/29/2013   LDLCALC 90 07/29/2013   TRIG 133 07/29/2013   CHOLHDL 4.7 07/29/2013    F.  Lifestyle Changes:  Making positive lifestyle changes  G.  Symptoms noted with exercise:  Asymptomatic  Report Completed By:  Lelon Huh. Brandell Maready RN   Comments:  THis is patients graduation report. He achieved a peak METS of 4.04. His resting HR was 53 and His resting BP was 108/58, His peak HR was 77 and peak BP was 122/70. He has done well while in rehab.Will keep in touch with patients thru our callback system at 1 month, 6 months, and at 1 year .

## 2013-10-14 ENCOUNTER — Other Ambulatory Visit: Payer: Self-pay | Admitting: *Deleted

## 2013-10-14 MED ORDER — LISINOPRIL 20 MG PO TABS: 20.0000 mg | ORAL_TABLET | Freq: Every day | ORAL | Status: DC

## 2013-10-14 NOTE — Telephone Encounter (Signed)
Rx was sent to pharmacy electronically. 

## 2013-10-26 ENCOUNTER — Ambulatory Visit (INDEPENDENT_AMBULATORY_CARE_PROVIDER_SITE_OTHER): Payer: Medicaid Other | Admitting: Family Medicine

## 2013-10-26 ENCOUNTER — Encounter: Payer: Self-pay | Admitting: Family Medicine

## 2013-10-26 VITALS — BP 112/63 | HR 60 | Temp 99.1°F | Ht 72.0 in | Wt 209.0 lb

## 2013-10-26 DIAGNOSIS — G20A1 Parkinson's disease without dyskinesia, without mention of fluctuations: Secondary | ICD-10-CM

## 2013-10-26 DIAGNOSIS — E785 Hyperlipidemia, unspecified: Secondary | ICD-10-CM

## 2013-10-26 DIAGNOSIS — I10 Essential (primary) hypertension: Secondary | ICD-10-CM

## 2013-10-26 DIAGNOSIS — I2510 Atherosclerotic heart disease of native coronary artery without angina pectoris: Secondary | ICD-10-CM

## 2013-10-26 DIAGNOSIS — R7303 Prediabetes: Secondary | ICD-10-CM

## 2013-10-26 DIAGNOSIS — G20 Parkinson's disease: Secondary | ICD-10-CM

## 2013-10-26 DIAGNOSIS — R7309 Other abnormal glucose: Secondary | ICD-10-CM

## 2013-10-26 DIAGNOSIS — Z23 Encounter for immunization: Secondary | ICD-10-CM

## 2013-10-26 MED ORDER — ATORVASTATIN CALCIUM 80 MG PO TABS: 80.0000 mg | ORAL_TABLET | Freq: Every day | ORAL | Status: DC

## 2013-10-26 MED ORDER — ZOSTER VACCINE LIVE 19400 UNT/0.65ML ~~LOC~~ SOLR: 0.6500 mL | Freq: Once | SUBCUTANEOUS | Status: DC

## 2013-10-26 NOTE — Patient Instructions (Signed)
You look good and I like the weight loss You need to stay on the atorvastatin/lipitor.  Very important. Thanks for the Christmas cookies. I will call with the blood work results. You let me know when you get the shingles vaccine so that I can update my records.

## 2013-10-27 NOTE — Assessment & Plan Note (Signed)
Well controled. 

## 2013-10-27 NOTE — Progress Notes (Signed)
   Subjective:    Patient ID: Christopher Weeks, male    DOB: 06-12-1951, 62 y.o.   MRN: [id number]  HPI  Now has disability due to Parkinsons and CAD.   Now has private insurance through [employer] website at reasonable cost. Not taking statin (they, esp wife, likes natural remedies).  Reviewed data and strongly encouraged to restart. No chest pain. Taking better care with decreased wt and more activity. Needs flu and shingles vaccines.    Review of Systems     Objective:   Physical ExamLungs clear Cardiac RRR without m or g  abd benign        Assessment & Plan:

## 2013-10-27 NOTE — Assessment & Plan Note (Signed)
stable °

## 2013-10-27 NOTE — Assessment & Plan Note (Signed)
Congratulated on weight loss 

## 2013-10-27 NOTE — Assessment & Plan Note (Signed)
Cards stopped plavix (stents now > 60 year old) Continue ASA. Restart statin.

## 2013-10-27 NOTE — Assessment & Plan Note (Signed)
Restart statin  

## 2013-11-02 ENCOUNTER — Other Ambulatory Visit: Payer: Self-pay | Admitting: Cardiovascular Disease

## 2014-04-10 ENCOUNTER — Emergency Department (HOSPITAL_COMMUNITY)
Admission: EM | Admit: 2014-04-10 | Discharge: 2014-04-10 | Disposition: A | Discharge status: Home / Self Care | Payer: BC Managed Care – PPO | Attending: Emergency Medicine | Admitting: Emergency Medicine

## 2014-04-10 ENCOUNTER — Emergency Department (HOSPITAL_COMMUNITY): Payer: BC Managed Care – PPO

## 2014-04-10 ENCOUNTER — Encounter (HOSPITAL_COMMUNITY): Payer: Self-pay | Admitting: Emergency Medicine

## 2014-04-10 DIAGNOSIS — Z87442 Personal history of urinary calculi: Secondary | ICD-10-CM | POA: Insufficient documentation

## 2014-04-10 DIAGNOSIS — Y929 Unspecified place or not applicable: Secondary | ICD-10-CM | POA: Insufficient documentation

## 2014-04-10 DIAGNOSIS — I10 Essential (primary) hypertension: Secondary | ICD-10-CM | POA: Insufficient documentation

## 2014-04-10 DIAGNOSIS — S0083XA Contusion of other part of head, initial encounter: Secondary | ICD-10-CM | POA: Insufficient documentation

## 2014-04-10 DIAGNOSIS — S0990XA Unspecified injury of head, initial encounter: Secondary | ICD-10-CM | POA: Insufficient documentation

## 2014-04-10 DIAGNOSIS — R011 Cardiac murmur, unspecified: Secondary | ICD-10-CM | POA: Insufficient documentation

## 2014-04-10 DIAGNOSIS — Z87891 Personal history of nicotine dependence: Secondary | ICD-10-CM | POA: Insufficient documentation

## 2014-04-10 DIAGNOSIS — Z79899 Other long term (current) drug therapy: Secondary | ICD-10-CM | POA: Insufficient documentation

## 2014-04-10 DIAGNOSIS — S1093XA Contusion of unspecified part of neck, initial encounter: Secondary | ICD-10-CM

## 2014-04-10 DIAGNOSIS — Z8669 Personal history of other diseases of the nervous system and sense organs: Secondary | ICD-10-CM | POA: Insufficient documentation

## 2014-04-10 DIAGNOSIS — Z7982 Long term (current) use of aspirin: Secondary | ICD-10-CM | POA: Insufficient documentation

## 2014-04-10 DIAGNOSIS — Y9389 Activity, other specified: Secondary | ICD-10-CM | POA: Insufficient documentation

## 2014-04-10 DIAGNOSIS — R55 Syncope and collapse: Secondary | ICD-10-CM | POA: Insufficient documentation

## 2014-04-10 DIAGNOSIS — S0003XA Contusion of scalp, initial encounter: Secondary | ICD-10-CM | POA: Insufficient documentation

## 2014-04-10 DIAGNOSIS — W1809XA Striking against other object with subsequent fall, initial encounter: Secondary | ICD-10-CM | POA: Insufficient documentation

## 2014-04-10 DIAGNOSIS — Z791 Long term (current) use of non-steroidal anti-inflammatories (NSAID): Secondary | ICD-10-CM | POA: Insufficient documentation

## 2014-04-10 LAB — BASIC METABOLIC PANEL
BUN: 22 mg/dL (ref 6–23)
CALCIUM: 9.9 mg/dL (ref 8.4–10.5)
CO2: 29 mEq/L (ref 19–32)
CREATININE: 0.84 mg/dL (ref 0.50–1.35)
Chloride: 101 mEq/L (ref 96–112)
GLUCOSE: 157 mg/dL — AB (ref 70–99)
POTASSIUM: 3.5 meq/L — AB (ref 3.7–5.3)
Sodium: 141 mEq/L (ref 137–147)

## 2014-04-10 LAB — CBC
HEMATOCRIT: 40.4 % (ref 39.0–52.0)
Hemoglobin: 14.3 g/dL (ref 13.0–17.0)
MCH: 32.4 pg (ref 26.0–34.0)
MCHC: 35.4 g/dL (ref 30.0–36.0)
MCV: 91.6 fL (ref 78.0–100.0)
Platelets: 202 10*3/uL (ref 150–400)
RBC: 4.41 MIL/uL (ref 4.22–5.81)
RDW: 13 % (ref 11.5–15.5)
WBC: 12 10*3/uL — ABNORMAL HIGH (ref 4.0–10.5)

## 2014-04-10 MED ORDER — HYDROCODONE-ACETAMINOPHEN 5-325 MG PO TABS: 2.0000 | ORAL_TABLET | ORAL | Status: DC | PRN

## 2014-04-10 MED ORDER — NAPROXEN 500 MG PO TABS: 500.0000 mg | ORAL_TABLET | Freq: Two times a day (BID) | ORAL | Status: DC

## 2014-04-10 MED ORDER — HYDROCODONE-ACETAMINOPHEN 5-325 MG PO TABS
2.0000 | ORAL_TABLET | Freq: Once | ORAL | Status: AC
  Administered 2014-04-10: 2 via ORAL
  Filled 2014-04-10: qty 2

## 2014-04-10 NOTE — ED Notes (Signed)
Pt states he was on the commode to have a bowel movement and wasn't able to do anything, states he felt hot all of a sudden and the next thing he knew he was on the floor.  Pt having pain to forehead, bridge of nose and right arm from shoulder down to wrist, states he vomited at the house.

## 2014-04-10 NOTE — Discharge Instructions (Signed)
Please call your doctor for a followup appointment within 24-48 hours. When you talk to your doctor please let them know that you were seen in the emergency department and have them acquire all of your records so that they can discuss the findings with you and formulate a treatment plan to fully care for your new and ongoing problems. ° °

## 2014-04-10 NOTE — ED Notes (Signed)
Pt alert & oriented x4, stable gait. Patient given discharge instructions, paperwork & prescription(s). Patient  instructed to stop at the registration desk to finish any additional paperwork. Patient verbalized understanding. Pt left department w/ no further questions. 

## 2014-04-10 NOTE — ED Provider Notes (Signed)
CSN: [id number]     Arrival date & time 04/10/14  0123 History   First MD Initiated Contact with Patient 04/10/14 0132     Chief Complaint  Patient presents with  . syncopal episode      (Consider location/radiation/quality/duration/timing/severity/associated sxs/prior Treatment) HPI Comments: 63 year old male with a history of Parkinson's disease and hypertension who presents to the hospital with a complaint of syncope. He states that he was sitting on the toilet having a bowel movement when he became acutely lightheaded and warm in the face and then passed out falling forward and striking his head on the floor. When the patient awoke he was extremely diaphoretic, nauseated and vomited one time. His wife found on the floor, called 911. When the paramedics arrived they found the patient awake, alert, diaphoretic with a pulse of 60 and normal blood pressure. Since that time he has been awake, alert and appropriate and other than complaining of a headache from this fall he has no other complaints. He had been feeling fine earlier in the day without any problems with nausea vomiting fevers chills coughing shortness of breath back pain neck pain weakness numbness or any other complaints and had a normal day of rest and eating normal meals.  The history is provided by the patient and the EMS personnel.    Past Medical History  Diagnosis Date  . Parkinson disease   . Hypertension   . Dysrhythmia   . Heart murmur   . Kidney stones   . Parkinson's disease    Past Surgical History  Procedure Laterality Date  . 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 Coronary stent placement  07/11/12  . Cardiac catheterization  07/11/2012    high graded stenoses small ift circ and small 1st diag   which were  left for medicl therapy, lv systoilc fx preserved  . Coronary angioplasty  07/11/2012    90% and 70% lesions RCA  3.0 x32 mm Promus DES   Family History  Problem Relation Age of Onset  . Adopted: Yes   History  Substance  Use Topics  . Smoking status: Former Smoker -- 1.00 packs/day for 20 years    Types: Cigarettes    Quit date: 11/10/1996  . Smokeless tobacco: Never Used  . Alcohol Use: No    Review of Systems  All other systems reviewed and are negative.     Allergies  Codeine  Home Medications   Prior to Admission medications   Medication Sig Start Date End Date Taking? Authorizing Provider  aspirin 81 MG tablet Take 81 mg by mouth daily.     Yes Historical Provider, MD  atorvastatin (LIPITOR) 80 MG tablet Take 1 tablet (80 mg total) by mouth daily at 6 PM. 10/26/13 10/26/14 Yes Zigmund Gottron, MD  Calcium-Vitamin D 600-125 MG-UNIT TABS 1 by mouth daily    Yes Historical Provider, MD  carbidopa-levodopa (SINEMET IR) 25-100 MG per tablet Take 2 tablets by mouth 3 (three) times daily. 05/20/13  Yes Zigmund Gottron, MD  hydrochlorothiazide (MICROZIDE) 12.5 MG capsule TAKE 1 CAPSULE BY MOUTH EVERY MORNING. 11/02/13  Yes Mihai Croitoru, MD  lisinopril (PRINIVIL,ZESTRIL) 20 MG tablet Take 1 tablet (20 mg total) by mouth daily. 10/14/13 10/14/14 Yes Mihai Croitoru, MD  Multiple Vitamin (MULTIVITAMIN) tablet Take 1 tablet by mouth daily.     Yes Historical Provider, MD  HYDROcodone-acetaminophen (NORCO/VICODIN) 5-325 MG per tablet Take 2 tablets by mouth every 4 (four) hours as needed. 04/10/14   Aaron Edelman  Amparo Bristol, MD  naproxen (NAPROSYN) 500 MG tablet Take 1 tablet (500 mg total) by mouth 2 (two) times daily with a meal. 04/10/14   Johnna Acosta, MD  nitroGLYCERIN (NITROLINGUAL) 0.4 MG/SPRAY spray  07/26/12   Historical Provider, MD  zoster vaccine live, PF, (ZOSTAVAX) 02585 UNT/0.65ML injection Inject 19,400 Units into the skin once. 10/26/13   Zigmund Gottron, MD   BP 117/63  Pulse 63  Temp(Src) 98.1 F (36.7 C) (Oral)  Resp 20  SpO2 96% Physical Exam  Nursing note and vitals reviewed. Constitutional: He appears well-developed and well-nourished. No distress.  HENT:  Head:  Normocephalic.  Mouth/Throat: Oropharynx is clear and moist. No oropharyngeal exudate.  Frontal hematoma injerior to the hairline on the L.  No hemotympanum, malocclusion or other signs of facial injury.  Nasal septum without hematoma - no ttp over the nasal bones, dried blood in bialteral nares.  Eyes: Conjunctivae and EOM are normal. Pupils are equal, round, and reactive to light. Right eye exhibits no discharge. Left eye exhibits no discharge. No scleral icterus.  Neck: Normal range of motion. Neck supple. No JVD present. No thyromegaly present.  Cardiovascular: Normal rate, regular rhythm, normal heart sounds and intact distal pulses.  Exam reveals no gallop and no friction rub.   No murmur heard. Pulmonary/Chest: Effort normal and breath sounds normal. No respiratory distress. He has no wheezes. He has no rales.  Abdominal: Soft. Bowel sounds are normal. He exhibits no distension and no mass. There is no tenderness.  Musculoskeletal: Normal range of motion. He exhibits no edema and no tenderness.  Normal range of motion of upper and lower extremities including the preserved ability to straight leg raise. No tenderness over the cervical thoracic or lumbar spines.  Lymphadenopathy:    He has no cervical adenopathy.  Neurological: He is alert. Coordination normal.  Tremor of the right upper extremity  Skin: Skin is warm and dry. No rash noted. No erythema.  Small skin tear to the left upper extremity mid biceps lateral  Psychiatric: He has a normal mood and affect. His behavior is normal.    ED Course  Procedures (including critical care time) Labs Review Labs Reviewed  CBC - Abnormal; Notable for the following:    WBC 12.0 (*)    All other components within normal limits  BASIC METABOLIC PANEL - Abnormal; Notable for the following:    Potassium 3.5 (*)    Glucose, Bld 157 (*)    All other components within normal limits    Imaging Review Ct Head Wo Contrast  04/10/2014   CLINICAL  DATA:  Severe headache after fall. Forehead trauma of and emesis.  EXAM: CT HEAD WITHOUT CONTRAST  CT CERVICAL SPINE WITHOUT CONTRAST  TECHNIQUE: Multidetector CT imaging of the head and cervical spine was performed following the standard protocol without intravenous contrast. Multiplanar CT image reconstructions of the cervical spine were also generated.  COMPARISON:  None.  FINDINGS: CT HEAD FINDINGS  Skull and Sinuses:There is a partially imaged lobulated density in the inferior left maxillary antrum, usually representing mucous retention cyst. Left forehead contusion. No fracture or destructive process.  Orbits: No acute abnormality.  Brain: No evidence of acute abnormality, such as acute infarction, hemorrhage, hydrocephalus, or mass lesion/mass effect.  CT CERVICAL SPINE FINDINGS  No evidence of acute fracture or subluxation. A sagittally oriented lucency through the C6 spinous process is considered chronic given its incomplete and smooth appearance. There is spondylotic changes with disc narrowing and  spurring most progressed at C6-7. There is facet osteoarthritis, with degenerative sclerosis and spurring most advanced on the right at C7-T1. No evidence of significant osseous canal or foraminal stenosis.  IMPRESSION: Negative for acute intracranial or cervical spine injury.   Electronically Signed   By: Jorje Guild M.D.   On: 04/10/2014 03:06   Ct Cervical Spine Wo Contrast  04/10/2014   CLINICAL DATA:  Severe headache after fall. Forehead trauma of and emesis.  EXAM: CT HEAD WITHOUT CONTRAST  CT CERVICAL SPINE WITHOUT CONTRAST  TECHNIQUE: Multidetector CT imaging of the head and cervical spine was performed following the standard protocol without intravenous contrast. Multiplanar CT image reconstructions of the cervical spine were also generated.  COMPARISON:  None.  FINDINGS: CT HEAD FINDINGS  Skull and Sinuses:There is a partially imaged lobulated density in the inferior left maxillary antrum,  usually representing mucous retention cyst. Left forehead contusion. No fracture or destructive process.  Orbits: No acute abnormality.  Brain: No evidence of acute abnormality, such as acute infarction, hemorrhage, hydrocephalus, or mass lesion/mass effect.  CT CERVICAL SPINE FINDINGS  No evidence of acute fracture or subluxation. A sagittally oriented lucency through the C6 spinous process is considered chronic given its incomplete and smooth appearance. There is spondylotic changes with disc narrowing and spurring most progressed at C6-7. There is facet osteoarthritis, with degenerative sclerosis and spurring most advanced on the right at C7-T1. No evidence of significant osseous canal or foraminal stenosis.  IMPRESSION: Negative for acute intracranial or cervical spine injury.   Electronically Signed   By: Jorje Guild M.D.   On: 04/10/2014 03:06     EKG Interpretation   Date/Time:  Monday April 10 2014 01:45:31 EDT Ventricular Rate:  64 PR Interval:  186 QRS Duration: 98 QT Interval:  407 QTC Calculation: 420 R Axis:   30 Text Interpretation:  Normal sinus rhythm Normal ECG since last tracing no  significant change Confirmed by Donterius Filley  MD, Zariyah Stephens ([id number]) on 04/10/2014  3:33:23 AM      MDM   Final diagnoses:  Syncope  Contusion of face  Minor head injury    The patient has had an injury to his head, hematoma present, no cervical spine pain and no focal neurologic deficits. It appears as though he had a syncopal episode which was likely vasovagal in origin, his vital signs are normal at this time, we'll check CBG, EKG, CT scan of the head and basic wants to rule out anemia or left leg abnormalities.  The patient has normal laboratory values other than a mild leukocytosis and a CT scan which shows no signs of intracranial injury or spinal fracture. The patient has been asymptomatic and appears stable for discharge. I've discussed the findings with the patient who is in agreement with  the plan.   Meds given in ED:  Medications  HYDROcodone-acetaminophen (NORCO/VICODIN) 5-325 MG per tablet 2 tablet (not administered)    New Prescriptions   HYDROCODONE-ACETAMINOPHEN (NORCO/VICODIN) 5-325 MG PER TABLET    Take 2 tablets by mouth every 4 (four) hours as needed.   NAPROXEN (NAPROSYN) 500 MG TABLET    Take 1 tablet (500 mg total) by mouth 2 (two) times daily with a meal.      Johnna Acosta, MD 04/10/14 [phone]

## 2014-04-11 ENCOUNTER — Telehealth: Payer: Self-pay | Admitting: Family Medicine

## 2014-04-11 NOTE — Telephone Encounter (Signed)
Discussed.  Was in ER for noctural micturition syncope.  Parkinsons puts him at high risk for autonomic dysfunction.  We may be overtreating hypertension - he has lost weight and is eating healthier.  Will stop the HCTZ, they will check several home BPs and call me in a week or two to consider further adjustments.

## 2014-04-11 NOTE — Telephone Encounter (Signed)
Vickie called to inform Dr. Andria Frames that Christopher Weeks was in the ER on 6/1 and they ran a lot of tests. She wanted you to look at the results and see what you think. If you have any questions please call her. jw

## 2014-04-21 ENCOUNTER — Encounter: Payer: Self-pay | Admitting: Family Medicine

## 2014-05-24 ENCOUNTER — Encounter: Payer: Self-pay | Admitting: Family Medicine

## 2014-05-24 ENCOUNTER — Ambulatory Visit (INDEPENDENT_AMBULATORY_CARE_PROVIDER_SITE_OTHER): Payer: BC Managed Care – PPO | Admitting: Family Medicine

## 2014-05-24 VITALS — BP 127/68 | HR 49 | Temp 99.0°F | Ht 72.0 in | Wt 205.0 lb

## 2014-05-24 DIAGNOSIS — R7309 Other abnormal glucose: Secondary | ICD-10-CM

## 2014-05-24 DIAGNOSIS — I10 Essential (primary) hypertension: Secondary | ICD-10-CM

## 2014-05-24 DIAGNOSIS — E785 Hyperlipidemia, unspecified: Secondary | ICD-10-CM

## 2014-05-24 DIAGNOSIS — R7303 Prediabetes: Secondary | ICD-10-CM

## 2014-05-24 DIAGNOSIS — G20 Parkinson's disease: Secondary | ICD-10-CM

## 2014-05-24 DIAGNOSIS — I2510 Atherosclerotic heart disease of native coronary artery without angina pectoris: Secondary | ICD-10-CM

## 2014-05-24 DIAGNOSIS — G20A1 Parkinson's disease without dyskinesia, without mention of fluctuations: Secondary | ICD-10-CM

## 2014-05-24 DIAGNOSIS — Z1211 Encounter for screening for malignant neoplasm of colon: Secondary | ICD-10-CM

## 2014-05-24 MED ORDER — ZOSTER VACCINE LIVE 19400 UNT/0.65ML ~~LOC~~ SOLR: 0.6500 mL | Freq: Once | SUBCUTANEOUS | Status: DC

## 2014-05-24 NOTE — Assessment & Plan Note (Signed)
Will check fasting glucose as part of CMP.  Also Hgb A1C.  I am encourage by nice weight loss

## 2014-05-24 NOTE — Assessment & Plan Note (Signed)
Should have cards FU despite the fact that he is symptomatically great.

## 2014-05-24 NOTE — Assessment & Plan Note (Signed)
Good control on less meds.

## 2014-05-24 NOTE — Assessment & Plan Note (Signed)
Due for screening colonoscopy soon.  Refer to GI

## 2014-05-24 NOTE — Assessment & Plan Note (Addendum)
Generally well controled except tremor.  He should check back with neuro.

## 2014-05-24 NOTE — Progress Notes (Signed)
   Subjective:    Patient ID: Christopher Weeks, male    DOB: 01-12-1951, 63 y.o.   MRN: [id number]  HPI  FU multiple problems. 1. Episode of syncope thought to be a combo of vasovagal and over treatment of hypertension.  No further episodes.  Denies lightheaded spells.  BP running fine. 2. S/P cardiac stenting.  Has not seen cards - due for his annual exam soon.  No chest pain complaints.  Eating healthy, more active and slowly losing weight. 3. Parkinsons Disease.  Still with bothersome pill rolling tremor.  Has not seen neuro in over a year. 4. HPDP needs zostavx Coming due for colonoscopy soon.    Review of Systems     Objective:   Physical Exam Classic pill rolling tremor of right hand Good facial expression, no masked faces.   Lungs clear Cardiac RRR without m or g        Assessment & Plan:

## 2014-05-24 NOTE — Assessment & Plan Note (Signed)
Check fasting labs 

## 2014-05-24 NOTE — Patient Instructions (Addendum)
I sent in the Rx for the shingles vaccine.  Let me know if you get it. You are due for a cholesterol check any time after Sept 19.  Call the day before you want to come in. You are due for a repeat colonoscopy any time after 06/10/2014. You should see the heart doctor and the neurologist about once a year. The weight is great.  Strong work.

## 2014-05-25 ENCOUNTER — Encounter: Payer: Self-pay | Admitting: Internal Medicine

## 2014-07-18 ENCOUNTER — Encounter: Payer: BC Managed Care – PPO | Admitting: Internal Medicine

## 2014-07-25 ENCOUNTER — Other Ambulatory Visit: Payer: Self-pay | Admitting: *Deleted

## 2014-07-25 DIAGNOSIS — G20 Parkinson's disease: Secondary | ICD-10-CM

## 2014-07-25 MED ORDER — CARBIDOPA-LEVODOPA 25-100 MG PO TABS: 2.0000 | ORAL_TABLET | Freq: Three times a day (TID) | ORAL | Status: DC

## 2014-08-17 ENCOUNTER — Other Ambulatory Visit: Payer: Self-pay | Admitting: *Deleted

## 2014-08-17 MED ORDER — LISINOPRIL 20 MG PO TABS: 20.0000 mg | ORAL_TABLET | Freq: Every day | ORAL | Status: DC

## 2014-08-17 NOTE — Telephone Encounter (Signed)
Medication refilled electronically including a notation:Pt needs to schedule an appointment for future refills

## 2014-08-25 ENCOUNTER — Other Ambulatory Visit: Payer: Self-pay

## 2014-08-27 ENCOUNTER — Encounter: Payer: Self-pay | Admitting: Family Medicine

## 2014-08-28 ENCOUNTER — Telehealth: Payer: Self-pay | Admitting: *Deleted

## 2014-08-28 NOTE — Telephone Encounter (Signed)
Updated health maint tab

## 2014-08-28 NOTE — Telephone Encounter (Signed)
Documented in immunization history.  

## 2014-10-19 ENCOUNTER — Encounter (HOSPITAL_COMMUNITY): Payer: Self-pay | Admitting: Cardiovascular Disease

## 2014-11-06 ENCOUNTER — Other Ambulatory Visit: Payer: Self-pay | Admitting: Family Medicine

## 2014-11-06 ENCOUNTER — Other Ambulatory Visit: Payer: Self-pay

## 2014-11-06 MED ORDER — LISINOPRIL 20 MG PO TABS: 20.0000 mg | ORAL_TABLET | Freq: Every day | ORAL | Status: DC

## 2014-11-06 NOTE — Telephone Encounter (Signed)
Rx sent to pharmacy   

## 2014-11-15 ENCOUNTER — Other Ambulatory Visit: Payer: Self-pay | Admitting: *Deleted

## 2014-11-15 ENCOUNTER — Telehealth: Payer: Self-pay | Admitting: Family Medicine

## 2014-11-15 NOTE — Telephone Encounter (Signed)
Wife called because they are in the middle of changing insurance. They have made an appointment for 12/27/14 to come in. He will be out of his Lisinopril before then and they need to wait until the new insurance takes effect. Can we call in 30 days worth of his Lisinopril to get them through until the appointment and new insurance kicks in. [place]

## 2014-11-16 ENCOUNTER — Other Ambulatory Visit: Payer: Self-pay

## 2014-11-16 MED ORDER — LISINOPRIL 20 MG PO TABS: 20.0000 mg | ORAL_TABLET | Freq: Every day | ORAL | Status: DC

## 2014-12-27 ENCOUNTER — Ambulatory Visit (INDEPENDENT_AMBULATORY_CARE_PROVIDER_SITE_OTHER): Payer: PPO | Admitting: Family Medicine

## 2014-12-27 ENCOUNTER — Encounter: Payer: Self-pay | Admitting: Family Medicine

## 2014-12-27 DIAGNOSIS — I2510 Atherosclerotic heart disease of native coronary artery without angina pectoris: Secondary | ICD-10-CM | POA: Diagnosis not present

## 2014-12-27 DIAGNOSIS — I10 Essential (primary) hypertension: Secondary | ICD-10-CM | POA: Diagnosis not present

## 2014-12-27 LAB — LIPID PANEL
CHOL/HDL RATIO: 4.2 ratio
CHOLESTEROL: 154 mg/dL (ref 0–200)
HDL: 37 mg/dL — ABNORMAL LOW (ref >39)
LDL Cholesterol: 86 mg/dL (ref 0–99)
TRIGLYCERIDES: 154 mg/dL — AB (ref <150)
VLDL: 31 mg/dL (ref 0–40)

## 2014-12-27 LAB — BASIC METABOLIC PANEL
BUN: 18 mg/dL (ref 6–23)
CO2: 26 mEq/L (ref 19–32)
Calcium: 10.6 mg/dL — ABNORMAL HIGH (ref 8.4–10.5)
Chloride: 103 mEq/L (ref 96–112)
Creat: 0.86 mg/dL (ref 0.50–1.35)
GLUCOSE: 99 mg/dL (ref 70–99)
POTASSIUM: 4.3 meq/L (ref 3.5–5.3)
Sodium: 140 mEq/L (ref 135–145)

## 2014-12-27 MED ORDER — LISINOPRIL-HYDROCHLOROTHIAZIDE 20-12.5 MG PO TABS: 1.0000 | ORAL_TABLET | Freq: Every day | ORAL | Status: DC

## 2014-12-27 MED ORDER — ATORVASTATIN CALCIUM 80 MG PO TABS: ORAL_TABLET | ORAL | Status: DC

## 2014-12-27 NOTE — Progress Notes (Signed)
   Subjective:    Patient ID: Christopher Weeks, male    DOB: Mar 14, 1951, 64 y.o.   MRN: [id number]  HPI FU multiple problems 1. HBP not well controled today.  No symptoms  No hx of gout 2. Hx of CAD.  On aspirin and atorvastatin.  Due for lipid check.  No chest pain. 3. Parkinsons.  Right hand tremor worse.  He now admits to memory problems.   4. Memory problems and other health worries have left him depressed.  His marriage is suffering because they argue more and he recognizes that he is more irritable. 5. Insomnia.  Has not tried OTC meds.  Depression and tremor are both playing a role. 6. Due for colonoscopy.  Otherwise up to date on HPDP.  He had scheduled and needed to cancel.  He will reschedule.    Review of Systems   No SOB, leg swelling, bleeding, skin, appetite or stool changes     Objective:   Physical Exam Gen: teary in office when talking about wife and memory loss. HBP confirmed Lungs clear Cardiac RRR without m or g Abd benign Ext no edema.        Assessment & Plan:

## 2014-12-27 NOTE — Patient Instructions (Signed)
I changed your blood pressure medicine - now it contains lisinopril and a second medication. I refilled your cholesterol medication.   I will call with blood test results. See me in one month for a recheck of blood pressure. In one month, we will talk about adding a depression medication. Get your colonoscopy done. For an old man, you are doing OK.   I want you to try over the counter benadryl (diphenhydramine) for sleep.  25 to 50 mg at night.  It may also help the tremor.

## 2014-12-28 ENCOUNTER — Encounter: Payer: Self-pay | Admitting: Family Medicine

## 2015-01-15 ENCOUNTER — Encounter: Payer: Self-pay | Admitting: Family Medicine

## 2015-01-15 ENCOUNTER — Telehealth: Payer: Self-pay | Admitting: Home Health Services

## 2015-01-15 ENCOUNTER — Telehealth: Payer: Self-pay | Admitting: Family Medicine

## 2015-01-15 NOTE — Telephone Encounter (Signed)
Wife called because her husband had a spell on Saturday at 3 am. He passed out and was only out for a minute and she did ask him if she needed to call 911. He said he was fine other than his right hand twitching. She massaged his right hand until the twitching stopped. She just wanted to make the doctor aware of what happened. jw

## 2015-01-15 NOTE — Telephone Encounter (Signed)
Had syncope episode 04/11/14.  Had another yesterday 3AM in the morning as he finished peeing.  I wonder if I am overtreating BP.  Would like to get 24 hour BP monitor.

## 2015-01-15 NOTE — Telephone Encounter (Signed)
Left VM to call back.  Needs to schedule AWV with Vinnie Level.

## 2015-01-16 ENCOUNTER — Ambulatory Visit (INDEPENDENT_AMBULATORY_CARE_PROVIDER_SITE_OTHER): Payer: PPO | Admitting: Pharmacist

## 2015-01-16 ENCOUNTER — Ambulatory Visit (INDEPENDENT_AMBULATORY_CARE_PROVIDER_SITE_OTHER): Payer: PPO | Admitting: Home Health Services

## 2015-01-16 ENCOUNTER — Encounter: Payer: Self-pay | Admitting: Home Health Services

## 2015-01-16 VITALS — BP 128/71 | HR 55 | Temp 97.7°F | Ht 72.0 in | Wt 204.0 lb

## 2015-01-16 DIAGNOSIS — I10 Essential (primary) hypertension: Secondary | ICD-10-CM

## 2015-01-16 DIAGNOSIS — Z0000 Encounter for general adult medical examination without abnormal findings: Secondary | ICD-10-CM

## 2015-01-16 NOTE — Telephone Encounter (Signed)
-----   Message from [name] to Zenia Resides, MD sent at 01/15/2015 11:18 AM -----     Christopher Weeks has been feeling a little "slow" for the last week to 10 days. On Saturday 01/13/15, we had an easy day and evening, Christopher Weeks did go out for a short while to [place] with our son in law, but otherwise, an uneventful day. We went to bed around 10:30 that evening and around     3 am he woke me up calling my name. He was in the bathroom, on the toilet, and was really hot & sweating and then cold with chills. At that point, he passed out and hit the floor. Not hard like last time, but last time he was in a standing position, this time he was sitting on the toilet, and since i was there, i sort of "broke his fall even more, so he did not hit hard. He was only out for a second, not long at all. I kept asking him if he wanted me to call 911. He said no, just to help him up, Once I got him up, he seemed OK. Not hot, not cold. He was talking, wanted something to drink.  We have an appt. tomorrow, 01/16/15, to see Manuela Schwartz at 10am if you think you need to see him before 3/16. thanks vickie Gemmer

## 2015-01-16 NOTE — Progress Notes (Signed)
Patient here for annual wellness visit, patient reports: Risk Factors/Conditions needing evaluation or treatment: Pt. Does not have have any new risk factors that need evaluation.  Home Safety: Pt lives at home, with wife. Pt reports having smoke alarms and does not have adaptive equipment.  Other Information: Corrective lens: Pt wears reading corrective lens, has irregular eye exams. Dentures: Pt does not have dentures, has irregular dental exams. Memory: Pt denies memory problems.  Patient's Mini Mental Score (recorded in doc. flowsheet): 28 BMI/Exercise: We discussed BMI and strategies for weight loss including portion sizes and healthy eating.  We also discussed  maintaining a regular exercise routine.  Med Adherence:  We discussed importance of taking medications for BP.   ADL/IADL:  Pt reports independence in all functions. Balance/Gait: Pt reports 1 falls in the past year.  We discussed home safety and fall prevention.   Hearing:  Pt reports some problems with hearing.     Annual Wellness Visit Requirements Recorded Today In  Medical, family, social history Past Medical, Family, Social History Section  Current providers Care team  Current medications Medications  Wt, BP, Ht, BMI Vital signs  Hearing assessment (welcome visit) Progress Notes   Tobacco, alcohol, illicit drug use History  ADL Nurse Assessment  Depression Screening Nurse Assessment  Cognitive impairment Document Flowsheet  Mini Mental Status Document Flowsheet  Fall Risk Fall/Depression  Home Safety Progress Note  End of Life Planning (welcome visit) Social Documentation  Medicare preventative services Health Maintenance  Risk factors/conditions needing evaluation/treatment Progress Note  Personalized health advice Patient Instructions, goals, letter  Diet & Exercise Social Documentation  Emergency Contact Social Documentation  Seat Belts Social Documentation  Sun exposure/protection Social Documentation

## 2015-01-16 NOTE — Progress Notes (Signed)
Patient ID: Christopher Weeks, male   DOB: 08-15-1951, 64 y.o.   MRN: [id number] I have reviewed this visit and discussed with Lamont Dowdy and agree with her documentation

## 2015-01-16 NOTE — Patient Instructions (Addendum)
Blood pressure test showed that your readings were LOW and we will decrease your blood pressure medications.   We determined that you likely have "White-Coat hypertension"  Please STOP the lisinopril/HCTZ combination Blood Pressure medication.   Please pick up and start the lisinopril at 5mg  once daily.  Please bring back any reading you get with your home monitor at your next visit. Follow up with Dr. Andria Frames on 3/16

## 2015-01-17 MED ORDER — LISINOPRIL 5 MG PO TABS: 5.0000 mg | ORAL_TABLET | Freq: Every day | ORAL | Status: DC

## 2015-01-18 ENCOUNTER — Encounter: Payer: Self-pay | Admitting: Pharmacist

## 2015-01-18 NOTE — Assessment & Plan Note (Signed)
History of hypertension since 2010 found to have white coat hypertension (BP at goal).  Given 24-hour ambulatory blood pressure demonstrates BP at goal and lower than normal at times with an average blood pressure of 113/63 mmHg (93/52 during sleep period), and a nocturnal dipping pattern that is normal. Patient discussed and reviewed with primary care physician Dr. Andria Frames.  Changes to medications include stopping hctz (only recently added 2 weeks ago) and reducing lisinopril to 5 mg daily due to low BPs throughout the day and recent syncopal episodes. Results reviewed and written information provided.  Patient instructed to bring home BP readings to follow up clinic visit.  F/U Clinic Visit with Dr. Andria Frames on 3/16.

## 2015-01-18 NOTE — Progress Notes (Signed)
S:    Patient arrives in good spirits accompanied by wife on day 1. Presents to the clinic for ambulatory blood pressure evaluation.  Diagnosed with Hypertension in the year of 2010. Reports multiple episodes of syncope over the weekend.  No issues reported with medication compliance.  Discussed procedure for wearing the monitor and gave patient written instructions. Monitor was placed on non-dominant arm with instructions to return in the morning.   Current BP Medications include:  Lisinopril-hctz 20mg  -12.5mg  (hctz recently added ~2 weeks ago)  Antihypertensives tried in the past include: metoprolol tartrate  Patient returned to the clinic by himself and reported no issues with BP monitor or further syncope.  O:   Last 3 Office BP readings: BP Readings from Last 3 Encounters:  01/16/15 122/76  01/16/15 128/71  12/27/14 162/100     Today's Office BP reading: 122/76 mmHg (manual reading)  ABPM Study Data: Arm Placement left arm   Overall Mean 24hr BP:   113/63 mmHg HR: 54   Daytime Mean BP:  120/68 mmHg HR: 56   Nighttime Mean BP:  93/52 mmHg HR: 49   Dipping Pattern: Yes.    Sys:   23%   Dia: 23.4%   [normal dipping ~10-20%]   Non-hypertensive ABPM thresholds: daytime BP <135/85 mmHg, sleeptime BP <120/70 mmHg NICE Hypertension Guidelines ([place]) using ABPM: Stage I: >135/85 mmHg, Stage 2: >150/95 mmHg)   BMET    Component Value Date/Time   NA 140 12/27/2014 1412   K 4.3 12/27/2014 1412   CL 103 12/27/2014 1412   CO2 26 12/27/2014 1412   GLUCOSE 99 12/27/2014 1412   BUN 18 12/27/2014 1412   CREATININE 0.86 12/27/2014 1412   CREATININE 0.84 04/10/2014 0144   CALCIUM 10.6* 12/27/2014 1412   GFRNONAA >90 04/10/2014 0144   GFRNONAA 87 12/19/2011 1118   GFRAA >90 04/10/2014 0144   GFRAA >89 12/19/2011 1118    A/P: History of hypertension since 2010 found to have white coat hypertension (BP at goal).  Given 24-hour ambulatory blood pressure demonstrates BP at goal  and lower than normal at times with an average blood pressure of 113/63 mmHg (93/52 during sleep period), and a nocturnal dipping pattern that is normal. Patient discussed and reviewed with primary care physician Dr. Andria Frames.  Changes to medications include stopping hctz (only recently added 2 weeks ago) and reducing lisinopril to 5 mg daily due to low BPs throughout the day and recent syncopal episodes. Results reviewed and written information provided.  Patient instructed to bring home BP readings to follow up clinic visit.  F/U Clinic Visit with Dr. Andria Frames on 3/16.  Total time in face-to-face counseling 30 minutes.  Patient seen with Harland German, PharmD Candidate, Elicia Lamp,  PharmD Resident, and Nicoletta Ba, PharmD resident.

## 2015-01-18 NOTE — Progress Notes (Signed)
Patient ID: Christopher Weeks, male   DOB: 15-Feb-1951, 64 y.o.   MRN: [id number] Reviewed: Agree with Dr. Graylin Shiver documentation and management.

## 2015-01-24 ENCOUNTER — Encounter: Payer: Self-pay | Admitting: Family Medicine

## 2015-01-24 ENCOUNTER — Ambulatory Visit (INDEPENDENT_AMBULATORY_CARE_PROVIDER_SITE_OTHER): Payer: PPO | Admitting: Family Medicine

## 2015-01-24 VITALS — BP 156/76 | HR 78 | Ht 72.0 in | Wt 209.0 lb

## 2015-01-24 DIAGNOSIS — K219 Gastro-esophageal reflux disease without esophagitis: Secondary | ICD-10-CM | POA: Diagnosis not present

## 2015-01-24 DIAGNOSIS — I10 Essential (primary) hypertension: Secondary | ICD-10-CM

## 2015-01-24 DIAGNOSIS — I2510 Atherosclerotic heart disease of native coronary artery without angina pectoris: Secondary | ICD-10-CM

## 2015-01-24 DIAGNOSIS — G20 Parkinson's disease: Secondary | ICD-10-CM | POA: Diagnosis not present

## 2015-01-24 DIAGNOSIS — Z114 Encounter for screening for human immunodeficiency virus [HIV]: Secondary | ICD-10-CM | POA: Diagnosis not present

## 2015-01-24 HISTORY — DX: Gastro-esophageal reflux disease without esophagitis: K21.9

## 2015-01-24 MED ORDER — PROPRANOLOL HCL 10 MG PO TABS: 10.0000 mg | ORAL_TABLET | Freq: Two times a day (BID) | ORAL | Status: DC

## 2015-01-24 NOTE — Assessment & Plan Note (Addendum)
Still with right hand tremor Will try low dose beta blocker nonspecific for both tremor of for 2nd CAD prevention.

## 2015-01-24 NOTE — Assessment & Plan Note (Signed)
Recommend OTC xantac

## 2015-01-24 NOTE — Patient Instructions (Addendum)
I am glad you are not having any passing out. Try over the counter xantac or pepcid for the heartburn.  Take at night before bed. You, too, need a colonoscopy.   Otherwise things are going well Tell me if the new medicine helps with the tremor. If it does not help, I will refer you back to the neurologist.

## 2015-01-24 NOTE — Assessment & Plan Note (Signed)
screen 

## 2015-01-25 LAB — HIV ANTIBODY (ROUTINE TESTING W REFLEX): HIV: NONREACTIVE

## 2015-01-25 NOTE — Assessment & Plan Note (Signed)
Will try low dose beta blocker nonspecific for both tremor of for 2nd CAD prevention. Asymptomatic

## 2015-01-25 NOTE — Progress Notes (Signed)
   Subjective:    Patient ID: Christopher Weeks, male    DOB: September 03, 1951, 64 y.o.   MRN: [id number]  HPI FU syncope, 24 hour BP monitoring.  Has both true hypertension and white coat hypertension.  I was overtreating based on 24 hour BP monitor.  No passing out since he cut back on BP meds. Continues to have symptomatic tremor of his right hand.  Diagnosed as parkinson's disease by neuro and on levodopa.  Oddly, he has no left hand tremor, gait disturbance or bradykinesia.  Asks if there is anything else I can do. Needs colonoscopy, last screened 2005. S?P stent for CAD.  Not on Beta blocker.    Review of Systems     Objective:   Physical Exam Lungs clear Cardiac RRR without m or g Abd benign Ext pill rolling tremor of right hand.  No tremor left.       Assessment & Plan:

## 2015-01-25 NOTE — Assessment & Plan Note (Signed)
Asymptomatic on current treatment without further syncope.

## 2015-02-26 ENCOUNTER — Encounter: Payer: Self-pay | Admitting: Family Medicine

## 2015-03-05 ENCOUNTER — Encounter: Payer: Self-pay | Admitting: Cardiovascular Disease

## 2015-03-05 ENCOUNTER — Ambulatory Visit (INDEPENDENT_AMBULATORY_CARE_PROVIDER_SITE_OTHER): Payer: PPO | Admitting: Cardiovascular Disease

## 2015-03-05 VITALS — BP 162/82 | HR 54 | Resp 16 | Ht 72.0 in | Wt 212.3 lb

## 2015-03-05 DIAGNOSIS — I10 Essential (primary) hypertension: Secondary | ICD-10-CM | POA: Diagnosis not present

## 2015-03-05 DIAGNOSIS — I2510 Atherosclerotic heart disease of native coronary artery without angina pectoris: Secondary | ICD-10-CM

## 2015-03-05 DIAGNOSIS — G20A1 Parkinson's disease without dyskinesia, without mention of fluctuations: Secondary | ICD-10-CM

## 2015-03-05 DIAGNOSIS — G20 Parkinson's disease: Secondary | ICD-10-CM | POA: Diagnosis not present

## 2015-03-05 MED ORDER — PROPRANOLOL HCL 10 MG PO TABS: 15.0000 mg | ORAL_TABLET | Freq: Two times a day (BID) | ORAL | Status: DC

## 2015-03-05 NOTE — Progress Notes (Signed)
Patient ID: Christopher Weeks, male   DOB: Mar 29, 1951, 64 y.o.   MRN: [id number]      Cardiology Office Note   Date:  03/07/2015   ID:  Christopher Weeks, DOB July 28, 1951, MRN [id number]  PCP:  Zigmund Gottron, MD  Cardiologist:   Sanda Klein, MD   Chief Complaint  Patient presents with  . Annual Exam    patient reports no complaints. patient's wife reports 1 episode of passing out in June 2015 r/t new blood pressure medicine. ~2 months ago he passed out again r/t change in blood pressure medicine.      History of Present Illness: Christopher Weeks is a 64 y.o. male who presents for CAD follow up  Q[place]a Skye had unstable angina treated with DES to the mid RCA in 2013 and is known to have other stenoses (60% D1, 60% proximal LCX and 60% distal RCA), but has no cardiac complaints.  His biggest problem is right hand tremor, reportedly related to Parkinson's. This improved remarkably after addition of propranolol in March by Dr. Andria Frames. He has had two brief syncope events: in June 2015 after a bowel movement and then a few weeks ago with micturition, both possibly related to use of a thiazide diuretic.  Past Medical History  Diagnosis Date  . Parkinson disease   . Hypertension   . Dysrhythmia   . Heart murmur   . Kidney stones   . Parkinson's disease     Past Surgical History  Procedure Laterality Date  . 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 Coronary stent placement  07/11/12  . Cardiac catheterization  07/11/2012    high graded stenoses small ift circ and small 1st diag   which were  left for medicl therapy, lv systoilc fx preserved  . Coronary angioplasty  07/11/2012    90% and 70% lesions RCA  3.0 x32 mm Promus DES  . Left heart cath N/A 07/11/2012    Procedure: LEFT HEART CATH;  Surgeon: Sanda Klein, MD;  Location: [facility] CATH LAB;  Service: Cardiovascular;  Laterality: N/A;  . Percutaneous coronary stent intervention (pci-s) Right 07/11/2012    Procedure: PERCUTANEOUS CORONARY STENT INTERVENTION (PCI-S);   Surgeon: Sanda Klein, MD;  Location: [facility] CATH LAB;  Service: Cardiovascular;  Laterality: Right;     Current Outpatient Prescriptions  Medication Sig Dispense Refill  . aspirin 81 MG tablet Take 81 mg by mouth daily.      Marland Kitchen atorvastatin (LIPITOR) 80 MG tablet TAKE (1) TABLET BY MOUTH AT BEDTIME. 90 tablet 3  . Calcium-Vitamin D 600-125 MG-UNIT TABS 1 by mouth daily     . carbidopa-levodopa (SINEMET IR) 25-100 MG per tablet Take 2 tablets by mouth 3 (three) times daily. 540 tablet 3  . diphenhydrAMINE (BENADRYL) 25 mg capsule Take 25 mg by mouth daily as needed for allergies.    . Melatonin 1 MG TABS Take 1 tablet by mouth at bedtime as needed (sleep).    . Multiple Vitamin (MULTIVITAMIN) tablet Take 1 tablet by mouth daily.      . nitroGLYCERIN (NITROLINGUAL) 0.4 MG/SPRAY spray     . propranolol (INDERAL) 10 MG tablet Take 1.5 tablets (15 mg total) by mouth 2 (two) times daily. For heart and tremor 90 tablet 11  . ranitidine (ZANTAC) 150 MG capsule Take 150 mg by mouth 2 (two) times daily.     No current facility-administered medications for this visit.    Allergies:   Codeine    Social History:  The patient  reports that he quit smoking about 18 years ago. His smoking use included Cigarettes. He has a 20 pack-year smoking history. He has never used smokeless tobacco. He reports that he uses illicit drugs (Marijuana). He reports that he does not drink alcohol.   Family History:  The patient's family history is not known. He was adopted.    ROS:  Please see the history of present illness.    Otherwise, review of systems positive for none.   All other systems are reviewed and negative.    PHYSICAL EXAM: VS:  BP 162/82 mmHg  Pulse 54  Resp 16  Ht 6' (1.829 m)  Wt 212 lb 4.8 oz (96.299 kg)  BMI 28.79 kg/m2 , BMI Body mass index is 28.79 kg/(m^2).  General: Alert, oriented x3, no distress Head: no evidence of trauma, PERRL, EOMI, no exophtalmos or lid lag, no myxedema, no  xanthelasma; normal ears, nose and oropharynx Neck: normal jugular venous pulsations and no hepatojugular reflux; brisk carotid pulses without delay and no carotid bruits Chest: clear to auscultation, no signs of consolidation by percussion or palpation, normal fremitus, symmetrical and full respiratory excursions Cardiovascular: normal position and quality of the apical impulse, regular rhythm, normal first and second heart sounds, no murmurs, rubs or gallops Abdomen: no tenderness or distention, no masses by palpation, no abnormal pulsatility or arterial bruits, normal bowel sounds, no hepatosplenomegaly Extremities: no clubbing, cyanosis or edema; 2+ radial, ulnar and brachial pulses bilaterally; 2+ right femoral, posterior tibial and dorsalis pedis pulses; 2+ left femoral, posterior tibial and dorsalis pedis pulses; no subclavian or femoral bruits Neurological: resting tremor roght hand Psych: euthymic mood, full affect   EKG:  EKG is ordered today. The ekg ordered today demonstrates sinus bradycardia   Recent Labs: 04/10/2014: Hemoglobin 14.3; Platelets 202 12/27/2014: BUN 18; Creatinine 0.86; Potassium 4.3; Sodium 140    Lipid Panel    Component Value Date/Time   CHOL 154 12/27/2014 1412   TRIG 154* 12/27/2014 1412   HDL 37* 12/27/2014 1412   CHOLHDL 4.2 12/27/2014 1412   VLDL 31 12/27/2014 1412   LDLCALC 86 12/27/2014 1412      Wt Readings from Last 3 Encounters:  03/05/15 212 lb 4.8 oz (96.299 kg)  01/24/15 209 lb (94.802 kg)  01/16/15 204 lb (92.534 kg)      Other studies Reviewed: Additional studies/ records that were reviewed today include: notes from Dr. Andria Frames and ED  ASSESSMENT AND PLAN:  1.  CAD s/p remote DES-RCA - asymptomatic, focus is on risk factors. Fair lipid profile (ideally LDL<70, but acceptable)  2. Recurrent syncope - features of autonomic dysfunction, appears to be exacerbated by diuretics. Probably best to avoid vasodilators also.  3. HTN -  probably situational - 24 hour BP monitor showed lower ambulatory BP.  4. Tremor - the benefit of propranolol was clear in his opinion. Bradycardia and tendency to syncope may prevent use of higher doses, but the tremor is his major quality of life issue. Will gently push up the dose and stop the lisinopril   Current medicines are reviewed at length with the patient today.  The patient does not have concerns regarding medicines.  The following changes have been made:  Increase propranolol to 15 mg BID and stop lisinopril   Labs/ tests ordered today include:  Orders Placed This Encounter  Procedures  . EKG 12-Lead    Patient Instructions  Dr Sallyanne Kuster has recommended making the following medication changes: STOP LISINOPRIL INCREASE Propranolol to 1.5  tablets twice daily. A new prescription has been sent to your pharmacy electronically.  Dr Sallyanne Kuster recommends that you schedule a follow-up appointment in 1 year. You will receive a reminder letter in the mail two months in advance. If you don't receive a letter, please call our office to schedule the follow-up appointment.   Mikael Spray, MD  03/07/2015 1:14 PM    Sanda Klein, MD, [facility] [phone] office [phone] pager

## 2015-03-05 NOTE — Patient Instructions (Signed)
Dr Sallyanne Kuster has recommended making the following medication changes: STOP LISINOPRIL INCREASE Propranolol to 1.5 tablets twice daily. A new prescription has been sent to your pharmacy electronically.  Dr Sallyanne Kuster recommends that you schedule a follow-up appointment in 1 year. You will receive a reminder letter in the mail two months in advance. If you don't receive a letter, please call our office to schedule the follow-up appointment.

## 2015-03-07 ENCOUNTER — Encounter: Payer: Self-pay | Admitting: Cardiovascular Disease

## 2015-03-23 ENCOUNTER — Encounter: Payer: Self-pay | Admitting: Family Medicine

## 2015-03-23 ENCOUNTER — Telehealth: Payer: Self-pay | Admitting: Family Medicine

## 2015-03-23 NOTE — Progress Notes (Signed)
Mr. Christopher Weeks is dropping off forms to be completed to certify his disability. Please call Mr. Christopher Weeks at [phone] when it is ready for pick up. Thank you, Fonda Kinder, ASA

## 2015-03-26 NOTE — Progress Notes (Signed)
Patient ID: Christopher Weeks, male   DOB: Apr 24, 1951, 64 y.o.   MRN: [id number] Form completed

## 2015-03-26 NOTE — Progress Notes (Signed)
Will place form in provider's box.  There is nothing for clinic staff to complete. Jazmin Hartsell,CMA

## 2015-03-27 NOTE — Progress Notes (Signed)
Pt informed that form is complete and ready for pick up.  Derl Barrow, RN

## 2015-06-13 NOTE — Telephone Encounter (Signed)
Erroneous

## 2015-07-11 ENCOUNTER — Encounter: Payer: Self-pay | Admitting: Family Medicine

## 2015-07-11 ENCOUNTER — Ambulatory Visit (INDEPENDENT_AMBULATORY_CARE_PROVIDER_SITE_OTHER): Payer: PPO | Admitting: Family Medicine

## 2015-07-11 VITALS — BP 187/87 | HR 45 | Temp 98.1°F | Ht 72.0 in | Wt 205.5 lb

## 2015-07-11 DIAGNOSIS — F329 Major depressive disorder, single episode, unspecified: Secondary | ICD-10-CM

## 2015-07-11 DIAGNOSIS — F321 Major depressive disorder, single episode, moderate: Secondary | ICD-10-CM

## 2015-07-11 DIAGNOSIS — I10 Essential (primary) hypertension: Secondary | ICD-10-CM

## 2015-07-11 HISTORY — DX: Major depressive disorder, single episode, unspecified: F32.9

## 2015-07-11 MED ORDER — CITALOPRAM HYDROBROMIDE 20 MG PO TABS: 20.0000 mg | ORAL_TABLET | Freq: Every day | ORAL | Status: DC

## 2015-07-11 NOTE — Assessment & Plan Note (Addendum)
Up today but need to base on home readings. No additional testing today due to insurance issues.

## 2015-07-11 NOTE — Assessment & Plan Note (Signed)
Fits criteria.  Start celexa.  Recheck in 6-8 weeks.  Greater than 50% counseling.  Duration of visit 30 minutes.

## 2015-07-11 NOTE — Progress Notes (Signed)
   Subjective:    Patient ID: Christopher Weeks, male    DOB: [birth year], 64 y.o.   MRN: [id number]  HPI Main issue is depression/irritability.  Having trouble adjusting to his declining health status.  He feels comfortable in the role of caregiver, not as care receiver.  Snapping at wife.  Tearful in office.  Easier to anger.  Both he and wife agree he needs treatment. Some sleep change.  No SI or HI. No previous history of depression.  Also BP up.  He was upset in that there may be a new problem with his health insurance.  Has known white coat hypertension.       Review of Systems     Objective:   Physical Exam Sad, teary at times.  Rational throughout. Elevated BP confirmed.       Assessment & Plan:

## 2015-07-11 NOTE — Patient Instructions (Signed)
I do think you are depressed and need to be on medications.   I sent in a new prescription for you.  See me in 6-8 weeks.  We will see how it is doing. Next visit bring in your home blood pressure measurements.  I need to decide if I need to increase your medications. I have good confidence that things will improve.

## 2015-07-21 ENCOUNTER — Other Ambulatory Visit: Payer: Self-pay | Admitting: Family Medicine

## 2015-10-08 ENCOUNTER — Other Ambulatory Visit: Payer: Self-pay | Admitting: Family Medicine

## 2015-12-20 ENCOUNTER — Ambulatory Visit (INDEPENDENT_AMBULATORY_CARE_PROVIDER_SITE_OTHER): Payer: PPO | Admitting: Family Medicine

## 2015-12-20 ENCOUNTER — Encounter: Payer: Self-pay | Admitting: Family Medicine

## 2015-12-20 VITALS — BP 160/102 | HR 42 | Temp 98.0°F | Ht 72.0 in | Wt 189.2 lb

## 2015-12-20 DIAGNOSIS — I2584 Coronary atherosclerosis due to calcified coronary lesion: Secondary | ICD-10-CM | POA: Diagnosis not present

## 2015-12-20 DIAGNOSIS — G20 Parkinson's disease: Secondary | ICD-10-CM | POA: Diagnosis not present

## 2015-12-20 DIAGNOSIS — I2510 Atherosclerotic heart disease of native coronary artery without angina pectoris: Secondary | ICD-10-CM | POA: Diagnosis not present

## 2015-12-20 DIAGNOSIS — I10 Essential (primary) hypertension: Secondary | ICD-10-CM | POA: Diagnosis not present

## 2015-12-20 MED ORDER — METOPROLOL SUCCINATE ER 50 MG PO TB24: 50.0000 mg | ORAL_TABLET | Freq: Every day | ORAL | Status: DC

## 2015-12-20 MED ORDER — LISINOPRIL 10 MG PO TABS: 10.0000 mg | ORAL_TABLET | Freq: Every day | ORAL | Status: DC

## 2015-12-20 NOTE — Patient Instructions (Signed)
Stop the propranolol Two new blood pressure medication. See me in 3 weeks to make sure the blood pressure is coming down.

## 2015-12-21 NOTE — Assessment & Plan Note (Signed)
BP considerably higher today.  I am aware of white coat componant to HBP.  Still will add metoprolol and ACE.  Recheck three weeks with BMP

## 2015-12-21 NOTE — Progress Notes (Signed)
   Subjective:    Patient ID: Christopher Weeks, male    DOB: July 16, 1951, 65 y.o.   MRN: [id number]  HPI Elevated BP in patient with previous coronary artery disease and stent.  Really not on any BP meds (propranolol is an attempt to treat the tremor.)  Never MI.  No Chest pain or SOB.  Compliant with meds Parkinson's disease is progressing at a snails pace.  No dementia.  Tremor is main symptom.       Review of Systems     Objective:   Physical Exam Elevated bP is confirmed Lungs clear  Cardiac RRR without m or g Typical pill rolling tremor Rt hand.  Not so much left.  Gait normal.  No masked faces.  Oriented x3        Assessment & Plan:

## 2015-12-21 NOTE — Assessment & Plan Note (Signed)
No pain.  Concerned that not on ACE or Beta blocker.  Will add now to treat HBP.  Switch propranolol to metoprolol at higher dose.

## 2015-12-21 NOTE — Assessment & Plan Note (Signed)
Stable on current meds 

## 2016-01-16 ENCOUNTER — Ambulatory Visit: Payer: PPO | Admitting: Family Medicine

## 2016-01-23 ENCOUNTER — Ambulatory Visit: Payer: PPO | Admitting: Family Medicine

## 2016-01-31 ENCOUNTER — Encounter: Payer: Self-pay | Admitting: Family Medicine

## 2016-01-31 ENCOUNTER — Ambulatory Visit (INDEPENDENT_AMBULATORY_CARE_PROVIDER_SITE_OTHER): Payer: PPO | Admitting: Family Medicine

## 2016-01-31 VITALS — BP 180/79 | HR 42 | Temp 98.4°F | Ht 72.0 in | Wt 207.3 lb

## 2016-01-31 DIAGNOSIS — Z1159 Encounter for screening for other viral diseases: Secondary | ICD-10-CM | POA: Diagnosis not present

## 2016-01-31 DIAGNOSIS — I2510 Atherosclerotic heart disease of native coronary artery without angina pectoris: Secondary | ICD-10-CM

## 2016-01-31 DIAGNOSIS — I10 Essential (primary) hypertension: Secondary | ICD-10-CM | POA: Diagnosis not present

## 2016-01-31 DIAGNOSIS — T50905A Adverse effect of unspecified drugs, medicaments and biological substances, initial encounter: Secondary | ICD-10-CM

## 2016-01-31 DIAGNOSIS — R001 Bradycardia, unspecified: Secondary | ICD-10-CM

## 2016-01-31 DIAGNOSIS — I2584 Coronary atherosclerosis due to calcified coronary lesion: Secondary | ICD-10-CM

## 2016-01-31 LAB — BASIC METABOLIC PANEL
BUN: 14 mg/dL (ref 7–25)
CHLORIDE: 101 mmol/L (ref 98–110)
CO2: 30 mmol/L (ref 20–31)
CREATININE: 0.91 mg/dL (ref 0.70–1.25)
Calcium: 9.9 mg/dL (ref 8.6–10.3)
Glucose, Bld: 101 mg/dL — ABNORMAL HIGH (ref 65–99)
Potassium: 4.2 mmol/L (ref 3.5–5.3)
Sodium: 140 mmol/L (ref 135–146)

## 2016-01-31 MED ORDER — METOPROLOL SUCCINATE ER 25 MG PO TB24: 25.0000 mg | ORAL_TABLET | Freq: Every day | ORAL | Status: DC

## 2016-01-31 MED ORDER — LISINOPRIL 20 MG PO TABS: 20.0000 mg | ORAL_TABLET | Freq: Every day | ORAL | Status: DC

## 2016-01-31 NOTE — Patient Instructions (Addendum)
I am glad you feel well.  On paper, you have two problems: Your blood pressure is still too high Your heart rate is too low.  The metoprolol makes the low heart rate worse. I am cutting back the metoprolol to 25 mg daily. I want you to increase the lisinopril to 20 mg daily.  You can use up your current supply by taking two pills per day.  Make the dose changes right away and start on another set of home blood pressure measurements.  Include the pulse.    When you see the heart doctor, two things: Ask if he wants you on the metoprolol at all.  That may depend on how low your heart rate is at the lower dose. Ask if he wants you on an even higher dose of lisinopril.  That will depend on your blood pressure readings.   See me about a month after you see your heart doctor.

## 2016-02-01 DIAGNOSIS — T50905A Adverse effect of unspecified drugs, medicaments and biological substances, initial encounter: Secondary | ICD-10-CM

## 2016-02-01 DIAGNOSIS — R001 Bradycardia, unspecified: Secondary | ICD-10-CM | POA: Insufficient documentation

## 2016-02-01 LAB — HEPATITIS C ANTIBODY: HCV AB: NEGATIVE

## 2016-02-01 NOTE — Progress Notes (Signed)
   Subjective:    Patient ID: Christopher Weeks, male    DOB: 1951-03-25, 65 y.o.   MRN: [id number]  HPI  Returns for BP check and continue to adjust BP meds.  Last visit increased B blocker and added ACE.  Home BPs and heart rate are like our readings.  Elevated systolics and bradycardia into the forties.  No complaints.  No syncope or chest pain.   Is SP stent and so it would be nice to keep him on beta blocker.    Review of Systems     Objective:   Physical Exam Confirmed BP Heart RRR without m Lungs clear No peripheral edema.       Assessment & Plan:

## 2016-02-01 NOTE — Assessment & Plan Note (Signed)
Screen x 1.  Low risk 

## 2016-02-01 NOTE — Assessment & Plan Note (Signed)
Given known CAD, would be nice to keep him on beta blocker.  Decrease dose.  Coordinate care with cards.

## 2016-02-01 NOTE — Assessment & Plan Note (Signed)
Still with systolic HBP.  Increase ACE, check BMP  Unfortunately, also need to decrease beta blocker due to bradycardia.

## 2016-02-08 ENCOUNTER — Encounter: Payer: Self-pay | Admitting: Cardiovascular Disease

## 2016-02-08 ENCOUNTER — Ambulatory Visit (INDEPENDENT_AMBULATORY_CARE_PROVIDER_SITE_OTHER): Payer: PPO | Admitting: Cardiovascular Disease

## 2016-02-08 VITALS — BP 160/80 | HR 53 | Ht 72.0 in | Wt 210.0 lb

## 2016-02-08 DIAGNOSIS — R7303 Prediabetes: Secondary | ICD-10-CM

## 2016-02-08 DIAGNOSIS — I10 Essential (primary) hypertension: Secondary | ICD-10-CM

## 2016-02-08 DIAGNOSIS — R001 Bradycardia, unspecified: Secondary | ICD-10-CM | POA: Diagnosis not present

## 2016-02-08 DIAGNOSIS — I25110 Atherosclerotic heart disease of native coronary artery with unstable angina pectoris: Secondary | ICD-10-CM | POA: Diagnosis not present

## 2016-02-08 DIAGNOSIS — E785 Hyperlipidemia, unspecified: Secondary | ICD-10-CM

## 2016-02-08 MED ORDER — NITROGLYCERIN 0.4 MG/SPRAY TL SOLN: 1.0000 | Status: DC | PRN

## 2016-02-08 MED ORDER — ISOSORBIDE MONONITRATE ER 30 MG PO TB24: 30.0000 mg | ORAL_TABLET | Freq: Every day | ORAL | Status: DC

## 2016-02-08 NOTE — Patient Instructions (Signed)
Medication Instructions: Dr Sallyanne Kuster has recommended making the following medication changes: 1. START Isosorbide Mononitrate (Imdur) 30 mg - take 1 tablet by mouth daily  >>A new prescription has been sent to your pharmacy electronically  Labwork: Your physician recommends that you return for lab work at your earliest [place].  Testing/Procedures: 1. [place] physician has requested that you have a lexiscan myoview. For further information please visit [email or web address]. Please follow instruction sheet, as given.  Follow-up: Dr Sallyanne Kuster recommends that you schedule a follow-up appointment in 6 weeks.  If you need a refill on your cardiac medications before your next appointment, please call your pharmacy.

## 2016-02-08 NOTE — Progress Notes (Signed)
Patient ID: Christopher Weeks, male   DOB: Aug 05, 1951, 65 y.o.   MRN: [id number]    Cardiology Office Note    Date:  02/08/2016   ID:  Christopher Weeks, DOB Feb 19, 1951, MRN [id number]  PCP:  Zigmund Gottron, MD  Cardiologist:    Sanda Klein, MD   Chief Complaint  Patient presents with  . Advice Only    bradycardia    History of Present Illness:  Christopher Weeks is a 65 y.o. male with a history of unstable angina leading to placement of a drug-eluting stent in the right coronary artery in September 2013, without subsequent coronary events. Additional comorbid conditions include hyperlipidemia, prediabetes, essential hypertension and Parkinson's disease.  Christopher Weeks has recently had bradycardia and Dr. Andria Frames reduced his dose of metoprolol from 50 mg daily to 25 mg. After that change his heart rate at home is still in the upper 40s. He has not expressed dizziness, lightheadedness, fatigue or syncope, but is quite inactive due to Parkinson's disease. The very next day he experienced a "knot" in his epigastric area, reminiscent of previous anginal symptoms. He has had a constant nagging discomfort in that area ever since, not associated with meals. His electrocardiogram today is normal. It does show bradycardia at 53 bpm.    Past Medical History  Diagnosis Date  . Parkinson disease ([place])   . Hypertension   . Dysrhythmia   . Heart murmur   . Kidney stones   . Parkinson's disease [facility])     Past Surgical History  Procedure Laterality Date  . 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 Coronary stent placement  07/11/12  . Cardiac catheterization  07/11/2012    high graded stenoses small ift circ and small 1st diag   which were  left for medicl therapy, lv systoilc fx preserved  . Coronary angioplasty  07/11/2012    90% and 70% lesions RCA  3.0 x32 mm Promus DES  . Left heart cath N/A 07/11/2012    Procedure: LEFT HEART CATH;  Surgeon: Sanda Klein, MD;  Location: [facility] CATH LAB;  Service: Cardiovascular;  Laterality:  N/A;  . Percutaneous coronary stent intervention (pci-s) Right 07/11/2012    Procedure: PERCUTANEOUS CORONARY STENT INTERVENTION (PCI-S);  Surgeon: Sanda Klein, MD;  Location: [facility] CATH LAB;  Service: Cardiovascular;  Laterality: Right;    Current Medications: Outpatient Prescriptions Prior to Visit  Medication Sig Dispense Refill  . aspirin 81 MG tablet Take 81 mg by mouth daily.      Marland Kitchen atorvastatin (LIPITOR) 80 MG tablet TAKE (1) TABLET BY MOUTH AT BEDTIME. 90 tablet 3  . Calcium-Vitamin D 600-125 MG-UNIT TABS 1 by mouth daily     . carbidopa-levodopa (SINEMET IR) 25-100 MG per tablet TAKE 2 TABLETS BY MOUTH THREE TIMES DAILY. 540 tablet 3  . citalopram (CELEXA) 20 MG tablet TAKE ONE TABLET BY MOUTH DAILY. 90 tablet 3  . lisinopril (PRINIVIL,ZESTRIL) 20 MG tablet Take 1 tablet (20 mg total) by mouth daily. (Patient taking differently: Take 20 mg by mouth 2 (two) times daily. ) 90 tablet 3  . metoprolol succinate (TOPROL-XL) 25 MG 24 hr tablet Take 1 tablet (25 mg total) by mouth daily. Take with or immediately following a meal. 90 tablet 3  . Multiple Vitamin (MULTIVITAMIN) tablet Take 1 tablet by mouth daily.      . ranitidine (ZANTAC) 150 MG capsule Take 150 mg by mouth 2 (two) times daily.    . nitroGLYCERIN (NITROLINGUAL) 0.4 MG/SPRAY spray  No facility-administered medications prior to visit.     Allergies:   Codeine   Social History   Social History  . Marital Status: Married    Spouse Name: Vickie  . Number of Children: 2  . Years of Education: 12   Occupational History  . Disabled-maintenance work    Social History Main Topics  . Smoking status: Former Smoker -- 1.00 packs/day for 20 years    Types: Cigarettes    Quit date: 11/10/1996  . Smokeless tobacco: Never Used  . Alcohol Use: No  . Drug Use: Yes    Special: Marijuana     Comment: Has a few puffs 3-4x a day.   Marland Kitchen Sexual Activity: Yes     Comment: monogamous   Other Topics Concern  . None   Social  History Narrative     Family History:  The patient's family history is not known. He was adopted.   ROS:   Please see the history of present illness.    ROS All other systems reviewed and are negative.   PHYSICAL EXAM:   VS:  BP 160/80 mmHg  Pulse 53  Ht 6' (1.829 m)  Wt 95.255 kg (210 lb)  BMI 28.47 kg/m2   GEN: Well nourished, well developed, in no acute distress HEENT: normal Neck: no JVD, carotid bruits, or masses Cardiac: Bradycardia, but RRR; no murmurs, rubs, or gallops,no edema  Respiratory:  clear to auscultation bilaterally, normal work of breathing GI: soft, nontender, nondistended, + BS MS: no deformity or atrophy Skin: warm and dry, no rash Neuro:  Alert and Oriented x 3, Strength and sensation are intact Psych: euthymic mood, full affect  Wt Readings from Last 3 Encounters:  02/08/16 95.255 kg (210 lb)  01/31/16 94.031 kg (207 lb 4.8 oz)  12/20/15 85.821 kg (189 lb 3.2 oz)      Studies/Labs Reviewed:   EKG:  EKG is ordered today.  The ekg ordered today demonstrates Marked sinus bradycardia, otherwise normal  Recent Labs: 01/31/2016: BUN 14; Creat 0.91; Potassium 4.2; Sodium 140   Lipid Panel    Component Value Date/Time   CHOL 154 12/27/2014 1412   TRIG 154* 12/27/2014 1412   HDL 37* 12/27/2014 1412   CHOLHDL 4.2 12/27/2014 1412   VLDL 31 12/27/2014 1412   LDLCALC 86 12/27/2014 1412    Additional studies/ records that were reviewed today include:  Notes from Dr. Andria Frames    ASSESSMENT:    1. Coronary artery disease involving native coronary artery of native heart with unstable angina pectoris ([place])   2. Bradycardia   3. Dyslipidemia   4. Pre-diabetes   5. Essential hypertension, benign      PLAN:  In order of problems listed above:  1. CAD: Unfortunately, after a decrease in beta blocker dose, Christopher Weeks has developed symptoms suggestive of angina for the first time since his stent implantation. I think it will be important to make sure he  does not have any sizable areas of reversible ischemia on a Lexiscan Myoview. I like to find that out before we further adjust his beta blocker. Will try long-acting nitrates to see if these are beneficial for his chest discomfort. If extensive ischemia is identified he should have coronary angiography. No high-risk features on ECG. 2. Bradycardia : Thankfully, he has not had syncope, fatigue or other symptoms related to the bradycardia. I agree that a heart rate in the 40s is a little too low and I would also like to discontinue his  beta blocker gradually but will delay that decision until we have the results of the stress test. Note that his heart rate is slower than one would expect from such a low dose of beta blocker and it is possible that he has some degree of sinus node dysfunction. 3. HLP: Last lipid profile was performed in October 2016, will recheck. Continue statin. 4. PreDM: Recent fasting glucose 101 5. HTN: Systolic blood pressure today was high, but at home his blood pressure has mostly been in the 130s. After a stress test, if we stop his beta blocker would probably add amlodipine. Note that he had syncope, probably due to orthostatic hypotension, when he was taking a thiazide diuretic.    Medication Adjustments/Labs and Tests Ordered: Current medicines are reviewed at length with the patient today.  Concerns regarding medicines are outlined above.  Medication changes, Labs and Tests ordered today are listed in the Patient Instructions below. Patient Instructions  Medication Instructions: Dr Sallyanne Kuster has recommended making the following medication changes: 1. START Isosorbide Mononitrate (Imdur) 30 mg - take 1 tablet by mouth daily  >>A new prescription has been sent to your pharmacy electronically  Labwork: Your physician recommends that you return for lab work at your earliest [place].  Testing/Procedures: 1. [place] physician has requested that  you have a lexiscan myoview. For further information please visit [email or web address]. Please follow instruction sheet, as given.  Follow-up: Dr Sallyanne Kuster recommends that you schedule a follow-up appointment in 6 weeks.  If you need a refill on your cardiac medications before your next appointment, please call your pharmacy.     Mikael Spray, MD  02/08/2016 10:18 AM    [facility] [address], [place], [place]  [address] Phone: [phone]; Fax: [phone]

## 2016-02-12 ENCOUNTER — Encounter (HOSPITAL_COMMUNITY)
Admission: RE | Admit: 2016-02-12 | Discharge: 2016-02-12 | Disposition: A | Discharge status: Home / Self Care | Payer: PPO | Source: Ambulatory Visit | Attending: Cardiovascular Disease | Admitting: Cardiovascular Disease

## 2016-02-12 ENCOUNTER — Telehealth: Payer: Self-pay

## 2016-02-12 ENCOUNTER — Inpatient Hospital Stay (HOSPITAL_COMMUNITY): Admission: RE | Admit: 2016-02-12 | Payer: PPO | Source: Ambulatory Visit

## 2016-02-12 ENCOUNTER — Encounter (HOSPITAL_COMMUNITY): Payer: Self-pay

## 2016-02-12 DIAGNOSIS — E785 Hyperlipidemia, unspecified: Secondary | ICD-10-CM | POA: Diagnosis not present

## 2016-02-12 DIAGNOSIS — I25110 Atherosclerotic heart disease of native coronary artery with unstable angina pectoris: Secondary | ICD-10-CM | POA: Diagnosis not present

## 2016-02-12 LAB — NM MYOCAR MULTI W/SPECT W/WALL MOTION / EF
CHL CUP NUCLEAR SDS: 1
CHL CUP NUCLEAR SRS: 0
LHR: 0.11
LV dias vol: 100 mL (ref 62–150)
LV sys vol: 40 mL
Peak HR: 81 {beats}/min
Rest HR: 49 {beats}/min
SSS: 1
TID: 1.18

## 2016-02-12 LAB — LIPID PANEL
CHOLESTEROL: 154 mg/dL (ref 125–200)
HDL: 33 mg/dL — AB (ref >=40)
LDL Cholesterol: 87 mg/dL (ref <130)
Total CHOL/HDL Ratio: 4.7 Ratio (ref <=5.0)
Triglycerides: 169 mg/dL — ABNORMAL HIGH (ref <150)
VLDL: 34 mg/dL — AB (ref <30)

## 2016-02-12 MED ORDER — TECHNETIUM TC 99M SESTAMIBI GENERIC - CARDIOLITE
10.0000 | Freq: Once | INTRAVENOUS | Status: AC | PRN
  Administered 2016-02-12: 10 via INTRAVENOUS

## 2016-02-12 MED ORDER — TECHNETIUM TC 99M SESTAMIBI - CARDIOLITE
30.0000 | Freq: Once | INTRAVENOUS | Status: AC | PRN
  Administered 2016-02-12: 27.8 via INTRAVENOUS

## 2016-02-12 MED ORDER — SODIUM CHLORIDE 0.9% FLUSH
INTRAVENOUS | Status: AC
  Administered 2016-02-12: 10 mL via INTRAVENOUS
  Filled 2016-02-12: qty 10

## 2016-02-12 MED ORDER — REGADENOSON 0.4 MG/5ML IV SOLN
INTRAVENOUS | Status: AC
  Administered 2016-02-12: 0.4 mg via INTRAVENOUS
  Filled 2016-02-12: qty 5

## 2016-02-12 NOTE — Telephone Encounter (Signed)
Nuclear stress test results reviewed with patient's wife.

## 2016-02-27 ENCOUNTER — Other Ambulatory Visit: Payer: Self-pay | Admitting: Family Medicine

## 2016-03-11 NOTE — Progress Notes (Signed)
Patient ID: Christopher Weeks, male   DOB: 1951-09-18, 65 y.o.   MRN: [id number] Patient ID: Christopher Weeks, male   DOB: 1951-02-27, 65 y.o.   MRN: [id number]    Cardiology Office Note    Date:  03/11/2016   ID:  Christopher Weeks, DOB [birth year], MRN [id number]  PCP:  Zigmund Gottron, MD  Cardiologist:    Sanda Klein, MD   No chief complaint on file.   History of Present Illness:  Christopher Weeks is a 65 y.o. male with a history of unstable angina leading to placement of a drug-eluting stent in the right coronary artery in September 2013, without subsequent coronary events. Additional comorbid conditions include hyperlipidemia, prediabetes, essential hypertension and Parkinson's disease.  He feels better since his last couple of appointments when we discontinued beta blockers and started nitrates.  Quita Skye has recently had bradycardia and we have gradually weaned him off beta blockers. Fortunately, he has not expressed dizziness, lightheadedness, fatigue or syncope, but is quite inactive due to Parkinson's disease. Today his heart rate is 57, Although at home use of records heart rates in the 40s at times..  Due to some discomfort in his epigastric area, reminiscent of previous anginal symptoms, he underwent a Lexiscan Myoview on April 4. There was no evidence of ischemia and left ventricular systolic function was normal. He had headaches with long-acting nitrates for a few days, but has developed tolerance to this. After starting those medications he has no longer been troubled by angina.   Past Medical History  Diagnosis Date  . Parkinson disease ([place])   . Hypertension   . Dysrhythmia   . Heart murmur   . Kidney stones   . Parkinson's disease [facility])     Past Surgical History  Procedure Laterality Date  . 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 Coronary stent placement  07/11/12  . Cardiac catheterization  07/11/2012    high graded stenoses small ift circ and small 1st diag   which were  left for medicl  therapy, lv systoilc fx preserved  . Coronary angioplasty  07/11/2012    90% and 70% lesions RCA  3.0 x32 mm Promus DES  . Left heart cath N/A 07/11/2012    Procedure: LEFT HEART CATH;  Surgeon: Sanda Klein, MD;  Location[facility] CATH LAB;  Service: Cardiovascular;  Laterality: N/A;  . Percutaneous coronary stent intervention (pci-s) Right 07/11/2012    Procedure: PERCUTANEOUS CORONARY STENT INTERVENTION (PCI-S);  Surgeon: Sanda Klein, MD;  Location: [facility] CATH LAB;  Service: Cardiovascular;  Laterality: Right;    Current Medications: Outpatient Prescriptions Prior to Visit  Medication Sig Dispense Refill  . aspirin 81 MG tablet Take 81 mg by mouth daily.      Marland Kitchen atorvastatin (LIPITOR) 80 MG tablet TAKE (1) TABLET BY MOUTH AT BEDTIME. 90 tablet 3  . Calcium-Vitamin D 600-125 MG-UNIT TABS 1 by mouth daily     . carbidopa-levodopa (SINEMET IR) 25-100 MG per tablet TAKE 2 TABLETS BY MOUTH THREE TIMES DAILY. 540 tablet 3  . citalopram (CELEXA) 20 MG tablet TAKE ONE TABLET BY MOUTH DAILY. 90 tablet 3  . isosorbide mononitrate (IMDUR) 30 MG 24 hr tablet Take 1 tablet (30 mg total) by mouth daily. 30 tablet 11  . lisinopril (PRINIVIL,ZESTRIL) 20 MG tablet Take 1 tablet (20 mg total) by mouth daily. (Patient taking differently: Take 20 mg by mouth 2 (two) times daily. ) 90 tablet 3  . metoprolol succinate (TOPROL-XL) 25 MG 24 hr tablet  Take 1 tablet (25 mg total) by mouth daily. Take with or immediately following a meal. 90 tablet 3  . Multiple Vitamin (MULTIVITAMIN) tablet Take 1 tablet by mouth daily.      . nitroGLYCERIN (NITROLINGUAL) 0.4 MG/SPRAY spray Place 1 spray under the tongue every 5 (five) minutes x 3 doses as needed for chest pain. 12 g 3  . ranitidine (ZANTAC) 150 MG capsule Take 150 mg by mouth 2 (two) times daily.     No facility-administered medications prior to visit.     Allergies:   Codeine   Social History   Social History  . Marital Status: Married    Spouse Name: Vickie  .  Number of Children: 2  . Years of Education: 12   Occupational History  . Disabled-maintenance work    Social History Main Topics  . Smoking status: Former Smoker -- 1.00 packs/day for 20 years    Types: Cigarettes    Quit date: 11/10/1996  . Smokeless tobacco: Never Used  . Alcohol Use: No  . Drug Use: Yes    Special: Marijuana     Comment: Has a few puffs 3-4x a day.   Marland Kitchen Sexual Activity: Yes     Comment: monogamous   Other Topics Concern  . Not on file   Social History Narrative     Family History:  The patient's family history is not known. He was adopted.   ROS:   Please see the history of present illness.    ROS All other systems reviewed and are negative.   PHYSICAL EXAM:   VS:  There were no vitals taken for this visit.   GEN: Well nourished, well developed, in no acute distress HEENT: normal Neck: no JVD, carotid bruits, or masses Cardiac: Bradycardia, but RRR; no murmurs, rubs, or gallops,no edema  Respiratory:  clear to auscultation bilaterally, normal work of breathing GI: soft, nontender, nondistended, + BS MS: no deformity or atrophy Skin: warm and dry, no rash Neuro:  Alert and Oriented x 3, Strength and sensation are intact Psych: euthymic mood, full affect  Wt Readings from Last 3 Encounters:  02/08/16 95.255 kg (210 lb)  01/31/16 94.031 kg (207 lb 4.8 oz)  12/20/15 85.821 kg (189 lb 3.2 oz)      Studies/Labs Reviewed:   EKG:  EKG is not ordered today.    Recent Labs: 01/31/2016: BUN 14; Creat 0.91; Potassium 4.2; Sodium 140   Lipid Panel    Component Value Date/Time   CHOL 154 02/12/2016 1000   TRIG 169* 02/12/2016 1000   HDL 33* 02/12/2016 1000   CHOLHDL 4.7 02/12/2016 1000   VLDL 34* 02/12/2016 1000   LDLCALC 87 02/12/2016 1000      ASSESSMENT:    1. Coronary artery disease involving native coronary artery of native heart with other form of angina pectoris ([place])   2. Drug-induced bradycardia   3. Hyperlipidemia   4.  Pre-diabetes   5. Essential hypertension, benign      PLAN:  In order of problems listed above:  1. CAD: Very recent low risk myocardial perfusion study. He does not have angina pectoris on current dose of nitrates and seems to be tolerating them reasonably well 2. Bradycardia : He has not had symptoms related to the bradycardia. During his nuclear stress test his heart rate was between 51 and 68 bpm. It is possible that he has some degree of sinus node dysfunction. Avoid beta blockers and other negative chronotropic agents in the  future. 3. HLP: Lipid profile is not bad, but ideally would get his LDL down to 70. Continue statin. Not sure it is justified to add ezetimibe or PCS K9 inhibitor. Ideally he would lose some weight. Unfortunately he can't really be physically active due to his Parkinson's disease 4. PreDM: Recent fasting glucose 101 5. HTN: Systolic blood pressure today was high, but he has had syncope, probably due to orthostatic hypotension, when he was taking a thiazide diuretic. Avoid diuretics. Discussed the trade-off between risk of orthostatic syncope versus angina due to elevated systolic blood pressure. At this point he prefers not to change his medication since he feels so well.    Medication Adjustments/Labs and Tests Ordered: Current medicines are reviewed at length with the patient today.  Concerns regarding medicines are outlined above.  Medication changes, Labs and Tests ordered today are listed in the Patient Instructions below. There are no Patient Instructions on file for this visit.   Mikael Spray, MD  03/11/2016 5:51 PM    [facility] [address], [place], [place]  [address] Phone: [phone]; Fax: [phone]

## 2016-03-12 ENCOUNTER — Ambulatory Visit (INDEPENDENT_AMBULATORY_CARE_PROVIDER_SITE_OTHER): Payer: PPO | Admitting: Cardiovascular Disease

## 2016-03-12 ENCOUNTER — Encounter: Payer: Self-pay | Admitting: Cardiovascular Disease

## 2016-03-12 VITALS — BP 156/82 | HR 57 | Ht 72.0 in | Wt 211.1 lb

## 2016-03-12 DIAGNOSIS — E785 Hyperlipidemia, unspecified: Secondary | ICD-10-CM

## 2016-03-12 DIAGNOSIS — T50905A Adverse effect of unspecified drugs, medicaments and biological substances, initial encounter: Secondary | ICD-10-CM

## 2016-03-12 DIAGNOSIS — R7303 Prediabetes: Secondary | ICD-10-CM | POA: Diagnosis not present

## 2016-03-12 DIAGNOSIS — I10 Essential (primary) hypertension: Secondary | ICD-10-CM

## 2016-03-12 DIAGNOSIS — R001 Bradycardia, unspecified: Secondary | ICD-10-CM | POA: Diagnosis not present

## 2016-03-12 DIAGNOSIS — I25118 Atherosclerotic heart disease of native coronary artery with other forms of angina pectoris: Secondary | ICD-10-CM | POA: Diagnosis not present

## 2016-03-12 MED ORDER — ISOSORBIDE MONONITRATE ER 30 MG PO TB24: 30.0000 mg | ORAL_TABLET | Freq: Every day | ORAL | Status: DC

## 2016-03-12 MED ORDER — LISINOPRIL 20 MG PO TABS: 20.0000 mg | ORAL_TABLET | Freq: Every day | ORAL | Status: DC

## 2016-03-12 NOTE — Patient Instructions (Signed)
Your physician recommends that you continue on your current medications as directed. Please refer to the Current Medication list given to you today.  Dr Croitoru recommends that you schedule a follow-up appointment in 6 months. You will receive a reminder letter in the mail two months in advance. If you don't receive a letter, please call our office to schedule the follow-up appointment.  If you need a refill on your cardiac medications before your next appointment, please call your pharmacy. 

## 2016-03-13 ENCOUNTER — Ambulatory Visit (INDEPENDENT_AMBULATORY_CARE_PROVIDER_SITE_OTHER): Payer: PPO | Admitting: Family Medicine

## 2016-03-13 ENCOUNTER — Encounter: Payer: Self-pay | Admitting: Family Medicine

## 2016-03-13 VITALS — Temp 98.4°F | Ht 72.0 in | Wt 208.8 lb

## 2016-03-13 DIAGNOSIS — R001 Bradycardia, unspecified: Secondary | ICD-10-CM | POA: Diagnosis not present

## 2016-03-13 DIAGNOSIS — T50905A Adverse effect of unspecified drugs, medicaments and biological substances, initial encounter: Secondary | ICD-10-CM | POA: Diagnosis not present

## 2016-03-13 DIAGNOSIS — I10 Essential (primary) hypertension: Secondary | ICD-10-CM

## 2016-03-13 NOTE — Patient Instructions (Signed)
I am not sure if you should still be taking the metoprolol.  I will talk you your cardiologist and then let you know.  Do not refill this medicine until you hear from.   Please do refill the new medicine, isosorbide mononitrate.   I will not act on the elevated blood pressure today since you have missed a couple of pills.  Keep track when you are back on the medicine and let know if it is high. Remember, you promised to get the colonoscopy done in the next few months.

## 2016-03-14 NOTE — Progress Notes (Signed)
   Subjective:    Patient ID: Christopher Weeks, male    DOB: 1951-07-11, 65 y.o.   MRN: [id number]  HPI Touching base with me after cardiology visit.  Feeling better.  No chest pain.  Still bradycardic with me - a bit of confusion.  Cards note indicates weaned of Beta blocker and med list still has on metoprolol - will check with cards. BP up a bit today.  He has not yet refilled his Imdur.  Also office BPs tend to run high for him.  He is good about checking home BPs. Needs colon cancer screening.  He promised to get this done now that he has completed his cards work up.    Review of Systems     Objective:   Physical Exam  VS noted Lungs clear Cardiac RRR without m or g       Assessment & Plan:

## 2016-03-14 NOTE — Assessment & Plan Note (Signed)
Discuss metoprolol with cards.

## 2016-03-14 NOTE — Assessment & Plan Note (Addendum)
Fill imdur.  Home BPs and let me know. I will find out about metoprolol and call him.  Discussed with cards.  Yes, DC metoprolol.  Patient informed.

## 2016-03-14 NOTE — Addendum Note (Signed)
Addended by: Zenia Resides on: 03/14/2016 12:22 PM   Modules accepted: Orders, Medications

## 2016-05-14 ENCOUNTER — Other Ambulatory Visit: Payer: Self-pay | Admitting: Family Medicine

## 2016-06-12 ENCOUNTER — Ambulatory Visit (INDEPENDENT_AMBULATORY_CARE_PROVIDER_SITE_OTHER): Payer: PPO | Admitting: Family Medicine

## 2016-06-12 ENCOUNTER — Encounter: Payer: Self-pay | Admitting: Family Medicine

## 2016-06-12 VITALS — BP 138/79 | HR 53 | Temp 98.2°F | Ht 72.0 in | Wt 208.0 lb

## 2016-06-12 DIAGNOSIS — G20 Parkinson's disease: Secondary | ICD-10-CM | POA: Diagnosis not present

## 2016-06-12 DIAGNOSIS — I10 Essential (primary) hypertension: Secondary | ICD-10-CM | POA: Diagnosis not present

## 2016-06-12 DIAGNOSIS — Z1211 Encounter for screening for malignant neoplasm of colon: Secondary | ICD-10-CM | POA: Diagnosis not present

## 2016-06-12 DIAGNOSIS — G20A1 Parkinson's disease without dyskinesia, without mention of fluctuations: Secondary | ICD-10-CM

## 2016-06-12 DIAGNOSIS — Z23 Encounter for immunization: Secondary | ICD-10-CM | POA: Diagnosis not present

## 2016-06-12 DIAGNOSIS — I2510 Atherosclerotic heart disease of native coronary artery without angina pectoris: Secondary | ICD-10-CM

## 2016-06-12 NOTE — Patient Instructions (Signed)
I really like the way everything is going right now.  No changes Please do get a colonoscopy.  You are due. Also, let me know when you get your flu shot so that I can update your records.   You will get a pneumovax (pneumonia vaccine) today. Provided things continue to go well, I will see you in 6 months.

## 2016-06-13 NOTE — Assessment & Plan Note (Signed)
Given information for colonscopy sceduling.

## 2016-06-13 NOTE — Assessment & Plan Note (Signed)
Nicely controled. 

## 2016-06-13 NOTE — Assessment & Plan Note (Signed)
Nicely stable 

## 2016-06-13 NOTE — Progress Notes (Signed)
   Subjective:    Patient ID: Christopher Weeks, male    DOB: [birth year], 65 y.o.   MRN: [id number]  HPI Mr. Awadallah is doing great. 1 no chest pain.  Taking CAD meds without side effects. 2. BP controled.  No side effects. 3. Parkinsons - Has found a natural remedy that seems to help his tremor.  Parkinsons has been very slowly progressive. 4. Due for colonoscopy. 5. Mood is great.     Review of Systems     Objective:   Physical Exam Lungs clear Cardiac RRR without m or g Abd benign.       Assessment & Plan:

## 2016-06-13 NOTE — Assessment & Plan Note (Signed)
Nicely controled on current meds 

## 2016-08-14 ENCOUNTER — Telehealth: Payer: Self-pay | Admitting: *Deleted

## 2016-08-14 NOTE — Telephone Encounter (Signed)
Patient's wife called to report that patient received flu vaccine today at [facility] today.  Derl Barrow, RN

## 2016-09-16 ENCOUNTER — Encounter: Payer: Self-pay | Admitting: Physician Assistant

## 2016-09-18 ENCOUNTER — Ambulatory Visit (INDEPENDENT_AMBULATORY_CARE_PROVIDER_SITE_OTHER): Payer: PPO | Admitting: Physician Assistant

## 2016-09-18 ENCOUNTER — Encounter: Payer: Self-pay | Admitting: Physician Assistant

## 2016-09-18 VITALS — BP 164/93 | HR 54 | Ht 72.0 in | Wt 215.8 lb

## 2016-09-18 DIAGNOSIS — E785 Hyperlipidemia, unspecified: Secondary | ICD-10-CM | POA: Diagnosis not present

## 2016-09-18 DIAGNOSIS — I10 Essential (primary) hypertension: Secondary | ICD-10-CM | POA: Diagnosis not present

## 2016-09-18 DIAGNOSIS — I2510 Atherosclerotic heart disease of native coronary artery without angina pectoris: Secondary | ICD-10-CM

## 2016-09-18 NOTE — Progress Notes (Signed)
Cardiology Office Note   Date:  09/18/2016   ID:  SEELEY LETTER, DOB Oct 06, 1951, MRN [id number]  PCP:  Zigmund Gottron, MD  Cardiologist:  Dr. Sallyanne Kuster, 03/11/2016  Rosaria Ferries, PA-C   Chief Complaint  Patient presents with  . Follow-up    History of Present Illness: Christopher Weeks is a 65 y.o. male with a history of DES RCA 07/2012, HTN, Parkinson's dz, kidney stones, SEM, HLD, pre-DM. BB d/c'd 2nd side effects & bradycardia, nitrates added. MV 02/2016 no ischemia, EF ok. Hx syncope 2nd thiazide diuretics  Christopher Weeks presents for scheduled followup  He feels he is doing well. He walks his dog for a mile every morning. He push-mows the yard and uses a leaf blower without difficulty.  He is eating pretty well. Feels like he doesn't eat that much at a time. Surprised to hear his weight is up. Breathing is ok. No LE edema, orthopnea or PND. No palpitations. No presyncope or syncope. No dizziness. BP at home runs 140s/60s, much better than when in MDs office. Aware of his heart murmur.  He generally feels well and is happy with his general health. No ischemic symptoms. Has not had any chest pain.    Past Medical History:  Diagnosis Date  . Dysrhythmia   . Heart murmur   . Hypertension   . Kidney stones   . Parkinson disease ([place])   . Parkinson's disease [facility])     Past Surgical History:  Procedure Laterality Date  . anal Energy manager    . CARDIAC CATHETERIZATION  07/11/2012   high graded stenoses small ift circ and small 1st diag   which were  left for medicl therapy, lv systoilc fx preserved  . CORONARY ANGIOPLASTY  07/11/2012   90% and 70% lesions RCA  3.0 x32 mm Promus DES  . CORONARY STENT PLACEMENT  07/11/12  . LEFT HEART CATH N/A 07/11/2012   Procedure: LEFT HEART CATH;  Surgeon: Sanda Klein, MD;  Location: [place] CATH LAB;  Service: Cardiovascular;  Laterality: N/A;  . PERCUTANEOUS CORONARY STENT INTERVENTION (PCI-S) Right 07/11/2012   Procedure: PERCUTANEOUS  CORONARY STENT INTERVENTION (PCI-S);  Surgeon: Sanda Klein, MD;  Location[facility] CATH LAB;  Service: Cardiovascular;  Laterality: Right;    Current Outpatient Prescriptions  Medication Sig Dispense Refill  . aspirin 81 MG tablet Take 81 mg by mouth daily.      Marland Kitchen atorvastatin (LIPITOR) 80 MG tablet TAKE (1) TABLET BY MOUTH AT BEDTIME. 90 tablet 3  . Calcium-Vitamin D 600-125 MG-UNIT TABS 1 by mouth daily     . carbidopa-levodopa (SINEMET IR) 25-100 MG tablet TAKE 2 TABLETS BY MOUTH THREE TIMES DAILY. 180 tablet 0  . citalopram (CELEXA) 20 MG tablet TAKE ONE TABLET BY MOUTH DAILY. 90 tablet 3  . isosorbide mononitrate (IMDUR) 30 MG 24 hr tablet Take 1 tablet (30 mg total) by mouth daily. 90 tablet 3  . lisinopril (PRINIVIL,ZESTRIL) 20 MG tablet Take 1 tablet (20 mg total) by mouth daily. 90 tablet 3  . Multiple Vitamin (MULTIVITAMIN) tablet Take 1 tablet by mouth daily.      . nitroGLYCERIN (NITROLINGUAL) 0.4 MG/SPRAY spray Place 1 spray under the tongue every 5 (five) minutes x 3 doses as needed for chest pain. 12 g 3  . ranitidine (ZANTAC) 150 MG capsule Take 150 mg by mouth 2 (two) times daily.     No current facility-administered medications for this visit.     Allergies:   Codeine  Social History:  The patient  reports that he quit smoking about 19 years ago. His smoking use included Cigarettes. He has a 20.00 pack-year smoking history. He has never used smokeless tobacco. He reports that he uses drugs, including Marijuana. He reports that he does not drink alcohol.   Family History:  The patient's family history is not on file. He was adopted.    ROS:  Please see the history of present illness. All other systems are reviewed and negative.    PHYSICAL EXAM: VS:  BP (!) 164/93   Pulse (!) 54   Ht 6' (1.829 m)   Wt 215 lb 12.8 oz (97.9 kg)   BMI 29.27 kg/m  , BMI Body mass index is 29.27 kg/m. GEN: Well nourished, well developed, male in no acute distress  HEENT: normal for  age  Neck: no JVD, no carotid bruit, no masses Cardiac: RRR; 2/6 murmur, no rubs, or gallops Respiratory:  clear to auscultation bilaterally, normal work of breathing GI: soft, nontender, nondistended, + BS MS: no deformity or atrophy; no edema; distal pulses are 2+ in all 4 extremities   Skin: warm and dry, no rash Neuro:  Strength and sensation are intact Psych: euthymic mood, full affect   EKG:  EKG is not ordered today.   Recent Labs: 01/31/2016: BUN 14; Creat 0.91; Potassium 4.2; Sodium 140    Lipid Panel    Component Value Date/Time   CHOL 154 02/12/2016 1000   TRIG 169 (H) 02/12/2016 1000   HDL 33 (L) 02/12/2016 1000   CHOLHDL 4.7 02/12/2016 1000   VLDL 34 (H) 02/12/2016 1000   LDLCALC 87 02/12/2016 1000     Wt Readings from Last 3 Encounters:  09/18/16 215 lb 12.8 oz (97.9 kg)  06/12/16 208 lb (94.3 kg)  03/13/16 208 lb 12.8 oz (94.7 kg)     Other studies Reviewed: Additional studies/ records that were reviewed today include: office notes, testing and records.  ASSESSMENT AND PLAN:  1.  CAD: No ongoing ischemic sx. On good therapy w/ ASA, statin, Imdur, ACE, SL NTG prn. No BB due to bradycardia, side effects  2. Hyperlipidemia: LDL slightly elevated at 87. Dietary compliance encouraged. He is not sure why he has gained weight, will pay more attention to portion sizes and types of foods he eats. Followed by his primary MD  3. HTN: Pt has white-coat syndrome, w/ BP much higher at MD's offices than at home. Asked him to keep checking it at home, let us know if he starts running higher, we could increase his lisinopril if needed. Keep BP on the high side, he feels much better that way.   Current medicines are reviewed at length with the patient today.  The patient does not have concerns regarding medicines.  The following changes have been made:  None  Labs/ tests ordered today include:  No orders of the defined types were placed in this  encounter.    Disposition:   FU with Dr Sallyanne Kuster  Signed, Rosaria Ferries, PA-C  09/18/2016 1:35 PM    [place] Phone: [phone]; Fax: [phone]  This note was written with the assistance of speech recognition software. Please excuse any transcriptional errors.

## 2016-09-18 NOTE — Patient Instructions (Signed)
Medication Instructions:  Your physician recommends that you continue on your current medications as directed. Please refer to the Current Medication list given to you today.  Labwork: NONE  Testing/Procedures: NONE  Follow-Up: Your physician wants you to follow-up in: 6 months with Dr.Croitoru. You will receive a reminder letter in the mail two months in advance. If you don't receive a letter, please call our office to schedule the follow-up appointment.   If you need a refill on your cardiac medications before your next appointment, please call your pharmacy.

## 2016-09-22 NOTE — Progress Notes (Signed)
Thank you :)

## 2016-10-23 ENCOUNTER — Other Ambulatory Visit: Payer: Self-pay | Admitting: Family Medicine

## 2016-10-23 NOTE — Telephone Encounter (Signed)
pPt needs a refill on cabidopa-levodopa. Pt uses [facility].30 day supply. Pt only has enough for this morning, need this refilled ASAP. Please advise. Thanks! ep

## 2016-10-28 ENCOUNTER — Other Ambulatory Visit: Payer: Self-pay | Admitting: Family Medicine

## 2016-10-28 ENCOUNTER — Other Ambulatory Visit: Payer: Self-pay | Admitting: Cardiovascular Disease

## 2016-11-14 ENCOUNTER — Other Ambulatory Visit: Payer: Self-pay | Admitting: Family Medicine

## 2016-11-14 DIAGNOSIS — I10 Essential (primary) hypertension: Secondary | ICD-10-CM

## 2017-01-20 ENCOUNTER — Other Ambulatory Visit: Payer: Self-pay | Admitting: Family Medicine

## 2017-03-02 ENCOUNTER — Other Ambulatory Visit: Payer: Self-pay | Admitting: Cardiovascular Disease

## 2017-03-02 NOTE — Telephone Encounter (Signed)
Rx(s) sent to pharmacy electronically.  

## 2017-04-08 ENCOUNTER — Encounter: Payer: Self-pay | Admitting: Family Medicine

## 2017-04-08 ENCOUNTER — Ambulatory Visit (INDEPENDENT_AMBULATORY_CARE_PROVIDER_SITE_OTHER): Payer: PPO | Admitting: Family Medicine

## 2017-04-08 DIAGNOSIS — Z0000 Encounter for general adult medical examination without abnormal findings: Secondary | ICD-10-CM | POA: Diagnosis not present

## 2017-04-08 DIAGNOSIS — I2510 Atherosclerotic heart disease of native coronary artery without angina pectoris: Secondary | ICD-10-CM

## 2017-04-08 DIAGNOSIS — I10 Essential (primary) hypertension: Secondary | ICD-10-CM

## 2017-04-08 DIAGNOSIS — E785 Hyperlipidemia, unspecified: Secondary | ICD-10-CM | POA: Diagnosis not present

## 2017-04-08 DIAGNOSIS — G20 Parkinson's disease: Secondary | ICD-10-CM | POA: Diagnosis not present

## 2017-04-08 DIAGNOSIS — Z1211 Encounter for screening for malignant neoplasm of colon: Secondary | ICD-10-CM | POA: Diagnosis not present

## 2017-04-08 NOTE — Assessment & Plan Note (Signed)
Good control

## 2017-04-08 NOTE — Assessment & Plan Note (Addendum)
Generally healthy male with good habits.  Needs colonoscopy.  Overweight, needs moderate wt loss.

## 2017-04-08 NOTE — Patient Instructions (Addendum)
You need to schedule your colonoscopy.  You are overdue. I will call with the blood test results.   Do home blood pressures about once a month.  We are looking for readings averaging 140/90 or below.   See me in 6-12 months.

## 2017-04-09 ENCOUNTER — Encounter: Payer: Self-pay | Admitting: Family Medicine

## 2017-04-09 LAB — LIPID PANEL
CHOLESTEROL TOTAL: 148 mg/dL (ref 100–199)
Chol/HDL Ratio: 4 ratio (ref 0.0–5.0)
HDL: 37 mg/dL — AB (ref >39)
LDL Calculated: 61 mg/dL (ref 0–99)
TRIGLYCERIDES: 251 mg/dL — AB (ref 0–149)
VLDL Cholesterol Cal: 50 mg/dL — ABNORMAL HIGH (ref 5–40)

## 2017-04-09 LAB — CMP14+EGFR
A/G RATIO: 2.1 (ref 1.2–2.2)
ALT: 26 IU/L (ref 0–44)
AST: 29 IU/L (ref 0–40)
Albumin: 5 g/dL — ABNORMAL HIGH (ref 3.6–4.8)
Alkaline Phosphatase: 73 IU/L (ref 39–117)
BUN / CREAT RATIO: 13 (ref 10–24)
BUN: 13 mg/dL (ref 8–27)
Bilirubin Total: 0.5 mg/dL (ref 0.0–1.2)
CALCIUM: 10.3 mg/dL — AB (ref 8.6–10.2)
CHLORIDE: 101 mmol/L (ref 96–106)
CO2: 25 mmol/L (ref 18–29)
CREATININE: 1.03 mg/dL (ref 0.76–1.27)
GFR calc Af Amer: 88 mL/min/{1.73_m2} (ref >59)
GFR calc non Af Amer: 76 mL/min/{1.73_m2} (ref >59)
GLOBULIN, TOTAL: 2.4 g/dL (ref 1.5–4.5)
Glucose: 93 mg/dL (ref 65–99)
Potassium: 5 mmol/L (ref 3.5–5.2)
Sodium: 142 mmol/L (ref 134–144)
TOTAL PROTEIN: 7.4 g/dL (ref 6.0–8.5)

## 2017-04-09 LAB — CBC
HEMATOCRIT: 43.5 % (ref 37.5–51.0)
Hemoglobin: 14.6 g/dL (ref 13.0–17.7)
MCH: 31.9 pg (ref 26.6–33.0)
MCHC: 33.6 g/dL (ref 31.5–35.7)
MCV: 95 fL (ref 79–97)
PLATELETS: 239 10*3/uL (ref 150–379)
RBC: 4.57 x10E6/uL (ref 4.14–5.80)
RDW: 13.6 % (ref 12.3–15.4)
WBC: 8.8 10*3/uL (ref 3.4–10.8)

## 2017-04-09 NOTE — Progress Notes (Signed)
   Subjective:    Patient ID: Christopher Weeks, male    DOB: July 02, 1951, 66 y.o.   MRN: [id number]  HPI Here for annual physical.  Actually doing quite well.  He eats a healthy diet.  Does not drink or smoke.  In a monagamous relationship.  No at risk behaviors.  Only current HPDP intervention is due for colonoscopy.  He will turn 67 in 7 months at which point he will need prevnar.  Already received one dose of pneumovax due to Parkinsonism.  Issues: 1. Parkinsons Disease.  Nicely stable.  He has found that CBD oil is a highly effective treatment for his tremor.  Remains on L dopa.  Tolerating well 2. HBP.  Stable on current meds. 3. CAD.  Remains on ASA and statin.  No chest pain.   4. High cholesterol on statin.   Due for lipid panel.       Review of Systems  Denies DOE, CP, change in appetite, bowel or bladder.  No skin lesions.  No weakness, numbness.     Objective:   Physical Exam  VS noted.  Repeat BP OK.  In overweight category with BMI =29. HEENT normal Neck supple Lungs clear Cardiac RRR without m or g Abd benign. Ext WNL.  Mild Parkinsonian tremor.   Neuro WNL  No masked faces.  Gait normal        Assessment & Plan:

## 2017-04-09 NOTE — Assessment & Plan Note (Signed)
Encouraged him to make colonoscopy appointment.

## 2017-04-09 NOTE — Assessment & Plan Note (Signed)
Lipid panel results good on current dose of lipitor.  Does have mild increased triglycerides.  Recommended wt loss is the best treatment.

## 2017-04-09 NOTE — Assessment & Plan Note (Signed)
No chest pain.  Seems to be doing well and controling risk factors.

## 2017-04-09 NOTE — Assessment & Plan Note (Signed)
Nicely stable.  His main symptom all along has been tremor.  Tremor controled with sinement and CBD oil

## 2017-05-25 ENCOUNTER — Telehealth: Payer: Self-pay | Admitting: Family Medicine

## 2017-05-25 NOTE — Telephone Encounter (Signed)
Leg redness began last week and had been worsening.  Seen by a PA friend and given keflex 250 mg bid which started yesterday.  Leg seems somewhat better today.  Advised to increase keflex to 250 mg 4x/day.  To be seen if improvement does not continue.  Denied fever and chills.

## 2017-05-25 NOTE — Telephone Encounter (Signed)
Pt has cellulitis in his leg.  Wife wants to talk to dr Andria Frames.

## 2017-05-28 ENCOUNTER — Telehealth: Payer: Self-pay | Admitting: Family Medicine

## 2017-05-28 MED ORDER — DOXYCYCLINE HYCLATE 100 MG PO TABS: 100.0000 mg | ORAL_TABLET | Freq: Two times a day (BID) | ORAL | 0 refills | Status: DC

## 2017-05-28 NOTE — Telephone Encounter (Signed)
done

## 2017-05-28 NOTE — Telephone Encounter (Signed)
Needs refill on antibotic for cellulitis for his legs.  Dr Andria Frames already knows about this.  Christopher Weeks

## 2017-09-28 ENCOUNTER — Other Ambulatory Visit: Payer: Self-pay | Admitting: Family Medicine

## 2017-10-15 ENCOUNTER — Other Ambulatory Visit: Payer: Self-pay | Admitting: Family Medicine

## 2017-11-16 ENCOUNTER — Other Ambulatory Visit: Payer: Self-pay | Admitting: Family Medicine

## 2017-11-16 DIAGNOSIS — I10 Essential (primary) hypertension: Secondary | ICD-10-CM

## 2017-11-27 ENCOUNTER — Telehealth: Payer: Self-pay | Admitting: *Deleted

## 2017-11-27 ENCOUNTER — Other Ambulatory Visit: Payer: Self-pay | Admitting: Cardiovascular Disease

## 2017-11-27 NOTE — Telephone Encounter (Signed)
Patient's wife left message on nurse line stating pt had blood in urine yesterday but not today. Wonders if this is related to Parkinson's and if she should be concerned. Hubbard Hartshorn, RN, BSN

## 2017-11-30 NOTE — Telephone Encounter (Signed)
Told needs urology visit.  He has seen a urologist at [facility] for lithotripsy.  He will try to make an appointment with that urologist.    I am happy to put in a referral if needed.  He will call back if he needs that referral.   Rush Landmark

## 2017-12-24 ENCOUNTER — Telehealth: Payer: Self-pay | Admitting: Family Medicine

## 2017-12-24 DIAGNOSIS — R319 Hematuria, unspecified: Secondary | ICD-10-CM | POA: Insufficient documentation

## 2017-12-24 HISTORY — DX: Hematuria, unspecified: R31.9

## 2017-12-24 NOTE — Telephone Encounter (Signed)
Can we put a referral to Urology for [facility]. He will be seeing Dr.Javaid  In [place]. jw

## 2017-12-24 NOTE — Assessment & Plan Note (Signed)
I believe he needs cystoscopy

## 2017-12-28 ENCOUNTER — Other Ambulatory Visit: Payer: Self-pay | Admitting: Cardiovascular Disease

## 2018-01-07 ENCOUNTER — Other Ambulatory Visit: Payer: Self-pay

## 2018-01-07 ENCOUNTER — Ambulatory Visit (INDEPENDENT_AMBULATORY_CARE_PROVIDER_SITE_OTHER): Payer: PPO | Admitting: Family Medicine

## 2018-01-07 ENCOUNTER — Encounter: Payer: Self-pay | Admitting: Family Medicine

## 2018-01-07 VITALS — BP 130/78 | HR 51 | Temp 98.2°F | Ht 72.0 in | Wt 205.6 lb

## 2018-01-07 DIAGNOSIS — G20 Parkinson's disease: Secondary | ICD-10-CM | POA: Diagnosis not present

## 2018-01-07 DIAGNOSIS — Z0000 Encounter for general adult medical examination without abnormal findings: Secondary | ICD-10-CM | POA: Diagnosis not present

## 2018-01-07 DIAGNOSIS — R319 Hematuria, unspecified: Secondary | ICD-10-CM

## 2018-01-07 DIAGNOSIS — F321 Major depressive disorder, single episode, moderate: Secondary | ICD-10-CM | POA: Diagnosis not present

## 2018-01-07 DIAGNOSIS — Z23 Encounter for immunization: Secondary | ICD-10-CM

## 2018-01-07 LAB — POCT URINALYSIS DIP (MANUAL ENTRY)
BILIRUBIN UA: NEGATIVE
BILIRUBIN UA: NEGATIVE mg/dL
GLUCOSE UA: NEGATIVE mg/dL
NITRITE UA: NEGATIVE
Protein Ur, POC: NEGATIVE mg/dL
RBC UA: NEGATIVE
Spec Grav, UA: 1.02 (ref 1.010–1.025)
Urobilinogen, UA: 0.2 E.U./dL
pH, UA: 7.5 (ref 5.0–8.0)

## 2018-01-07 LAB — POCT UA - MICROSCOPIC ONLY

## 2018-01-07 MED ORDER — CITALOPRAM HYDROBROMIDE 40 MG PO TABS
40.0000 mg | ORAL_TABLET | Freq: Every day | ORAL | 3 refills | Status: DC
Start: 2018-01-07 — End: 2018-10-18

## 2018-01-07 NOTE — Progress Notes (Signed)
   Subjective:    Patient ID: Christopher Weeks, male    DOB: [birth year], 67 y.o.   MRN: [id number]  HPI Multiple concerns: 1. Parkinsons - has been slowly progressive.  Now with bilateral tremaor.  Also, he and wife concerned that sinemet may be affecting his personality.  Not seen by neuro lately.   2. Depression - see #1.  On celexa at 20 mg dose for years.  Would like to try increased dose.  No SI or HI. 3. Recently turned 52.  Due of Prevnar. 4. Overdue of colonoscopy. 5. Hematuria.  Two episodes of painless gross hematuria. Hx of kidney stones but no pain suggestive of stones during time of gross hematuria. I have recommended uro referral - he has yet to go.  Also wonders if referral truly necessary.     Review of Systems     Objective:   Physical Exam Bilateral pill rolling tremor. Affect good in office.   Abd benign, no CVA tenderness.        Assessment & Plan:

## 2018-01-07 NOTE — Assessment & Plan Note (Signed)
Slow progression.  Refer back to neuro for possible tweak of medications.

## 2018-01-07 NOTE — Assessment & Plan Note (Signed)
Prevnar. He will schedule colonoscopy.

## 2018-01-07 NOTE — Patient Instructions (Addendum)
I will check your urine. You are due for a colonoscopy. The nurse will check about the pneumonia shot.   I sent in a prescription for the higher dose citalopram. If you want to tweak the Parkinsons medications, make an appointment with [facility] - Dr. Leonie Man saw you last.

## 2018-01-07 NOTE — Assessment & Plan Note (Signed)
Worse.  Increase celexa.  Also become more active.  Patient and wife have not gotten out much this winter.

## 2018-01-07 NOTE — Assessment & Plan Note (Addendum)
Has not seen uro yet.  No more gross hematuria.  Will check for microscopic hematuria. Informed no hematuria.  Still recommend uro referral.  Patient and wife will consider.

## 2018-01-25 ENCOUNTER — Other Ambulatory Visit: Payer: Self-pay | Admitting: Cardiovascular Disease

## 2018-02-24 ENCOUNTER — Other Ambulatory Visit: Payer: Self-pay | Admitting: Cardiovascular Disease

## 2018-03-18 ENCOUNTER — Other Ambulatory Visit: Payer: Self-pay | Admitting: Cardiovascular Disease

## 2018-03-18 NOTE — Telephone Encounter (Signed)
Rx(s) sent to pharmacy electronically.  

## 2018-04-08 ENCOUNTER — Ambulatory Visit (INDEPENDENT_AMBULATORY_CARE_PROVIDER_SITE_OTHER): Payer: PPO | Admitting: Family Medicine

## 2018-04-08 ENCOUNTER — Encounter: Payer: Self-pay | Admitting: Family Medicine

## 2018-04-08 ENCOUNTER — Telehealth: Payer: Self-pay

## 2018-04-08 ENCOUNTER — Other Ambulatory Visit: Payer: Self-pay

## 2018-04-08 DIAGNOSIS — I10 Essential (primary) hypertension: Secondary | ICD-10-CM

## 2018-04-08 DIAGNOSIS — Z1211 Encounter for screening for malignant neoplasm of colon: Secondary | ICD-10-CM

## 2018-04-08 DIAGNOSIS — I2510 Atherosclerotic heart disease of native coronary artery without angina pectoris: Secondary | ICD-10-CM | POA: Diagnosis not present

## 2018-04-08 DIAGNOSIS — R7303 Prediabetes: Secondary | ICD-10-CM | POA: Diagnosis not present

## 2018-04-08 DIAGNOSIS — G20 Parkinson's disease: Secondary | ICD-10-CM | POA: Diagnosis not present

## 2018-04-08 DIAGNOSIS — E785 Hyperlipidemia, unspecified: Secondary | ICD-10-CM | POA: Diagnosis not present

## 2018-04-08 DIAGNOSIS — K219 Gastro-esophageal reflux disease without esophagitis: Secondary | ICD-10-CM | POA: Diagnosis not present

## 2018-04-08 DIAGNOSIS — Z0000 Encounter for general adult medical examination without abnormal findings: Secondary | ICD-10-CM

## 2018-04-08 LAB — POCT GLYCOSYLATED HEMOGLOBIN (HGB A1C): HBA1C, POC (PREDIABETIC RANGE): 5.9 % (ref 5.7–6.4)

## 2018-04-08 NOTE — Assessment & Plan Note (Signed)
Good control

## 2018-04-08 NOTE — Progress Notes (Signed)
   Subjective:    Patient ID: ZEBULON GANTT, male    DOB: Apr 13, 1951, 67 y.o.   MRN: [id number]  HPI For annual prevention visit 67 yo male who, along with wife, has committed to a healthy lifestyle.  Gave up tobacco and alcohol.  Active with no at risk behaviors.  Issues. 1. Parkinson's disease, mostly manefest by bilateral pill rolling tremor.  Managed by neuro 2. Known CAD Followed by cards.  On statin, aspirin and ACE. 3. GERD - generally managed by ranitidine.  Sometimes has breakthrough discomfort. 4. C/o perianal discomfort.  Due for colonoscopy.  Will refer to GI. 5. HPDP only due for colonoscopy.   Review of Systems No chest pain, DOE, worrisome skin lesions.  No change in mentation, motor or sensory function.  Denies change in appetite, bowel or bladder.         Objective:   Physical ExamHEENT normal Neck supple Lungs clear Cardiac RRR without m or g Abd benign Ext no edema Neuro pill rolling tremor bilaterally.  No cogwheel rigidity.        Assessment & Plan:

## 2018-04-08 NOTE — Assessment & Plan Note (Signed)
Recheck A1C 

## 2018-04-08 NOTE — Assessment & Plan Note (Signed)
Seems nicely stable.  Cont cards FU.

## 2018-04-08 NOTE — Patient Instructions (Addendum)
If the heartburn is really bothering you, call me.  I can switch you off the ranitidine and switch you to a PPI.   Get your colonoscopy I will call next week with lab test results.

## 2018-04-08 NOTE — Assessment & Plan Note (Signed)
Secondary prevention.  Check lipids.

## 2018-04-08 NOTE — Assessment & Plan Note (Signed)
refer

## 2018-04-08 NOTE — Assessment & Plan Note (Signed)
Stable - Continue neuro FU

## 2018-04-08 NOTE — Telephone Encounter (Signed)
Pt's wife called nurse line, patient needing to know date of last colonosocpy- advised per record last colonoscopy was 03/25/11 .Wallace Cullens, RN

## 2018-04-08 NOTE — Assessment & Plan Note (Signed)
Consider prn PPI.  Keep baseline H2 blocker.

## 2018-04-08 NOTE — Assessment & Plan Note (Signed)
Healthy male.  Needs cards and neruo FU.  Needs colonoscopy.

## 2018-04-09 LAB — LIPID PANEL
CHOL/HDL RATIO: 4.1 ratio (ref 0.0–5.0)
CHOLESTEROL TOTAL: 160 mg/dL (ref 100–199)
HDL: 39 mg/dL — ABNORMAL LOW (ref >39)
LDL CALC: 89 mg/dL (ref 0–99)
Triglycerides: 161 mg/dL — ABNORMAL HIGH (ref 0–149)
VLDL Cholesterol Cal: 32 mg/dL (ref 5–40)

## 2018-04-09 LAB — CMP14+EGFR
ALT: 21 IU/L (ref 0–44)
AST: 18 IU/L (ref 0–40)
Albumin/Globulin Ratio: 2 (ref 1.2–2.2)
Albumin: 4.9 g/dL — ABNORMAL HIGH (ref 3.6–4.8)
Alkaline Phosphatase: 79 IU/L (ref 39–117)
BUN / CREAT RATIO: 15 (ref 10–24)
BUN: 17 mg/dL (ref 8–27)
Bilirubin Total: 0.6 mg/dL (ref 0.0–1.2)
CALCIUM: 10.2 mg/dL (ref 8.6–10.2)
CHLORIDE: 101 mmol/L (ref 96–106)
CO2: 24 mmol/L (ref 20–29)
CREATININE: 1.1 mg/dL (ref 0.76–1.27)
GFR calc non Af Amer: 70 mL/min/{1.73_m2} (ref >59)
GFR, EST AFRICAN AMERICAN: 81 mL/min/{1.73_m2} (ref >59)
GLUCOSE: 137 mg/dL — AB (ref 65–99)
Globulin, Total: 2.4 g/dL (ref 1.5–4.5)
Potassium: 4.5 mmol/L (ref 3.5–5.2)
Sodium: 140 mmol/L (ref 134–144)
TOTAL PROTEIN: 7.3 g/dL (ref 6.0–8.5)

## 2018-04-09 LAB — CBC
HEMOGLOBIN: 15.1 g/dL (ref 13.0–17.7)
Hematocrit: 44 % (ref 37.5–51.0)
MCH: 32.3 pg (ref 26.6–33.0)
MCHC: 34.3 g/dL (ref 31.5–35.7)
MCV: 94 fL (ref 79–97)
PLATELETS: 225 10*3/uL (ref 150–450)
RBC: 4.67 x10E6/uL (ref 4.14–5.80)
RDW: 13.5 % (ref 12.3–15.4)
WBC: 7.1 10*3/uL (ref 3.4–10.8)

## 2018-04-15 ENCOUNTER — Encounter (HOSPITAL_COMMUNITY): Payer: Self-pay | Admitting: Emergency Medicine

## 2018-04-15 ENCOUNTER — Other Ambulatory Visit: Payer: Self-pay

## 2018-04-15 ENCOUNTER — Emergency Department (HOSPITAL_COMMUNITY)
Admission: EM | Admit: 2018-04-15 | Discharge: 2018-04-15 | Disposition: A | Discharge status: Home / Self Care | Payer: PPO | Attending: Emergency Medicine | Admitting: Emergency Medicine

## 2018-04-15 ENCOUNTER — Emergency Department (HOSPITAL_COMMUNITY): Payer: PPO

## 2018-04-15 DIAGNOSIS — Z87891 Personal history of nicotine dependence: Secondary | ICD-10-CM | POA: Insufficient documentation

## 2018-04-15 DIAGNOSIS — G20 Parkinson's disease: Secondary | ICD-10-CM | POA: Insufficient documentation

## 2018-04-15 DIAGNOSIS — Z7982 Long term (current) use of aspirin: Secondary | ICD-10-CM | POA: Insufficient documentation

## 2018-04-15 DIAGNOSIS — R109 Unspecified abdominal pain: Secondary | ICD-10-CM | POA: Insufficient documentation

## 2018-04-15 DIAGNOSIS — Z955 Presence of coronary angioplasty implant and graft: Secondary | ICD-10-CM | POA: Insufficient documentation

## 2018-04-15 DIAGNOSIS — N132 Hydronephrosis with renal and ureteral calculous obstruction: Secondary | ICD-10-CM | POA: Diagnosis not present

## 2018-04-15 DIAGNOSIS — I2510 Atherosclerotic heart disease of native coronary artery without angina pectoris: Secondary | ICD-10-CM | POA: Diagnosis not present

## 2018-04-15 LAB — CBC WITH DIFFERENTIAL/PLATELET
BASOS ABS: 0 10*3/uL (ref 0.0–0.1)
Basophils Relative: 0 %
EOS ABS: 0.1 10*3/uL (ref 0.0–0.7)
EOS PCT: 1 %
HCT: 42.4 % (ref 39.0–52.0)
Hemoglobin: 14.5 g/dL (ref 13.0–17.0)
LYMPHS ABS: 1.4 10*3/uL (ref 0.7–4.0)
LYMPHS PCT: 12 %
MCH: 32.4 pg (ref 26.0–34.0)
MCHC: 34.2 g/dL (ref 30.0–36.0)
MCV: 94.6 fL (ref 78.0–100.0)
MONO ABS: 0.7 10*3/uL (ref 0.1–1.0)
Monocytes Relative: 6 %
Neutro Abs: 9.4 10*3/uL — ABNORMAL HIGH (ref 1.7–7.7)
Neutrophils Relative %: 81 %
PLATELETS: 226 10*3/uL (ref 150–400)
RBC: 4.48 MIL/uL (ref 4.22–5.81)
RDW: 12.9 % (ref 11.5–15.5)
WBC: 11.6 10*3/uL — ABNORMAL HIGH (ref 4.0–10.5)

## 2018-04-15 LAB — URINALYSIS, ROUTINE W REFLEX MICROSCOPIC
BILIRUBIN URINE: NEGATIVE
GLUCOSE, UA: NEGATIVE mg/dL
KETONES UR: 5 mg/dL — AB
NITRITE: NEGATIVE
PH: 5 (ref 5.0–8.0)
Protein, ur: 30 mg/dL — AB
Specific Gravity, Urine: 1.025 (ref 1.005–1.030)

## 2018-04-15 LAB — COMPREHENSIVE METABOLIC PANEL
ALT: 8 U/L — ABNORMAL LOW (ref 17–63)
AST: 23 U/L (ref 15–41)
Albumin: 4.7 g/dL (ref 3.5–5.0)
Alkaline Phosphatase: 74 U/L (ref 38–126)
Anion gap: 11 (ref 5–15)
BUN: 15 mg/dL (ref 6–20)
CHLORIDE: 105 mmol/L (ref 101–111)
CO2: 28 mmol/L (ref 22–32)
Calcium: 10 mg/dL (ref 8.9–10.3)
Creatinine, Ser: 1.23 mg/dL (ref 0.61–1.24)
GFR, EST NON AFRICAN AMERICAN: 60 mL/min — AB (ref >60)
Glucose, Bld: 149 mg/dL — ABNORMAL HIGH (ref 65–99)
POTASSIUM: 4.2 mmol/L (ref 3.5–5.1)
SODIUM: 144 mmol/L (ref 135–145)
Total Bilirubin: 0.9 mg/dL (ref 0.3–1.2)
Total Protein: 7.7 g/dL (ref 6.5–8.1)

## 2018-04-15 MED ORDER — CEPHALEXIN 500 MG PO CAPS: 500.0000 mg | ORAL_CAPSULE | Freq: Four times a day (QID) | ORAL | 0 refills | Status: DC

## 2018-04-15 MED ORDER — SODIUM CHLORIDE 0.9 % IV SOLN
1.0000 g | Freq: Once | INTRAVENOUS | Status: AC
  Administered 2018-04-15: 1 g via INTRAVENOUS
  Filled 2018-04-15: qty 10

## 2018-04-15 MED ORDER — ONDANSETRON HCL 4 MG/2ML IJ SOLN
4.0000 mg | Freq: Once | INTRAMUSCULAR | Status: AC
  Administered 2018-04-15: 4 mg via INTRAVENOUS
  Filled 2018-04-15: qty 2

## 2018-04-15 MED ORDER — ONDANSETRON 4 MG PO TBDP: ORAL_TABLET | ORAL | 0 refills | Status: DC

## 2018-04-15 MED ORDER — HYDROMORPHONE HCL 1 MG/ML IJ SOLN
0.5000 mg | Freq: Once | INTRAMUSCULAR | Status: AC
  Administered 2018-04-15: 0.5 mg via INTRAVENOUS
  Filled 2018-04-15: qty 1

## 2018-04-15 MED ORDER — KETOROLAC TROMETHAMINE 30 MG/ML IJ SOLN
15.0000 mg | Freq: Once | INTRAMUSCULAR | Status: AC
  Administered 2018-04-15: 15 mg via INTRAVENOUS
  Filled 2018-04-15: qty 1

## 2018-04-15 MED ORDER — TAMSULOSIN HCL 0.4 MG PO CAPS: 0.4000 mg | ORAL_CAPSULE | Freq: Every day | ORAL | 0 refills | Status: DC

## 2018-04-15 MED ORDER — OXYCODONE-ACETAMINOPHEN 5-325 MG PO TABS: 1.0000 | ORAL_TABLET | Freq: Four times a day (QID) | ORAL | 0 refills | Status: DC | PRN

## 2018-04-15 NOTE — ED Triage Notes (Signed)
Pain in llq and lt flank since yesterday evening, has gotten worse today.  Hx of kidney stones.

## 2018-04-15 NOTE — Discharge Instructions (Addendum)
Call [facility] tomorrow to make an appointment to be seen next week.  If you have problems over the weekend you should go to [facility]

## 2018-04-16 ENCOUNTER — Telehealth: Payer: Self-pay

## 2018-04-16 NOTE — Telephone Encounter (Signed)
Noted  

## 2018-04-16 NOTE — ED Provider Notes (Signed)
Pharmacy unable to fill rx for percocet.  Rx not signed.  I rewrote Rx for percocet3/325 #20   Sidney Ace 04/16/18 1021    Milton Ferguson, MD 04/16/18 1311

## 2018-04-16 NOTE — Telephone Encounter (Signed)
[name] called to let Dr. Andria Frames know that Christopher Weeks has kidney stones. He went to ED last night. Ottis Stain, CMA

## 2018-04-17 LAB — URINE CULTURE: CULTURE: NO GROWTH

## 2018-04-20 NOTE — ED Provider Notes (Signed)
[facility] EMERGENCY DEPARTMENT Provider Note   CSN: [id number] Arrival date & time: 04/15/18  1739     History   Chief Complaint Chief Complaint  Patient presents with  . Flank Pain    HPI Christopher Weeks is a 67 y.o. male.  Patient complains of severe left flank pain.  The history is provided by the patient. No language interpreter was used.  Flank Pain  This is a new problem. The current episode started 12 to 24 hours ago. The problem occurs constantly. The problem has not changed since onset.Pertinent negatives include no chest pain, no abdominal pain and no headaches. Nothing aggravates the symptoms. Nothing relieves the symptoms.    Past Medical History:  Diagnosis Date  . Dysrhythmia   . Heart murmur   . Hypertension   . Kidney stones   . Parkinson disease ([place])   . Parkinson's disease [facility])     Patient Active Problem List   Diagnosis Date Noted  . Hematuria 12/24/2017  . Preventative health care 04/08/2017  . Depression, major, single episode 07/11/2015  . Gastroesophageal reflux disease without esophagitis 01/24/2015  . Colon cancer screening 05/24/2014  . Pre-diabetes 08/06/2012  . CAD s/p DES RCA (07/2012 - Promus Element 3.0x32) 07/13/2012  . Parkinson's disease ([place]) 01/14/2012  . Essential hypertension, benign 03/14/2009  . Hyperlipidemia 01/07/2007  . IMPOTENCE, ORGANIC 01/07/2007    Past Surgical History:  Procedure Laterality Date  . anal Energy manager    . CARDIAC CATHETERIZATION  07/11/2012   high graded stenoses small ift circ and small 1st diag   which were  left for medicl therapy, lv systoilc fx preserved  . CORONARY ANGIOPLASTY  07/11/2012   90% and 70% lesions RCA  3.0 x32 mm Promus DES  . CORONARY STENT PLACEMENT  07/11/12  . LEFT HEART CATH N/A 07/11/2012   Procedure: LEFT HEART CATH;  Surgeon: Sanda Klein, MD;  Location: [place] CATH LAB;  Service: Cardiovascular;  Laterality: N/A;  . PERCUTANEOUS CORONARY STENT INTERVENTION (PCI-S) Right  07/11/2012   Procedure: PERCUTANEOUS CORONARY STENT INTERVENTION (PCI-S);  Surgeon: Sanda Klein, MD;  Location: [facility] CATH LAB;  Service: Cardiovascular;  Laterality: Right;        Home Medications    Prior to Admission medications   Medication Sig Start Date End Date Taking? Authorizing Provider  aspirin 81 MG tablet Take 81 mg by mouth at bedtime.    Yes [provider]  atorvastatin (LIPITOR) 80 MG tablet TAKE (1) TABLET BY MOUTH AT BEDTIME. 09/28/17  Yes Hensel, Jamal Collin, MD  Calcium-Vitamin D 600-125 MG-UNIT TABS Take 1 tablet by mouth daily.    Yes [provider]  carbidopa-levodopa (SINEMET IR) 25-100 MG tablet TAKE 2 TABLETS BY MOUTH THREE TIMES DAILY. 10/15/17  Yes Hensel, Jamal Collin, MD  citalopram (CELEXA) 40 MG tablet Take 1 tablet (40 mg total) by mouth daily. 01/07/18  Yes Hensel, Jamal Collin, MD  isosorbide mononitrate (IMDUR) 30 MG 24 hr tablet Take 1 tablet (30 mg total) by mouth daily. MUST KEEP APPOINTMENT 05/21/2018 WITH DR CROITORU FOR FUTURE REFILLS 03/18/18  Yes Croitoru, Mihai, MD  lisinopril (PRINIVIL,ZESTRIL) 20 MG tablet TAKE (1) TABLET BY MOUTH ONCE DAILY. 11/16/17  Yes Hensel, Jamal Collin, MD  nitroGLYCERIN (NITROLINGUAL) 0.4 MG/SPRAY spray Place 1 spray under the tongue every 5 (five) minutes x 3 doses as needed for chest pain. 02/08/16  Yes Croitoru, Mihai, MD  ranitidine (ZANTAC) 150 MG capsule Take 150 mg by mouth 2 (two) times  daily.   Yes [provider]  cephALEXin (KEFLEX) 500 MG capsule Take 1 capsule (500 mg total) by mouth 4 (four) times daily. 04/15/18   Milton Ferguson, MD  ondansetron (ZOFRAN ODT) 4 MG disintegrating tablet 4mg  ODT q4 hours prn nausea/vomit 04/15/18   Milton Ferguson, MD  oxyCODONE-acetaminophen (PERCOCET/ROXICET) 5-325 MG tablet Take 1 tablet by mouth every 6 (six) hours as needed. 04/15/18   Milton Ferguson, MD  tamsulosin (FLOMAX) 0.4 MG CAPS capsule Take 1 capsule (0.4 mg total) by mouth daily. 04/15/18   Milton Ferguson, MD     Family History Family History  Adopted: Yes    Social History Social History   Tobacco Use  . Smoking status: Former Smoker    Packs/day: 1.00    Years: 20.00    Pack years: 20.00    Types: Cigarettes    Last attempt to quit: 11/10/1996    Years since quitting: 21.4  . Smokeless tobacco: Never Used  Substance Use Topics  . Alcohol use: No    Alcohol/week: 0.0 oz  . Drug use: Yes    Types: Marijuana    Comment: Has a few puffs 3-4x a day.      Allergies   Codeine   Review of Systems Review of Systems  Constitutional: Negative for appetite change and fatigue.  HENT: Negative for congestion, ear discharge and sinus pressure.   Eyes: Negative for discharge.  Respiratory: Negative for cough.   Cardiovascular: Negative for chest pain.  Gastrointestinal: Negative for abdominal pain and diarrhea.  Genitourinary: Positive for flank pain. Negative for frequency and hematuria.  Musculoskeletal: Negative for back pain.  Skin: Negative for rash.  Neurological: Negative for seizures and headaches.  Psychiatric/Behavioral: Negative for hallucinations.     Physical Exam Updated Vital Signs BP (!) 121/96   Pulse (!) 59   Temp (!) 97.5 F (36.4 C) (Oral)   Resp 17   Ht 6' (1.829 m)   Wt 93.9 kg (207 lb)   SpO2 96%   BMI 28.07 kg/m   Physical Exam  Constitutional: He is oriented to person, place, and time. He appears well-developed.  HENT:  Head: Normocephalic.  Eyes: Conjunctivae and EOM are normal. No scleral icterus.  Neck: Neck supple. No thyromegaly present.  Cardiovascular: Normal rate and regular rhythm. Exam reveals no gallop and no friction rub.  No murmur heard. Pulmonary/Chest: No stridor. He has no wheezes. He has no rales. He exhibits no tenderness.  Abdominal: He exhibits no distension. There is no tenderness. There is no rebound.  Genitourinary:  Genitourinary Comments: Tender left flank  Musculoskeletal: Normal range of motion. He exhibits no  edema.  Lymphadenopathy:    He has no cervical adenopathy.  Neurological: He is oriented to person, place, and time. He exhibits normal muscle tone. Coordination normal.  Skin: No rash noted. No erythema.  Psychiatric: He has a normal mood and affect. His behavior is normal.     ED Treatments / Results  Labs (all labs ordered are listed, but only abnormal results are displayed) Labs Reviewed  CBC WITH DIFFERENTIAL/PLATELET - Abnormal; Notable for the following components:      Result Value   WBC 11.6 (*)    Neutro Abs 9.4 (*)    All other components within normal limits  COMPREHENSIVE METABOLIC PANEL - Abnormal; Notable for the following components:   Glucose, Bld 149 (*)    ALT 8 (*)    GFR calc non Af Amer 60 (*)  All other components within normal limits  URINALYSIS, ROUTINE W REFLEX MICROSCOPIC - Abnormal; Notable for the following components:   APPearance HAZY (*)    Hgb urine dipstick MODERATE (*)    Ketones, ur 5 (*)    Protein, ur 30 (*)    Leukocytes, UA LARGE (*)    Bacteria, UA RARE (*)    All other components within normal limits  URINE CULTURE    EKG None  Radiology No results found.  Procedures Procedures (including critical care time)  Medications Ordered in ED Medications  ketorolac (TORADOL) 30 MG/ML injection 15 mg (15 mg Intravenous Given 04/15/18 1818)  ondansetron (ZOFRAN) injection 4 mg (4 mg Intravenous Given 04/15/18 1819)  HYDROmorphone (DILAUDID) injection 0.5 mg (0.5 mg Intravenous Given 04/15/18 1819)  cefTRIAXone (ROCEPHIN) 1 g in sodium chloride 0.9 % 100 mL IVPB (0 g Intravenous Stopped 04/15/18 2229)     Initial Impression / Assessment and Plan / ED Course  I have reviewed the triage vital signs and the nursing notes.  Pertinent labs & imaging results that were available during my care of the patient were reviewed by me and considered in my medical decision making (see chart for details).     Patient with severe left flank pain.  He  will follow-up with urology beginning of the week  Final Clinical Impressions(s) / ED Diagnoses   Final diagnoses:  Flank pain    ED Discharge Orders        Ordered    cephALEXin (KEFLEX) 500 MG capsule  4 times daily     04/15/18 2144    ondansetron (ZOFRAN ODT) 4 MG disintegrating tablet     04/15/18 2144    oxyCODONE-acetaminophen (PERCOCET/ROXICET) 5-325 MG tablet  Every 6 hours PRN     04/15/18 2144    tamsulosin (FLOMAX) 0.4 MG CAPS capsule  Daily     04/15/18 2144       Milton Ferguson, MD 04/20/18 1131

## 2018-04-21 ENCOUNTER — Encounter (HOSPITAL_COMMUNITY): Payer: Self-pay | Admitting: General Practice

## 2018-04-21 ENCOUNTER — Encounter

## 2018-04-21 ENCOUNTER — Other Ambulatory Visit: Payer: Self-pay | Admitting: Urology

## 2018-04-21 ENCOUNTER — Ambulatory Visit (INDEPENDENT_AMBULATORY_CARE_PROVIDER_SITE_OTHER): Payer: PPO | Admitting: Urology

## 2018-04-21 DIAGNOSIS — N202 Calculus of kidney with calculus of ureter: Secondary | ICD-10-CM

## 2018-04-22 ENCOUNTER — Ambulatory Visit (HOSPITAL_COMMUNITY)
Admission: RE | Admit: 2018-04-22 | Discharge: 2018-04-22 | Disposition: A | Discharge status: Home / Self Care | Payer: PPO | Source: Ambulatory Visit | Attending: Urology | Admitting: Urology

## 2018-04-22 ENCOUNTER — Encounter (HOSPITAL_COMMUNITY): Payer: Self-pay | Admitting: General Practice

## 2018-04-22 ENCOUNTER — Encounter (HOSPITAL_COMMUNITY)
Admission: RE | Disposition: A | Discharge status: Home / Self Care | Payer: Self-pay | Source: Ambulatory Visit | Attending: Urology

## 2018-04-22 ENCOUNTER — Ambulatory Visit (HOSPITAL_COMMUNITY): Payer: PPO

## 2018-04-22 DIAGNOSIS — N201 Calculus of ureter: Secondary | ICD-10-CM | POA: Insufficient documentation

## 2018-04-22 DIAGNOSIS — Z7982 Long term (current) use of aspirin: Secondary | ICD-10-CM | POA: Diagnosis not present

## 2018-04-22 DIAGNOSIS — K219 Gastro-esophageal reflux disease without esophagitis: Secondary | ICD-10-CM | POA: Insufficient documentation

## 2018-04-22 DIAGNOSIS — I10 Essential (primary) hypertension: Secondary | ICD-10-CM | POA: Insufficient documentation

## 2018-04-22 DIAGNOSIS — Z87442 Personal history of urinary calculi: Secondary | ICD-10-CM | POA: Insufficient documentation

## 2018-04-22 DIAGNOSIS — Z79899 Other long term (current) drug therapy: Secondary | ICD-10-CM | POA: Insufficient documentation

## 2018-04-22 DIAGNOSIS — G20 Parkinson's disease: Secondary | ICD-10-CM | POA: Diagnosis not present

## 2018-04-22 DIAGNOSIS — N202 Calculus of kidney with calculus of ureter: Secondary | ICD-10-CM | POA: Diagnosis not present

## 2018-04-22 DIAGNOSIS — I252 Old myocardial infarction: Secondary | ICD-10-CM | POA: Diagnosis not present

## 2018-04-22 DIAGNOSIS — E785 Hyperlipidemia, unspecified: Secondary | ICD-10-CM | POA: Insufficient documentation

## 2018-04-22 HISTORY — DX: Major depressive disorder, single episode, unspecified: F32.9

## 2018-04-22 HISTORY — DX: Personal history of colon polyps, unspecified: Z86.0100

## 2018-04-22 HISTORY — DX: Acute myocardial infarction, unspecified: I21.9

## 2018-04-22 HISTORY — DX: Depression, unspecified: F32.A

## 2018-04-22 HISTORY — DX: Personal history of colonic polyps: Z86.010

## 2018-04-22 HISTORY — DX: Personal history of urinary calculi: Z87.442

## 2018-04-22 HISTORY — DX: Anxiety disorder, unspecified: F41.9

## 2018-04-22 HISTORY — PX: EXTRACORPOREAL SHOCK WAVE LITHOTRIPSY: SHX1557

## 2018-04-22 HISTORY — DX: Gastro-esophageal reflux disease without esophagitis: K21.9

## 2018-04-22 SURGERY — LITHOTRIPSY, ESWL
Anesthesia: LOCAL | Laterality: Left

## 2018-04-22 MED ORDER — CIPROFLOXACIN HCL 500 MG PO TABS
500.0000 mg | ORAL_TABLET | ORAL | Status: AC
  Administered 2018-04-22: 500 mg via ORAL
  Filled 2018-04-22: qty 1

## 2018-04-22 MED ORDER — DIAZEPAM 5 MG PO TABS
10.0000 mg | ORAL_TABLET | ORAL | Status: AC
  Administered 2018-04-22: 10 mg via ORAL
  Filled 2018-04-22: qty 2

## 2018-04-22 MED ORDER — DIPHENHYDRAMINE HCL 25 MG PO CAPS
25.0000 mg | ORAL_CAPSULE | ORAL | Status: AC
  Administered 2018-04-22: 25 mg via ORAL
  Filled 2018-04-22: qty 1

## 2018-04-22 MED ORDER — SODIUM CHLORIDE 0.9 % IV SOLN
INTRAVENOUS | Status: DC
  Administered 2018-04-22: 11:00:00 via INTRAVENOUS

## 2018-04-23 ENCOUNTER — Encounter (HOSPITAL_COMMUNITY): Payer: Self-pay | Admitting: Urology

## 2018-05-05 ENCOUNTER — Ambulatory Visit (HOSPITAL_COMMUNITY)
Admission: RE | Admit: 2018-05-05 | Discharge: 2018-05-05 | Disposition: A | Discharge status: Home / Self Care | Payer: PPO | Source: Ambulatory Visit | Attending: Urology | Admitting: Urology

## 2018-05-05 ENCOUNTER — Other Ambulatory Visit: Payer: Self-pay | Admitting: Urology

## 2018-05-05 ENCOUNTER — Ambulatory Visit (INDEPENDENT_AMBULATORY_CARE_PROVIDER_SITE_OTHER): Payer: PPO | Admitting: Urology

## 2018-05-05 DIAGNOSIS — N202 Calculus of kidney with calculus of ureter: Secondary | ICD-10-CM | POA: Insufficient documentation

## 2018-05-11 ENCOUNTER — Encounter: Payer: Self-pay | Admitting: Family Medicine

## 2018-05-12 ENCOUNTER — Other Ambulatory Visit: Payer: Self-pay | Admitting: Urology

## 2018-05-12 ENCOUNTER — Ambulatory Visit (INDEPENDENT_AMBULATORY_CARE_PROVIDER_SITE_OTHER): Payer: PPO | Admitting: Urology

## 2018-05-12 ENCOUNTER — Emergency Department (HOSPITAL_COMMUNITY): Admission: EM | Admit: 2018-05-12 | Discharge: 2018-05-12 | Discharge status: Absent without leave | Payer: PPO

## 2018-05-12 ENCOUNTER — Ambulatory Visit (HOSPITAL_COMMUNITY)
Admission: RE | Admit: 2018-05-12 | Discharge: 2018-05-12 | Disposition: A | Discharge status: Home / Self Care | Payer: PPO | Source: Ambulatory Visit | Attending: Urology | Admitting: Urology

## 2018-05-12 DIAGNOSIS — N202 Calculus of kidney with calculus of ureter: Secondary | ICD-10-CM | POA: Insufficient documentation

## 2018-05-21 ENCOUNTER — Ambulatory Visit: Payer: PPO | Admitting: Cardiovascular Disease

## 2018-05-26 ENCOUNTER — Ambulatory Visit (HOSPITAL_COMMUNITY)
Admission: RE | Admit: 2018-05-26 | Discharge: 2018-05-26 | Disposition: A | Discharge status: Home / Self Care | Payer: PPO | Source: Ambulatory Visit | Attending: Urology | Admitting: Urology

## 2018-05-26 ENCOUNTER — Other Ambulatory Visit: Payer: Self-pay | Admitting: Urology

## 2018-05-26 ENCOUNTER — Ambulatory Visit (INDEPENDENT_AMBULATORY_CARE_PROVIDER_SITE_OTHER): Payer: PPO | Admitting: Urology

## 2018-05-26 DIAGNOSIS — N202 Calculus of kidney with calculus of ureter: Secondary | ICD-10-CM

## 2018-05-31 ENCOUNTER — Encounter (HOSPITAL_COMMUNITY): Payer: Self-pay | Admitting: General Practice

## 2018-05-31 ENCOUNTER — Ambulatory Visit (HOSPITAL_COMMUNITY)
Admission: RE | Admit: 2018-05-31 | Discharge: 2018-05-31 | Disposition: A | Discharge status: Home / Self Care | Payer: PPO | Source: Ambulatory Visit | Attending: Urology | Admitting: Urology

## 2018-05-31 ENCOUNTER — Encounter (HOSPITAL_COMMUNITY)
Admission: RE | Disposition: A | Discharge status: Home / Self Care | Payer: Self-pay | Source: Ambulatory Visit | Attending: Urology

## 2018-05-31 ENCOUNTER — Ambulatory Visit (HOSPITAL_COMMUNITY): Payer: PPO

## 2018-05-31 DIAGNOSIS — R011 Cardiac murmur, unspecified: Secondary | ICD-10-CM | POA: Insufficient documentation

## 2018-05-31 DIAGNOSIS — N201 Calculus of ureter: Secondary | ICD-10-CM | POA: Insufficient documentation

## 2018-05-31 DIAGNOSIS — Z87891 Personal history of nicotine dependence: Secondary | ICD-10-CM | POA: Insufficient documentation

## 2018-05-31 DIAGNOSIS — Z955 Presence of coronary angioplasty implant and graft: Secondary | ICD-10-CM | POA: Insufficient documentation

## 2018-05-31 DIAGNOSIS — Z79899 Other long term (current) drug therapy: Secondary | ICD-10-CM | POA: Diagnosis not present

## 2018-05-31 DIAGNOSIS — F329 Major depressive disorder, single episode, unspecified: Secondary | ICD-10-CM | POA: Insufficient documentation

## 2018-05-31 DIAGNOSIS — Z7982 Long term (current) use of aspirin: Secondary | ICD-10-CM | POA: Diagnosis not present

## 2018-05-31 DIAGNOSIS — Z86010 Personal history of colonic polyps: Secondary | ICD-10-CM | POA: Insufficient documentation

## 2018-05-31 DIAGNOSIS — I209 Angina pectoris, unspecified: Secondary | ICD-10-CM | POA: Diagnosis not present

## 2018-05-31 DIAGNOSIS — I10 Essential (primary) hypertension: Secondary | ICD-10-CM | POA: Insufficient documentation

## 2018-05-31 DIAGNOSIS — Z87442 Personal history of urinary calculi: Secondary | ICD-10-CM | POA: Insufficient documentation

## 2018-05-31 DIAGNOSIS — I499 Cardiac arrhythmia, unspecified: Secondary | ICD-10-CM | POA: Insufficient documentation

## 2018-05-31 DIAGNOSIS — N200 Calculus of kidney: Secondary | ICD-10-CM | POA: Diagnosis not present

## 2018-05-31 DIAGNOSIS — G20 Parkinson's disease: Secondary | ICD-10-CM | POA: Diagnosis not present

## 2018-05-31 DIAGNOSIS — F419 Anxiety disorder, unspecified: Secondary | ICD-10-CM | POA: Insufficient documentation

## 2018-05-31 DIAGNOSIS — Z885 Allergy status to narcotic agent status: Secondary | ICD-10-CM | POA: Diagnosis not present

## 2018-05-31 DIAGNOSIS — K219 Gastro-esophageal reflux disease without esophagitis: Secondary | ICD-10-CM | POA: Insufficient documentation

## 2018-05-31 DIAGNOSIS — I252 Old myocardial infarction: Secondary | ICD-10-CM | POA: Insufficient documentation

## 2018-05-31 HISTORY — PX: EXTRACORPOREAL SHOCK WAVE LITHOTRIPSY: SHX1557

## 2018-05-31 SURGERY — LITHOTRIPSY, ESWL
Anesthesia: LOCAL | Laterality: Left

## 2018-05-31 MED ORDER — SODIUM CHLORIDE 0.9 % IV SOLN
INTRAVENOUS | Status: DC
  Administered 2018-05-31: 07:00:00 via INTRAVENOUS

## 2018-05-31 MED ORDER — CIPROFLOXACIN HCL 500 MG PO TABS
500.0000 mg | ORAL_TABLET | ORAL | Status: AC
  Administered 2018-05-31: 500 mg via ORAL
  Filled 2018-05-31: qty 1

## 2018-05-31 MED ORDER — DIPHENHYDRAMINE HCL 25 MG PO CAPS
25.0000 mg | ORAL_CAPSULE | ORAL | Status: AC
  Administered 2018-05-31: 25 mg via ORAL
  Filled 2018-05-31: qty 1

## 2018-05-31 MED ORDER — DIAZEPAM 5 MG PO TABS
10.0000 mg | ORAL_TABLET | ORAL | Status: AC
  Administered 2018-05-31: 10 mg via ORAL
  Filled 2018-05-31: qty 2

## 2018-05-31 NOTE — H&P (Signed)
Christopher Weeks is an 67 y.o. male.    Chief Complaint: Pre-Op LEFT Shockwave Lithotripsy  HPI:   1 - LEFT Distal Ureteral Stone - 38mm left distal conglomerate of stones by CT 04/2018, passed several fragments, but 34mm UVJ stone remains. Most recent UA without infectious parameters.  Today Christopher Weeks is seen for LEFT shockwave lithotripsy for remaining distal stone. No interval fevers.   Past Medical History:  Diagnosis Date  . Anxiety   . Depression   . Dysrhythmia   . GERD (gastroesophageal reflux disease)   . Heart murmur   . History of colon polyps   . History of kidney stones   . Hypertension   . Kidney stones   . Myocardial infarction ([place])   . Parkinson disease ([place])   . Parkinson's disease [facility])     Past Surgical History:  Procedure Laterality Date  . anal Energy manager    . CARDIAC CATHETERIZATION  07/11/2012   high graded stenoses small ift circ and small 1st diag   which were  left for medicl therapy, lv systoilc fx preserved  . CORONARY ANGIOPLASTY  07/11/2012   90% and 70% lesions RCA  3.0 x32 mm Promus DES  . CORONARY STENT PLACEMENT  07/11/12  . EXTRACORPOREAL SHOCK WAVE LITHOTRIPSY Left 04/22/2018   Procedure: LEFT EXTRACORPOREAL SHOCK WAVE LITHOTRIPSY (ESWL);  Surgeon: Cleon Gustin, MD;  Location: [facility];  Service: Urology;  Laterality: Left;  . LEFT HEART CATH N/A 07/11/2012   Procedure: LEFT HEART CATH;  Surgeon: Sanda Klein, MD;  Location: [place] CATH LAB;  Service: Cardiovascular;  Laterality: N/A;  . PERCUTANEOUS CORONARY STENT INTERVENTION (PCI-S) Right 07/11/2012   Procedure: PERCUTANEOUS CORONARY STENT INTERVENTION (PCI-S);  Surgeon: Sanda Klein, MD;  Location: [facility] CATH LAB;  Service: Cardiovascular;  Laterality: Right;    Family History  Adopted: Yes   Social History:  reports that he quit smoking about 21 years ago. His smoking use included cigarettes. He has a 20.00 pack-year smoking history. He has never used smokeless tobacco. He reports that he has  current or past drug history. Drug: Marijuana. He reports that he does not drink alcohol.  Allergies:  Allergies  Allergen Reactions  . Codeine Hives    Denies Airway involvement    Medications Prior to Admission  Medication Sig Dispense Refill  . atorvastatin (LIPITOR) 80 MG tablet TAKE (1) TABLET BY MOUTH AT BEDTIME. 90 tablet 3  . carbidopa-levodopa (SINEMET IR) 25-100 MG tablet TAKE 2 TABLETS BY MOUTH THREE TIMES DAILY. 180 tablet 12  . citalopram (CELEXA) 40 MG tablet Take 1 tablet (40 mg total) by mouth daily. 90 tablet 3  . isosorbide mononitrate (IMDUR) 30 MG 24 hr tablet Take 1 tablet (30 mg total) by mouth daily. MUST KEEP APPOINTMENT 05/21/2018 WITH DR CROITORU FOR FUTURE REFILLS 30 tablet 2  . lisinopril (PRINIVIL,ZESTRIL) 20 MG tablet TAKE (1) TABLET BY MOUTH ONCE DAILY. 30 tablet 12  . ranitidine (ZANTAC) 150 MG capsule Take 150 mg by mouth 2 (two) times daily.    Marland Kitchen aspirin 81 MG tablet Take 81 mg by mouth at bedtime.     . Calcium-Vitamin D 600-125 MG-UNIT TABS Take 1 tablet by mouth daily.     . cephALEXin (KEFLEX) 500 MG capsule Take 1 capsule (500 mg total) by mouth 4 (four) times daily. 28 capsule 0  . nitroGLYCERIN (NITROLINGUAL) 0.4 MG/SPRAY spray Place 1 spray under the tongue every 5 (five) minutes x 3 doses as needed for chest  pain. 12 g 3  . ondansetron (ZOFRAN ODT) 4 MG disintegrating tablet 4mg  ODT q4 hours prn nausea/vomit 12 tablet 0  . oxyCODONE-acetaminophen (PERCOCET/ROXICET) 5-325 MG tablet Take 1 tablet by mouth every 6 (six) hours as needed. 20 tablet 0  . tamsulosin (FLOMAX) 0.4 MG CAPS capsule Take 1 capsule (0.4 mg total) by mouth daily. 6 capsule 0    No results found for this or any previous visit (from the past 48 hour(s)). No results found.  Review of Systems  Constitutional: Negative.  Negative for chills and fever.  HENT: Negative.   Eyes: Negative.   Respiratory: Negative.   Cardiovascular: Negative.   Gastrointestinal: Negative.    Genitourinary: Positive for flank pain.  Skin: Negative.   Neurological: Negative.   Endo/Heme/Allergies: Negative.   Psychiatric/Behavioral: Negative.     Blood pressure (!) 170/90, pulse 64, temperature 97.9 F (36.6 C), temperature source Oral, resp. rate 16, height 6' (1.829 m), weight 94.8 kg (209 lb), SpO2 97 %. Physical Exam   Assessment/Plan  1 - LEFT Distal Ureteral Stone - proceed as planned with LEFT shockwave lithotripsy. Risks, benefits, alternaitves, expected peri-procedure course discussed previously and reiterated today.   Alexis Frock, MD 05/31/2018, 7:54 AM

## 2018-05-31 NOTE — Brief Op Note (Signed)
05/31/2018  10:00 AM  PATIENT:  Christopher Weeks  67 y.o. male  PRE-OPERATIVE DIAGNOSIS:  LEFT URETERAL CALCULUS  POST-OPERATIVE DIAGNOSIS:  * No post-op diagnosis entered *  PROCEDURE:  Procedure(s) with comments: LEFT EXTRACORPOREAL SHOCK WAVE LITHOTRIPSY (ESWL) (Left) - [phone] Jed Limerick [id number]  SURGEON:  Surgeon(s) and Role:    * Alexis Frock, MD - Primary  PHYSICIAN ASSISTANT:   ASSISTANTS: none   ANESTHESIA:   MAC  EBL:  none   BLOOD ADMINISTERED:none  DRAINS: none   LOCAL MEDICATIONS USED:  NONE  SPECIMEN:  No Specimen  DISPOSITION OF SPECIMEN:  N/A  COUNTS:  YES  TOURNIQUET:  * No tourniquets in log *  DICTATION: .Note written in paper chart  PLAN OF CARE: Discharge to home after PACU  PATIENT DISPOSITION:  Short Stay   Delay start of Pharmacological VTE agent (>24hrs) due to surgical blood loss or risk of bleeding: not applicable

## 2018-05-31 NOTE — Discharge Instructions (Signed)
1 - You may have urinary urgency (bladder spasms), pass small stone fragments,  and bloody urine on / off for about 2 weeks. This is normal.  2 - Call MD or go to ER for fever >102, severe pain / nausea / vomiting not relieved by medications, or acute change in medical status

## 2018-06-16 ENCOUNTER — Other Ambulatory Visit (HOSPITAL_COMMUNITY)
Admission: RE | Admit: 2018-06-16 | Discharge: 2018-06-16 | Disposition: A | Discharge status: Home / Self Care | Payer: PPO | Source: Ambulatory Visit | Attending: Urology | Admitting: Urology

## 2018-06-16 ENCOUNTER — Other Ambulatory Visit: Payer: Self-pay | Admitting: Urology

## 2018-06-16 ENCOUNTER — Ambulatory Visit (INDEPENDENT_AMBULATORY_CARE_PROVIDER_SITE_OTHER): Payer: PPO | Admitting: Urology

## 2018-06-16 ENCOUNTER — Ambulatory Visit (HOSPITAL_COMMUNITY)
Admission: RE | Admit: 2018-06-16 | Discharge: 2018-06-16 | Disposition: A | Discharge status: Home / Self Care | Payer: PPO | Source: Ambulatory Visit | Attending: Urology | Admitting: Urology

## 2018-06-16 DIAGNOSIS — N200 Calculus of kidney: Secondary | ICD-10-CM | POA: Insufficient documentation

## 2018-06-16 DIAGNOSIS — N202 Calculus of kidney with calculus of ureter: Secondary | ICD-10-CM

## 2018-06-22 LAB — STONE ANALYSIS
CA OXALATE, MONOHYDR.: 45 %
CA PHOS CRY STONE QL IR: 25 %
Ca Oxalate,Dihydrate: 30 %
STONE WEIGHT KSTONE: 132.2 mg

## 2018-06-28 ENCOUNTER — Other Ambulatory Visit: Payer: Self-pay | Admitting: Cardiovascular Disease

## 2018-07-09 ENCOUNTER — Other Ambulatory Visit: Payer: Self-pay | Admitting: Urology

## 2018-07-09 DIAGNOSIS — N202 Calculus of kidney with calculus of ureter: Secondary | ICD-10-CM

## 2018-07-27 ENCOUNTER — Ambulatory Visit (HOSPITAL_COMMUNITY): Admission: RE | Admit: 2018-07-27 | Payer: PPO | Source: Ambulatory Visit

## 2018-08-12 ENCOUNTER — Telehealth: Payer: Self-pay | Admitting: Family Medicine

## 2018-08-12 ENCOUNTER — Encounter: Payer: Self-pay | Admitting: Cardiovascular Disease

## 2018-08-12 ENCOUNTER — Ambulatory Visit (INDEPENDENT_AMBULATORY_CARE_PROVIDER_SITE_OTHER): Payer: PPO | Admitting: Cardiovascular Disease

## 2018-08-12 VITALS — BP 142/80 | HR 54 | Ht 72.0 in | Wt 201.8 lb

## 2018-08-12 DIAGNOSIS — E785 Hyperlipidemia, unspecified: Secondary | ICD-10-CM

## 2018-08-12 DIAGNOSIS — I10 Essential (primary) hypertension: Secondary | ICD-10-CM

## 2018-08-12 DIAGNOSIS — R7303 Prediabetes: Secondary | ICD-10-CM | POA: Diagnosis not present

## 2018-08-12 DIAGNOSIS — R001 Bradycardia, unspecified: Secondary | ICD-10-CM | POA: Diagnosis not present

## 2018-08-12 DIAGNOSIS — I25118 Atherosclerotic heart disease of native coronary artery with other forms of angina pectoris: Secondary | ICD-10-CM | POA: Diagnosis not present

## 2018-08-12 MED ORDER — ISOSORBIDE MONONITRATE ER 60 MG PO TB24: 60.0000 mg | ORAL_TABLET | Freq: Every day | ORAL | 3 refills | Status: DC

## 2018-08-12 NOTE — Progress Notes (Signed)
Patient ID: Christopher Weeks, male   DOB: 09/10/1951, 67 y.o.   MRN: [id number] Patient ID: Christopher Weeks, male   DOB: [birth year], 67 y.o.   MRN: [id number]    Cardiology Office Note    Date:  08/13/2018   ID:  Christopher Weeks, DOB 1951-08-11, MRN [id number]  PCP:  Zenia Resides, MD  Cardiologist:    Sanda Klein, MD   Chief Complaint  Patient presents with  . Chest Pain    History of Present Illness:  Christopher Weeks is a 67 y.o. male with a history of unstable angina leading to placement of a drug-eluting stent in the right coronary artery in September 2013, without subsequent coronary events. Additional comorbid conditions include hyperlipidemia, prediabetes, essential hypertension and Parkinson's disease.  Discontinuation of beta-blockers led to improvement in his bradycardia and adjustment of his Parkinson's meds has led to improved functional status and better activity level.  However he has subsequently developed some exertional angina.  He has to stop halfway when he mows his yard and take a rest because of chest pressure.  The symptoms resolve in several minutes and then he is able to finish his task.  He has not had problems with shortness of breath, dizziness, palpitations, syncope or new focal neurological complaints.  He does not have headaches.  He has tremor from Parkinson's disease.  He underwent a couple of sessions of lithotripsy for nephrolithiasis with Dr. Vinetta Bergamo in [place].  His nuclear stress test in April 2017 was a low risk study and EF was 60%.  He is taking aspirin and maximum dose atorvastatin.   Past Medical History:  Diagnosis Date  . Anxiety   . Depression   . Dysrhythmia   . GERD (gastroesophageal reflux disease)   . Heart murmur   . History of colon polyps   . History of kidney stones   . Hypertension   . Kidney stones   . Myocardial infarction ([place])   . Parkinson disease ([place])   . Parkinson's disease [facility])     Past Surgical History:  Procedure  Laterality Date  . anal Energy manager    . CARDIAC CATHETERIZATION  07/11/2012   high graded stenoses small ift circ and small 1st diag   which were  left for medicl therapy, lv systoilc fx preserved  . CORONARY ANGIOPLASTY  07/11/2012   90% and 70% lesions RCA  3.0 x32 mm Promus DES  . CORONARY STENT PLACEMENT  07/11/12  . EXTRACORPOREAL SHOCK WAVE LITHOTRIPSY Left 04/22/2018   Procedure: LEFT EXTRACORPOREAL SHOCK WAVE LITHOTRIPSY (ESWL);  Surgeon: Cleon Gustin, MD;  [facility];  Service: Urology;  Laterality: Left;  . LEFT HEART CATH N/A 07/11/2012   Procedure: LEFT HEART CATH;  Surgeon: Sanda Klein, MD;  Location: [place] CATH LAB;  Service: Cardiovascular;  Laterality: N/A;  . PERCUTANEOUS CORONARY STENT INTERVENTION (PCI-S) Right 07/11/2012   Procedure: PERCUTANEOUS CORONARY STENT INTERVENTION (PCI-S);  Surgeon: Sanda Klein, MD;  Location: [facility] CATH LAB;  Service: Cardiovascular;  Laterality: Right;    Current Medications: Outpatient Medications Prior to Visit  Medication Sig Dispense Refill  . aspirin 81 MG tablet Take 81 mg by mouth at bedtime.     Marland Kitchen atorvastatin (LIPITOR) 80 MG tablet TAKE (1) TABLET BY MOUTH AT BEDTIME. 90 tablet 3  . Calcium-Vitamin D 600-125 MG-UNIT TABS Take 1 tablet by mouth daily.     . carbidopa-levodopa (SINEMET IR) 25-100 MG tablet TAKE 2 TABLETS BY MOUTH THREE TIMES DAILY.  180 tablet 12  . cephALEXin (KEFLEX) 500 MG capsule Take 1 capsule (500 mg total) by mouth 4 (four) times daily. 28 capsule 0  . citalopram (CELEXA) 40 MG tablet Take 1 tablet (40 mg total) by mouth daily. 90 tablet 3  . lisinopril (PRINIVIL,ZESTRIL) 20 MG tablet TAKE (1) TABLET BY MOUTH ONCE DAILY. 30 tablet 12  . nitroGLYCERIN (NITROLINGUAL) 0.4 MG/SPRAY spray Place 1 spray under the tongue every 5 (five) minutes x 3 doses as needed for chest pain. 12 g 3  . ondansetron (ZOFRAN ODT) 4 MG disintegrating tablet 4mg  ODT q4 hours prn nausea/vomit 12 tablet 0  . oxyCODONE-acetaminophen  (PERCOCET/ROXICET) 5-325 MG tablet Take 1 tablet by mouth every 6 (six) hours as needed. 20 tablet 0  . ranitidine (ZANTAC) 150 MG capsule Take 150 mg by mouth 2 (two) times daily.    . tamsulosin (FLOMAX) 0.4 MG CAPS capsule Take 1 capsule (0.4 mg total) by mouth daily. 6 capsule 0  . isosorbide mononitrate (IMDUR) 30 MG 24 hr tablet Take 1 tablet (30 mg total) by mouth daily. KEEP OV. 30 tablet 1   No facility-administered medications prior to visit.      Allergies:   Codeine   Social History   Socioeconomic History  . Marital status: Married    Spouse name: Vickie  . Number of children: 2  . Years of education: 82  . Highest education level: Not on file  Occupational History  . Occupation: Disabled-maintenance work  Scientific laboratory technician  . Financial resource strain: Not on file  . Food insecurity:    Worry: Not on file    Inability: Not on file  . Transportation needs:    Medical: Not on file    Non-medical: Not on file  Tobacco Use  . Smoking status: Former Smoker    Packs/day: 1.00    Years: 20.00    Pack years: 20.00    Types: Cigarettes    Last attempt to quit: 11/10/1996    Years since quitting: 21.7  . Smokeless tobacco: Never Used  Substance and Sexual Activity  . Alcohol use: No    Alcohol/week: 0.0 standard drinks  . Drug use: Yes    Types: Marijuana    Comment: not using, uses CBD oil now- 04/21/18  . Sexual activity: Yes    Comment: monogamous  Lifestyle  . Physical activity:    Days per week: Not on file    Minutes per session: Not on file  . Stress: Not on file  Relationships  . Social connections:    Talks on phone: Not on file    Gets together: Not on file    Attends religious service: Not on file    Active member of club or organization: Not on file    Attends meetings of clubs or organizations: Not on file    Relationship status: Not on file  Other Topics Concern  . Not on file  Social History Narrative  . Not on file     Family History:  The  patient's family history is not known. He was adopted.   ROS:   Please see the history of present illness.    ROS all other systems are reviewed and are negative   PHYSICAL EXAM:   VS:  BP (!) 142/80 (BP Location: Left Arm, Patient Position: Sitting, Cuff Size: Normal)   Pulse (!) 54   Ht 6' (1.829 m)   Wt 201 lb 12.8 oz (91.5 kg)   BMI 27.37  kg/m     General: Alert, oriented x3, no distress, appears well Head: no evidence of trauma, PERRL, EOMI, no exophtalmos or lid lag, no myxedema, no xanthelasma; normal ears, nose and oropharynx Neck: normal jugular venous pulsations and no hepatojugular reflux; brisk carotid pulses without delay and no carotid bruits Chest: clear to auscultation, no signs of consolidation by percussion or palpation, normal fremitus, symmetrical and full respiratory excursions Cardiovascular: normal position and quality of the apical impulse, regular rhythm, normal first and second heart sounds, no murmurs, rubs or gallops Abdomen: no tenderness or distention, no masses by palpation, no abnormal pulsatility or arterial bruits, normal bowel sounds, no hepatosplenomegaly Extremities: no clubbing, cyanosis or edema; 2+ radial, ulnar and brachial pulses bilaterally; 2+ right femoral, posterior tibial and dorsalis pedis pulses; 2+ left femoral, posterior tibial and dorsalis pedis pulses; no subclavian or femoral bruits Neurological: grossly nonfocal, symmetrical resting tremor in the fingers Psych: Normal mood and affect   Wt Readings from Last 3 Encounters:  08/12/18 201 lb 12.8 oz (91.5 kg)  05/31/18 209 lb (94.8 kg)  04/22/18 206 lb 3.2 oz (93.5 kg)      Studies/Labs Reviewed:   EKG:  EKG is ordered today.  Shows sinus bradycardia, no ischemic changes, QTC 430 ms  Recent Labs: 04/15/2018: ALT 8; BUN 15; Creatinine, Ser 1.23; Hemoglobin 14.5; Platelets 226; Potassium 4.2; Sodium 144   Lipid Panel    Component Value Date/Time   CHOL 160 04/08/2018 1052    TRIG 161 (H) 04/08/2018 1052   HDL 39 (L) 04/08/2018 1052   CHOLHDL 4.1 04/08/2018 1052   CHOLHDL 4.7 02/12/2016 1000   VLDL 34 (H) 02/12/2016 1000   LDLCALC 89 04/08/2018 1052     ASSESSMENT:    1. Coronary artery disease of native artery of native heart with stable angina pectoris ([place])   2. Sinus bradycardia   3. Dyslipidemia   4. Prediabetes   5. Essential hypertension, benign      PLAN:  In order of problems listed above:  1. CAD: CCS class 1-2 exertional angina pectoris.  Cannot take beta-blockers due to bradycardia.  We will increase his dose of nitrates.  If nitrates alone do not work, may need to add a dihydropyridine calcium channel blocker such as amlodipine.  Discussed the distinction between stable and unstable angina symptoms and gave him detailed instructions when he should seek urgent medical attention or call the office. 2. Bradycardia : Need to avoid beta-blockers or other negative chronotropic medications, such as diltiazem or 3. HLP: We will recheck his lipid profile.  May need to add Zetia or PCSK9 inhibitor. 4. PreDM: He has lost weight and his blood sugar control is improved. 5. HTN: Fair control for him.  Avoid diuretics due to his tendency to have orthostatic hypotension (probably worsened by his Parkinson's disease).  If additional antihypertensives are necessary in the future I think I would prefer amlodipine since it also is anything effects, would continue to avoid diuretics and beta-blockers for    Medication Adjustments/Labs and Tests Ordered: Current medicines are reviewed at length with the patient today.  Concerns regarding medicines are outlined above.  Medication changes, Labs and Tests ordered today are listed in the Patient Instructions below. Patient Instructions  Medication Instructions: Dr Sallyanne Kuster has recommended making the following medication changes: 1. INCREASE Isosorbide to 60 mg daily  Labwork: Your physician recommends that you  return for lab work at your earliest [place].  Testing/Procedures: NONE ORDERED  Follow-up:  Your physician recommends that you schedule a follow-up appointment in 2 months with a NP/PA.  Dr Sallyanne Kuster recommends that you schedule a follow-up appointment in 6 months.  If you need a refill on your cardiac medications before your next appointment, please call your pharmacy.    Signed, Sanda Klein, MD  08/13/2018 3:07 PM    [facility] [address], [place], [place]  [id number] Phone: [phone]; Fax: [phone]

## 2018-08-12 NOTE — Patient Instructions (Signed)
Medication Instructions: Dr Sallyanne Kuster has recommended making the following medication changes: 1. INCREASE Isosorbide to 60 mg daily  Labwork: Your physician recommends that you return for lab work at your earliest [place].  Testing/Procedures: NONE ORDERED  Follow-up: Your physician recommends that you schedule a follow-up appointment in 2 months with a NP/PA.  Dr Sallyanne Kuster recommends that you schedule a follow-up appointment in 6 months.  If you need a refill on your cardiac medications before your next appointment, please call your pharmacy.

## 2018-08-12 NOTE — Telephone Encounter (Signed)
Pt wife called and she would like to have a copy of pt's most recent physical. She needs it for her insurance company. Pt would like to be contacted when it is ready to be picked up. Pt will be in [place] this afternoon for another appointment around 3 and would like to try and pick this up today. I told them we couldn't guarantee it would be today.

## 2018-08-13 ENCOUNTER — Encounter: Payer: Self-pay | Admitting: Cardiovascular Disease

## 2018-08-13 NOTE — Telephone Encounter (Signed)
Informed pt wife that a Patient Request for Access would be up front for her to fill out and then the front desk would be able to print his last physical information. Katharina Caper, April D, [place]

## 2018-08-30 ENCOUNTER — Encounter (HOSPITAL_COMMUNITY): Payer: Self-pay | Admitting: Urology

## 2018-09-29 ENCOUNTER — Other Ambulatory Visit: Payer: Self-pay | Admitting: Family Medicine

## 2018-09-29 DIAGNOSIS — Z1211 Encounter for screening for malignant neoplasm of colon: Secondary | ICD-10-CM

## 2018-10-04 ENCOUNTER — Ambulatory Visit: Payer: PPO | Admitting: Family Medicine

## 2018-10-12 ENCOUNTER — Ambulatory Visit (INDEPENDENT_AMBULATORY_CARE_PROVIDER_SITE_OTHER): Payer: PPO | Admitting: Cardiology

## 2018-10-12 ENCOUNTER — Encounter: Payer: Self-pay | Admitting: Cardiology

## 2018-10-12 DIAGNOSIS — I10 Essential (primary) hypertension: Secondary | ICD-10-CM

## 2018-10-12 DIAGNOSIS — G20 Parkinson's disease: Secondary | ICD-10-CM

## 2018-10-12 DIAGNOSIS — E785 Hyperlipidemia, unspecified: Secondary | ICD-10-CM | POA: Diagnosis not present

## 2018-10-12 NOTE — Progress Notes (Signed)
Thanks, Luke MCr 

## 2018-10-12 NOTE — Assessment & Plan Note (Signed)
Has both true hypertension and white coat hypertension.  Avoid over treatment based on office readings, which run high. 

## 2018-10-12 NOTE — Assessment & Plan Note (Signed)
CAD s/p DES RCA (07/2012 - Promus Element 3.0x32)

## 2018-10-12 NOTE — Patient Instructions (Signed)
Medication Instructions:  Your physician recommends that you continue on your current medications as directed. Please refer to the Current Medication list given to you today. If you need a refill on your cardiac medications before your next appointment, please call your pharmacy.   Lab work: None  If you have labs (blood work) drawn today and your tests are completely normal, you will receive your results only by: Marland Kitchen MyChart Message (if you have MyChart) OR . A paper copy in the mail If you have any lab test that is abnormal or we need to change your treatment, we will call you to review the results.  Testing/Procedures: None   Follow-Up: At [facility], you and your health needs are our priority.  As part of our continuing mission to provide you with exceptional heart care, we have created designated Provider Care Teams.  These Care Teams include your primary Cardiologist (physician) and Advanced Practice Providers (APPs -  Physician Assistants and Nurse Practitioners) who all work together to provide you with the care you need, when you need it. . Your physician recommends that you schedule a follow-up appointment in: 3 months with Dr Sallyanne Kuster.  Any Other Special Instructions Will Be Listed Below (If Applicable).

## 2018-10-12 NOTE — Progress Notes (Signed)
10/12/2018 Christopher Weeks   May 13, 1951  [id number]  Primary Physician Hensel, Jamal Collin, MD Primary Cardiologist: Dr Sallyanne Kuster  HPI:  Pleasant 67 year old male followed by Dr. Sallyanne Kuster with a history of coronary disease.  He initially presented with unstable angina in 2013.  He underwent drug-eluting stent placement to his RCA.  He had residual 80% stenosis in the first diagonal and 60% in the proximal circumflex as well as another 60% stenosis downstream of the stented RCA.  He has done well on medical therapy until recently when he noted some chest pain.  He saw Dr. Sallyanne Kuster in the office a few weeks ago and his nitrates were increased.  He is here today for follow-up.  The patient tells me he is doing well, he has not had further angina.  Other medical problems include fairly significant Parkinson's disease and bradycardia.  He is also had hypertension with a orthostatic component, most likely secondary to his Parkinson's.  His repeat blood pressure is 132/80.   Current Outpatient Medications  Medication Sig Dispense Refill  . aspirin 81 MG tablet Take 81 mg by mouth at bedtime.     Marland Kitchen atorvastatin (LIPITOR) 80 MG tablet TAKE (1) TABLET BY MOUTH AT BEDTIME. 90 tablet 3  . Calcium-Vitamin D 600-125 MG-UNIT TABS Take 1 tablet by mouth daily.     . carbidopa-levodopa (SINEMET IR) 25-100 MG tablet TAKE 2 TABLETS BY MOUTH THREE TIMES DAILY. 180 tablet 12  . cephALEXin (KEFLEX) 500 MG capsule Take 1 capsule (500 mg total) by mouth 4 (four) times daily. 28 capsule 0  . citalopram (CELEXA) 40 MG tablet Take 1 tablet (40 mg total) by mouth daily. 90 tablet 3  . isosorbide mononitrate (IMDUR) 60 MG 24 hr tablet Take 1 tablet (60 mg total) by mouth daily. KEEP OV. 90 tablet 3  . lisinopril (PRINIVIL,ZESTRIL) 20 MG tablet TAKE (1) TABLET BY MOUTH ONCE DAILY. 30 tablet 12  . nitroGLYCERIN (NITROLINGUAL) 0.4 MG/SPRAY spray Place 1 spray under the tongue every 5 (five) minutes x 3 doses as needed for chest  pain. 12 g 3  . ondansetron (ZOFRAN ODT) 4 MG disintegrating tablet 4mg  ODT q4 hours prn nausea/vomit 12 tablet 0  . oxyCODONE-acetaminophen (PERCOCET/ROXICET) 5-325 MG tablet Take 1 tablet by mouth every 6 (six) hours as needed. 20 tablet 0  . ranitidine (ZANTAC) 150 MG capsule Take 150 mg by mouth 2 (two) times daily.    . tamsulosin (FLOMAX) 0.4 MG CAPS capsule Take 1 capsule (0.4 mg total) by mouth daily. 6 capsule 0   No current facility-administered medications for this visit.     Allergies  Allergen Reactions  . Codeine Hives    Denies Airway involvement    Past Medical History:  Diagnosis Date  . Anxiety   . Depression   . Dysrhythmia   . GERD (gastroesophageal reflux disease)   . Heart murmur   . History of colon polyps   . History of kidney stones   . Hypertension   . Kidney stones   . Myocardial infarction ([place])   . Parkinson disease ([place])   . Parkinson's disease [facility])     Social History   Socioeconomic History  . Marital status: Married    Spouse name: Vickie  . Number of children: 2  . Years of education: 47  . Highest education level: Not on file  Occupational History  . Occupation: Disabled-maintenance work  Scientific laboratory technician  . Financial resource strain: Not on file  .  Food insecurity:    Worry: Not on file    Inability: Not on file  . Transportation needs:    Medical: Not on file    Non-medical: Not on file  Tobacco Use  . Smoking status: Former Smoker    Packs/day: 1.00    Years: 20.00    Pack years: 20.00    Types: Cigarettes    Last attempt to quit: 11/10/1996    Years since quitting: 21.9  . Smokeless tobacco: Never Used  Substance and Sexual Activity  . Alcohol use: No    Alcohol/week: 0.0 standard drinks  . Drug use: Yes    Types: Marijuana    Comment: not using, uses CBD oil now- 04/21/18  . Sexual activity: Yes    Comment: monogamous  Lifestyle  . Physical activity:    Days per week: Not on file    Minutes per session: Not on file    . Stress: Not on file  Relationships  . Social connections:    Talks on phone: Not on file    Gets together: Not on file    Attends religious service: Not on file    Active member of club or organization: Not on file    Attends meetings of clubs or organizations: Not on file    Relationship status: Not on file  . Intimate partner violence:    Fear of current or ex partner: Not on file    Emotionally abused: Not on file    Physically abused: Not on file    Forced sexual activity: Not on file  Other Topics Concern  . Not on file  Social History Narrative  . Not on file     Family History  Adopted: Yes     Review of Systems: General: negative for chills, fever, night sweats or weight changes.  Cardiovascular: negative for chest pain, dyspnea on exertion, edema, orthopnea, palpitations, paroxysmal nocturnal dyspnea or shortness of breath Dermatological: negative for rash Respiratory: negative for cough or wheezing Urologic: negative for hematuria Abdominal: negative for nausea, vomiting, diarrhea, bright red blood per rectum, melena, or hematemesis Neurologic: negative for visual changes, syncope, or dizziness All other systems reviewed and are otherwise negative except as noted above.    Blood pressure 134/82, pulse (!) 55, height 6' (1.829 m), weight 205 lb (93 kg), SpO2 96 %.  General appearance: alert, cooperative, appears older than stated age and no distress Neck: no JVD Lungs: clear to auscultation bilaterally Heart: regular rate and rhythm Neurologic: Grossly normal , resting tremor   ASSESSMENT AND PLAN:   CAD S/P percutaneous coronary angioplasty CAD s/p DES RCA (07/2012 - Promus Element 3.0x32)  Essential hypertension, benign Has both true hypertension and white coat hypertension.  Avoid over treatment based on office readings, which run high.    Dyslipidemia, goal LDL below 70 LDL 89 %/30/19- on high dose statin  Parkinson's disease Tremor began  Jan/Feb 2013 and seems to be progressive  Seen by neuro and diagnosed with Parkinson's Disease.    PLAN Mr. Foushee is doing well on Imdur 60 mg.  Repeat blood pressure was 132/80.  I did not change his medications.  He will follow-up with Dr. Sallyanne Kuster in 3 months.  Kerin Ransom PA-C 10/12/2018 10:36 AM

## 2018-10-12 NOTE — Assessment & Plan Note (Signed)
Tremor began Jan/Feb 2013 and seems to be progressive  Seen by neuro and diagnosed with Parkinson's Disease.

## 2018-10-12 NOTE — Assessment & Plan Note (Signed)
LDL 89 %/30/19- on high dose statin

## 2018-10-18 ENCOUNTER — Other Ambulatory Visit: Payer: Self-pay | Admitting: Family Medicine

## 2018-10-18 DIAGNOSIS — F321 Major depressive disorder, single episode, moderate: Secondary | ICD-10-CM

## 2018-10-27 ENCOUNTER — Other Ambulatory Visit: Payer: Self-pay | Admitting: Family Medicine

## 2018-11-11 ENCOUNTER — Encounter: Payer: Self-pay | Admitting: *Deleted

## 2018-11-17 ENCOUNTER — Other Ambulatory Visit: Payer: Self-pay | Admitting: Family Medicine

## 2018-11-17 DIAGNOSIS — I10 Essential (primary) hypertension: Secondary | ICD-10-CM

## 2019-02-15 ENCOUNTER — Ambulatory Visit: Payer: PPO | Admitting: Cardiovascular Disease

## 2019-02-17 ENCOUNTER — Telehealth: Payer: Self-pay | Admitting: Cardiovascular Disease

## 2019-02-17 NOTE — Telephone Encounter (Signed)
LVM to call and schedule appt. 02-17-19 ST

## 2019-05-09 ENCOUNTER — Telehealth (INDEPENDENT_AMBULATORY_CARE_PROVIDER_SITE_OTHER): Payer: PPO | Admitting: Family Medicine

## 2019-05-09 ENCOUNTER — Other Ambulatory Visit: Payer: Self-pay

## 2019-05-09 DIAGNOSIS — R079 Chest pain, unspecified: Secondary | ICD-10-CM

## 2019-05-09 MED ORDER — FAMOTIDINE 20 MG PO TABS: 20.0000 mg | ORAL_TABLET | Freq: Two times a day (BID) | ORAL | 2 refills | Status: DC

## 2019-05-09 NOTE — Progress Notes (Signed)
  [facility] Telemedicine Visit  Patient consented to have virtual visit. Method of visit: Video was attempted, but technology challenges prevented patient from using video, so visit was conducted via telephone.  Encounter participants: Patient: Christopher Weeks - located at home Provider: Steve Rattler - located at [facility] Others (if applicable): daughter   Chief Complaint: central chest pain  HPI:  Patient reports the sensation of a "knot" in the center of his chest that he wants to cough up but is unable to every morning for the past 2 weeks. This occurs at 5 am every morning, lasts about 15 minutes. Better with drinking some water. Laying down makes it worse.  When questioned further this happens also at night around 7 or 8. He tried tums with some relief. He is taking PPI for the past 6 weeks. He is able to eat and drink normally during the day.   He reports the same sensation occurred while mowing the lawn last week. He sat down and it went away.   Currently denies any chest pain, SOB, diaphoresis  No fevers, no abnormal runny nose, congestion, no sore throat No sick contacts  ROS: per HPI  Pertinent PMHx: CAD w/ stent  Exam:  Respiratory: speaks in full sentences  Assessment/Plan:  1. Central chest pain Concerning given this did occur once with exertion and subsided with rest but otherwise has been happening every morning and every night at rest and sounds more consistent with reflux. Unclear if this is related to reflux or heart disease. Asked patient to call cards for follow up and go to ED if recurs. Will send in Rx for H2 blocker to use BID in the meantime to see if this helps. If no improvement in next several days advised follow up.   Time spent during visit with patient: 15 minutes  Lucila Maine, DO PGY-3, [facility] 05/09/2019 4:07 PM

## 2019-05-10 ENCOUNTER — Telehealth: Payer: Self-pay | Admitting: Cardiovascular Disease

## 2019-05-10 NOTE — Telephone Encounter (Signed)
Call placed to the patient's daughter, per dpr. She stated that the patient has been having intermittent chest discomfort that normally starts around 5 am. The discomfort is normally resolved when he burps. He is currently taking Esomeprazole 20 mg daily which replaced Ranitidine (Epic has been updated). His PCP also added Pepcid.   The patient is passed due for a follow up with Dr. Sallyanne Kuster. An appointment has been made for 05/12/2019.     Virtual Visit Pre-Appointment Phone Call  "(Name), I am calling you today to discuss your upcoming appointment. We are currently trying to limit exposure to the virus that causes COVID-19 by seeing patients at home rather than in the office."  1. "What is the BEST phone number to call the day of the visit?" - include this in appointment notes  2. "Do you have or have access to (through a family member/friend) a smartphone with video capability that we can use for your visit?" a. If yes - list this number in appt notes as "cell" (if different from BEST phone #) and list the appointment type as a VIDEO visit in appointment notes b. If no - list the appointment type as a PHONE visit in appointment notes  3. Confirm consent - "In the setting of the current Covid19 crisis, you are scheduled for a (phone or video) visit with your provider on (date) at (time).  Just as we do with many in-office visits, in order for you to participate in this visit, we must obtain consent.  If you'd like, I can send this to your mychart (if signed up) or email for you to review.  Otherwise, I can obtain your verbal consent now.  All virtual visits are billed to your insurance company just like a normal visit would be.  By agreeing to a virtual visit, we'd like you to understand that the technology does not allow for your provider to perform an examination, and thus may limit your provider's ability to fully assess your condition. If your provider identifies any concerns that need to be  evaluated in person, we will make arrangements to do so.  Finally, though the technology is pretty good, we cannot assure that it will always work on either your or our end, and in the setting of a video visit, we may have to convert it to a phone-only visit.  In either situation, we cannot ensure that we have a secure connection.  Are you willing to proceed?" YES  4. Advise patient to be prepared - "Two hours prior to your appointment, go ahead and check your blood pressure, pulse, oxygen saturation, and your weight (if you have the equipment to check those) and write them all down. When your visit starts, your provider will ask you for this information. If you have an Apple Watch or Kardia device, please plan to have heart rate information ready on the day of your appointment. Please have a pen and paper handy nearby the day of the visit as well."  5. Give patient instructions for MyChart download to smartphone OR Doximity/[email or web address] as below if video visit (depending on what platform provider is using)  6. Inform patient they will receive a phone call 15 minutes prior to their appointment time (may be from unknown caller ID) so they should be prepared to answer    TELEPHONE CALL NOTE  Christopher Weeks has been deemed a candidate for a follow-up tele-health visit to limit community exposure during the Covid-19 pandemic. I spoke with the  patient via phone to ensure availability of phone/video source, confirm preferred email & phone number, and discuss instructions and expectations.  I reminded Christopher Weeks to be prepared with any vital sign and/or heart rhythm information that could potentially be obtained via home monitoring, at the time of his visit. I reminded Christopher Weeks to expect a phone call prior to his visit.  Ricci Barker, RN 05/10/2019 1:22 PM   INSTRUCTIONS FOR DOWNLOADING THE MYCHART APP TO SMARTPHONE  - The patient must first make sure to have activated MyChart and know their  login information - If Apple, go to [employer] and type in MyChart in the search bar and download the app. If Android, ask patient to go to [employer] and type in [place] in the search bar and download the app. The app is free but as with any other app downloads, their phone may require them to verify saved payment information or Apple/Android password.  - The patient will need to then log into the app with their MyChart username and password, and select [facility] as their healthcare provider to link the account. When it is time for your visit, go to the MyChart app, find appointments, and click Begin Video Visit. Be sure to Select Allow for your device to access the Microphone and Camera for your visit. You will then be connected, and your provider will be with you shortly.  **If they have any issues connecting, or need assistance please contact MyChart service desk [phone])**  **If using a computer, in order to ensure the best quality for their visit they will need to use either of the following Internet Browsers: [employer], or Google Chrome**  IF USING DOXIMITY or DOXY.ME - The patient will receive a link just prior to their visit by text.     FULL LENGTH CONSENT FOR TELE-HEALTH VISIT   I hereby voluntarily request, consent and authorize [facility] and its employed or contracted physicians, physician assistants, nurse practitioners or other licensed health care professionals (the Practitioner), to provide me with telemedicine health care services (the "Services") as deemed necessary by the treating Practitioner. I acknowledge and consent to receive the Services by the Practitioner via telemedicine. I understand that the telemedicine visit will involve communicating with the Practitioner through live audiovisual communication technology and the disclosure of certain medical information by electronic transmission. I acknowledge that I have been given the  opportunity to request an in-person assessment or other available alternative prior to the telemedicine visit and am voluntarily participating in the telemedicine visit.  I understand that I have the right to withhold or withdraw my consent to the use of telemedicine in the course of my care at any time, without affecting my right to future care or treatment, and that the Practitioner or I may terminate the telemedicine visit at any time. I understand that I have the right to inspect all information obtained and/or recorded in the course of the telemedicine visit and may receive copies of available information for a reasonable fee.  I understand that some of the potential risks of receiving the Services via telemedicine include:  Marland Kitchen Delay or interruption in medical evaluation due to technological equipment failure or disruption; . Information transmitted may not be sufficient (e.g. poor resolution of images) to allow for appropriate medical decision making by the Practitioner; and/or  . In rare instances, security protocols could fail, causing a breach of personal health information.  Furthermore, I acknowledge that it  is my responsibility to provide information about my medical history, conditions and care that is complete and accurate to the best of my ability. I acknowledge that Practitioner's advice, recommendations, and/or decision may be based on factors not within their control, such as incomplete or inaccurate data provided by me or distortions of diagnostic images or specimens that may result from electronic transmissions. I understand that the practice of medicine is not an exact science and that Practitioner makes no warranties or guarantees regarding treatment outcomes. I acknowledge that I will receive a copy of this consent concurrently upon execution via email to the email address I last provided but may also request a printed copy by calling the office of [facility].    I understand that  my insurance will be billed for this visit.   I have read or had this consent read to me. . I understand the contents of this consent, which adequately explains the benefits and risks of the Services being provided via telemedicine.  . I have been provided ample opportunity to ask questions regarding this consent and the Services and have had my questions answered to my satisfaction. . I give my informed consent for the services to be provided through the use of telemedicine in my medical care  By participating in this telemedicine visit I agree to the above.

## 2019-05-10 NOTE — Telephone Encounter (Signed)
  Daughter is calling because Mr Fieldhouse has been having chest pains and he went to his PCP yesterday and they gave him famotidine 20 mg twice daily. They would like to know if it is safe for him to take this medication. He has been having the chest pains for the past two weeks. He says it comes and goes but mostly happens around 5 am and it could be indigestion because when he burps he feels better.

## 2019-05-12 ENCOUNTER — Encounter: Payer: Self-pay | Admitting: Cardiovascular Disease

## 2019-05-12 ENCOUNTER — Telehealth: Payer: Self-pay | Admitting: *Deleted

## 2019-05-12 ENCOUNTER — Other Ambulatory Visit: Payer: Self-pay | Admitting: *Deleted

## 2019-05-12 ENCOUNTER — Other Ambulatory Visit (HOSPITAL_COMMUNITY)
Admission: RE | Admit: 2019-05-12 | Discharge: 2019-05-12 | Disposition: A | Discharge status: Home / Self Care | Payer: PPO | Source: Ambulatory Visit | Attending: Cardiology | Admitting: Cardiology

## 2019-05-12 ENCOUNTER — Telehealth (INDEPENDENT_AMBULATORY_CARE_PROVIDER_SITE_OTHER): Payer: PPO | Admitting: Cardiovascular Disease

## 2019-05-12 VITALS — BP 153/98 | HR 67 | Ht 72.0 in | Wt 190.0 lb

## 2019-05-12 DIAGNOSIS — I25118 Atherosclerotic heart disease of native coronary artery with other forms of angina pectoris: Secondary | ICD-10-CM | POA: Insufficient documentation

## 2019-05-12 DIAGNOSIS — Z01812 Encounter for preprocedural laboratory examination: Secondary | ICD-10-CM | POA: Diagnosis not present

## 2019-05-12 DIAGNOSIS — E785 Hyperlipidemia, unspecified: Secondary | ICD-10-CM | POA: Diagnosis not present

## 2019-05-12 DIAGNOSIS — I10 Essential (primary) hypertension: Secondary | ICD-10-CM

## 2019-05-12 DIAGNOSIS — Z1159 Encounter for screening for other viral diseases: Secondary | ICD-10-CM | POA: Diagnosis not present

## 2019-05-12 DIAGNOSIS — R7303 Prediabetes: Secondary | ICD-10-CM

## 2019-05-12 DIAGNOSIS — G20 Parkinson's disease: Secondary | ICD-10-CM | POA: Diagnosis not present

## 2019-05-12 DIAGNOSIS — I200 Unstable angina: Secondary | ICD-10-CM

## 2019-05-12 HISTORY — DX: Atherosclerotic heart disease of native coronary artery with other forms of angina pectoris: I25.118

## 2019-05-12 LAB — SARS CORONAVIRUS 2 (TAT 6-24 HRS): SARS Coronavirus 2: NEGATIVE

## 2019-05-12 MED ORDER — AMLODIPINE BESYLATE 5 MG PO TABS: 5.0000 mg | ORAL_TABLET | Freq: Every day | ORAL | 3 refills | Status: DC

## 2019-05-12 MED ORDER — NITROGLYCERIN 0.4 MG SL SUBL: 0.4000 mg | SUBLINGUAL_TABLET | SUBLINGUAL | 3 refills | Status: DC | PRN

## 2019-05-12 MED ORDER — SODIUM CHLORIDE 0.9% FLUSH: 3.0000 mL | Freq: Two times a day (BID) | INTRAVENOUS | Status: DC

## 2019-05-12 MED ORDER — EZETIMIBE 10 MG PO TABS: 10.0000 mg | ORAL_TABLET | Freq: Every day | ORAL | 3 refills | Status: DC

## 2019-05-12 MED ORDER — ISOSORBIDE MONONITRATE ER 60 MG PO TB24: ORAL_TABLET | ORAL | 3 refills | Status: DC

## 2019-05-12 NOTE — H&P (View-Only) (Signed)
Virtual Visit via Video Note   This visit type was conducted due to national recommendations for restrictions regarding the COVID-19 Pandemic (e.g. social distancing) in an effort to limit this patient's exposure and mitigate transmission in our community.  Due to his co-morbid illnesses, this patient is at least at moderate risk for complications without adequate follow up.  This format is felt to be most appropriate for this patient at this time.  All issues noted in this document were discussed and addressed.  A limited physical exam was performed with this format.  Please refer to the patient's chart for his consent to telehealth for [facility].   Date:  05/12/2019   ID:  Christopher Weeks, DOB 1951-09-12, MRN [id number]  Patient Location: Home Provider Location: Other:  [place]  PCP:  Zenia Resides, MD  Cardiologist:  Tyiesha Brackney Electrophysiologist:  None   Evaluation Performed:  Follow-Up Visit  Chief Complaint:  Exertional angina  History of Present Illness:    Christopher Weeks is a 68 y.o. male with coronary artery disease (acute inferior MI, drug-eluting 3.0 x32 mm Promus stent to RCA in 2013; high-grade stenoses in small caliber left circumflex and first diagonal arteries, left for medical therapy), hyperlipidemia, hypertension, prediabetes, Parkinson's disease.  Angina has been well controlled for many years on long-acting nitrates.  Over the last 2 weeks he is experienced a fairly rapid recurrence with angina pectoris with moderate activity (mowing the lawn, walking more than 500 feet).  Symptoms persisted for about 10 minutes with rest.  They will did not initially admit to it, but his wife reports that when he wakes up at 5:00 in the morning he has mild chest discomfort.  He denies dyspnea, palpitations, syncope, claudication, focal neurological complaints or lower extremity edema.  His Parkinson symptoms are reasonably well-controlled on the current medical regimen and he is  moderately active.  He is on maximum dose atorvastatin, but his last LDL cholesterol level was in the 80s.  He takes daily low-dose aspirin.  In the past he did not tolerate beta-blockers due to profound and symptomatic bradycardia.  He had a normal nuclear perfusion study in 2017.  The patient does not have symptoms concerning for COVID-19 infection (fever, chills, cough, or new shortness of breath).    Past Medical History:  Diagnosis Date  . Anxiety   . Depression   . Dysrhythmia   . GERD (gastroesophageal reflux disease)   . Heart murmur   . History of colon polyps   . History of kidney stones   . Hypertension   . Kidney stones   . Myocardial infarction ([place])   . Parkinson disease ([place])   . Parkinson's disease [facility])    Past Surgical History:  Procedure Laterality Date  . anal Energy manager    . CARDIAC CATHETERIZATION  07/11/2012   high graded stenoses small ift circ and small 1st diag   which were  left for medicl therapy, lv systoilc fx preserved  . CORONARY ANGIOPLASTY  07/11/2012   90% and 70% lesions RCA  3.0 x32 mm Promus DES  . CORONARY STENT PLACEMENT  07/11/12  . EXTRACORPOREAL SHOCK WAVE LITHOTRIPSY Left 04/22/2018   Procedure: LEFT EXTRACORPOREAL SHOCK WAVE LITHOTRIPSY (ESWL);  Surgeon: Cleon Gustin, MD;  Location: [facility] ORS;  Service: Urology;  Laterality: Left;  . EXTRACORPOREAL SHOCK WAVE LITHOTRIPSY Left 05/31/2018   Procedure: LEFT EXTRACORPOREAL SHOCK WAVE LITHOTRIPSY (ESWL);  Surgeon: Alexis Frock, MD;  Location: [facility] OR[facility];  Service: Urology;  Laterality: Left;  [phone] [employer] [id number]  . LEFT HEART CATH N/A 07/11/2012   Procedure: LEFT HEART CATH;  Surgeon: Sanda Klein, MD;  Location: [place] CATH LAB;  Service: Cardiovascular;  Laterality: N/A;  . PERCUTANEOUS CORONARY STENT INTERVENTION (PCI-S) Right 07/11/2012   Procedure: PERCUTANEOUS CORONARY STENT INTERVENTION (PCI-S);  Surgeon: Sanda Klein, MD;  Location: [facility] CATH LAB;  Service:  Cardiovascular;  Laterality: Right;     Current Meds  Medication Sig  . aspirin 81 MG tablet Take 81 mg by mouth at bedtime.   Marland Kitchen atorvastatin (LIPITOR) 80 MG tablet TAKE (1) TABLET BY MOUTH AT BEDTIME.  . Calcium-Vitamin D 600-125 MG-UNIT TABS Take 1 tablet by mouth daily.   . carbidopa-levodopa (SINEMET IR) 25-100 MG tablet TAKE 2 TABLETS BY MOUTH THREE TIMES DAILY.  . citalopram (CELEXA) 40 MG tablet TAKE ONE TABLET BY MOUTH DAILY.  Marland Kitchen esomeprazole (NEXIUM) 20 MG capsule Take 20 mg by mouth daily at 12 noon.  . famotidine (PEPCID) 20 MG tablet Take 1 tablet (20 mg total) by mouth 2 (two) times daily.  . isosorbide mononitrate (IMDUR) 60 MG 24 hr tablet Take 1 tablet (60 mg total) by mouth daily. KEEP OV.  Marland Kitchen lisinopril (PRINIVIL,ZESTRIL) 20 MG tablet TAKE (1) TABLET BY MOUTH ONCE DAILY.  Marland Kitchen ondansetron (ZOFRAN ODT) 4 MG disintegrating tablet 4mg  ODT q4 hours prn nausea/vomit  . oxyCODONE-acetaminophen (PERCOCET/ROXICET) 5-325 MG tablet Take 1 tablet by mouth every 6 (six) hours as needed.  . ranitidine (ZANTAC) 150 MG capsule Take 150 mg by mouth 2 (two) times daily.  . tamsulosin (FLOMAX) 0.4 MG CAPS capsule Take 1 capsule (0.4 mg total) by mouth daily.  . [DISCONTINUED] nitroGLYCERIN (NITROLINGUAL) 0.4 MG/SPRAY spray Place 1 spray under the tongue every 5 (five) minutes x 3 doses as needed for chest pain.     Allergies:   Codeine   Social History   Tobacco Use  . Smoking status: Former Smoker    Packs/day: 1.00    Years: 20.00    Pack years: 20.00    Types: Cigarettes    Quit date: 11/10/1996    Years since quitting: 22.5  . Smokeless tobacco: Never Used  Substance Use Topics  . Alcohol use: No    Alcohol/week: 0.0 standard drinks  . Drug use: Yes    Types: Marijuana    Comment: not using, uses CBD oil now- 04/21/18     Family Hx: The patient's family history is not on file. He was adopted.  ROS:   Please see the history of present illness.     All other systems reviewed  and are negative.   Prior CV studies:   The following studies were reviewed today: Nuclear stress test 2017  Labs/Other Tests and Data Reviewed:    EKG:  An ECG dated 08/12/2018 was personally reviewed today and demonstrated:  Sinus bradycardia, otherwise normal tracing  Recent Labs: No results found for requested labs within last 8760 hours.   Recent Lipid Panel Lab Results  Component Value Date/Time   CHOL 160 04/08/2018 10:52 AM   TRIG 161 (H) 04/08/2018 10:52 AM   HDL 39 (L) 04/08/2018 10:52 AM   CHOLHDL 4.1 04/08/2018 10:52 AM   CHOLHDL 4.7 02/12/2016 10:00 AM   LDLCALC 89 04/08/2018 10:52 AM    Wt Readings from Last 3 Encounters:  05/12/19 190 lb (86.2 kg)  10/12/18 205 lb (93 kg)  08/12/18 201 lb 12.8 oz (91.5 kg)     Objective:    Vital  Signs:  BP (!) 153/98   Pulse 67   Ht 6' (1.829 m)   Wt 190 lb (86.2 kg)   BMI 25.77 kg/m    VITAL SIGNS:  reviewed GEN:  no acute distress EYES:  sclerae anicteric, EOMI - Extraocular Movements Intact RESPIRATORY:  normal respiratory effort, symmetric expansion CARDIOVASCULAR:  no peripheral edema SKIN:  no rash, lesions or ulcers. MUSCULOSKELETAL:  no obvious deformities. NEURO:  alert and oriented x 3, no obvious focal deficit PSYCH:  normal affect  ASSESSMENT & PLAN:    1. Angina pectoris, possibly unstable angina: He describes his chest discomfort as very similar to what he experienced in 2013.  Although most of the symptoms occur with activity, there appears to be a significant pattern of crescendo angina and his wife reports that he has had some symptoms at rest early in the morning.  We will give him a fresh prescription for sublingual nitroglycerin and will add another 30 mg of isosorbide mononitrate just before bedtime.  Add amlodipine 5 mg daily.  Repeatedly advised him that he should immediately seek urgent medical care if he has symptoms that persist for 30 minutes or longer, not relieved by 3 consecutive  sublingual nitroglycerin tablets.  We will try to set him up as soon as possible for coronary angiography and possible angioplasty/stent placement.  I do not think performing a functional study would be the right approach, especially with the current coronavirus restrictions. This procedure has been fully reviewed with the patient and written informed consent has been obtained. We may not find coronary lesions that are amenable to revascularization, judging by his previous angiograms. We will give him a fresh prescription for sublingual nitroglycerin and will add another 30 mg of isosorbide mononitrate just before bedtime.  Add amlodipine 5 mg daily.  Beta-blocker intolerant due to bradycardia. 2. HTN: His blood pressure is quite high today, but this is unusual for him (usually blood pressure is around 130/80).  This may be related to anxiety.  We will add amlodipine 5 mg once daily primarily as an antianginal.   It is possible that he will not tolerate this since he has had some issues with orthostatic hypotension since the diagnosis of Parkinson's disease. 3. HLP: LDL is not at target despite maximum dose atorvastatin.  Add ezetimibe 10 mg daily.  Lipid profile is also significant for low HDL and high triglycerides, consistent with a metabolic syndrome and likely an unfavorable LDL particle profile. 4. PreDM: He has had a couple of blood sugars in the diabetes range last year (judging by the time they were drawn probably not fasting), but his most recent hemoglobin A1c was 5.9%.  COVID-19 Education: The signs and symptoms of COVID-19 were discussed with the patient and how to seek care for testing (follow up with PCP or arrange E-visit).  The importance of social distancing was discussed today.  Time:   Today, I have spent 29 minutes with the patient with telehealth technology discussing the above problems.     Medication Adjustments/Labs and Tests Ordered: Current medicines are reviewed at length  with the patient today.  Concerns regarding medicines are outlined above.   Tests Ordered: No orders of the defined types were placed in this encounter.   Medication Changes: No orders of the defined types were placed in this encounter.  Patient Instructions  Medication Instructions:  START Amlodipine 5 mg once daily CHANGE how you take the Isosorbide (Imdur) to 60 mg in the morning and 30 mg  at bedtime START Ezetimibe (Zetia) 10 mg once daily Nitroglycerin has   If you need a refill on your cardiac medications before your next appointment, please call your pharmacy.   Lab work: Your provider would like for you to have the following labs today: BMET, Lipid, A1C and CBC  If you have labs (blood work) drawn today and your tests are completely normal, you will receive your results only by: Marland Kitchen MyChart Message (if you have MyChart) OR . A paper copy in the mail If you have any lab test that is abnormal or we need to change your treatment, we will call you to review the results.  Testing/Procedures: Your physician has requested that you have a cardiac catheterization. Cardiac catheterization is used to diagnose and/or treat various heart conditions. Doctors may recommend this procedure for a number of different reasons. The most common reason is to evaluate chest pain. Chest pain can be a symptom of coronary artery disease (CAD), and cardiac catheterization can show whether plaque is narrowing or blocking your heart's arteries. This procedure is also used to evaluate the valves, as well as measure the blood flow and oxygen levels in different parts of your heart. For further information please visit [email or web address]. Please follow instruction sheet, as given.    Follow-Up: At [facility], you and your health needs are our priority.  As part of our continuing mission to provide you with exceptional heart care, we have created designated Provider Care Teams.  These Care Teams include your  primary Cardiologist (physician) and Advanced Practice Providers (APPs -  Physician Assistants and Nurse Practitioners) who all work together to provide you with the care you need, when you need it. You will need a follow up appointment in one month. You may see Sanda Klein, MD or one of the following Advanced Practice Providers on your designated Care Team: [place], [place] . Fabian Sharp, PA-C  Any Other Special Instructions Will Be Listed Below (If Applicable).    [place] [address] Dept: [phone] Loc: [phone]  Christopher Weeks  05/12/2019  You are scheduled for a Cardiac Catheterization on Monday, July 2 with Dr. Glenetta Hew.  1. Please arrive at the [facility] (Main Entrance A) at [facility]: [address] [place], [place] [address] at 7:00 AM (This time is two hours before your procedure to ensure your preparation). Free valet parking service is available.   Special note: Every effort is made to have your procedure done on time. Please understand that emergencies sometimes delay scheduled procedures.  2. Diet: Do not eat solid foods after midnight.  The patient may have clear liquids until 5am upon the day of the procedure.  3. Labs: You will need to have blood drawn today at the [facility]. You do not need to be fasting.  4. Medication instructions in preparation for your procedure:   On the morning of your procedure, take your Aspirin and any morning medicines NOT listed above.  You may use sips of water.  5. Plan for one night stay--bring personal belongings. 6. Bring a current list of your medications and current insurance cards. 7. You MUST have a responsible person to drive you home. 8. Someone MUST be with you the first 24 hours after you arrive home or your discharge will be delayed. 9. Please wear clothes that are easy to get on and off  and wear slip-on shoes.  Thank you for allowing [place] to care for you!   -- [place] [facility] Cardi[facility] services      Follow Up:  Virtual Visit or In Person 3 months  Signed, Sanda Klein, MD  05/12/2019 10:16 AM    [place]

## 2019-05-12 NOTE — Addendum Note (Signed)
Addended by: Ricci Barker on: 05/12/2019 10:27 AM   Modules accepted: Orders

## 2019-05-12 NOTE — Progress Notes (Signed)
Virtual Visit via Video Note   This visit type was conducted due to national recommendations for restrictions regarding the COVID-19 Pandemic (e.g. social distancing) in an effort to limit this patient's exposure and mitigate transmission in our community.  Due to his co-morbid illnesses, this patient is at least at moderate risk for complications without adequate follow up.  This format is felt to be most appropriate for this patient at this time.  All issues noted in this document were discussed and addressed.  A limited physical exam was performed with this format.  Please refer to the patient's chart for his consent to telehealth for [facility].   Date:  05/12/2019   ID:  Christopher Weeks, DOB Nov 18, 1950, MRN [id number]  Patient Location: Home Provider Location: Other:  [place]  PCP:  Christopher Resides, MD  Cardiologist:  Christopher Weeks Electrophysiologist:  None   Evaluation Performed:  Follow-Up Visit  Chief Complaint:  Exertional angina  History of Present Illness:    Christopher Weeks is a 68 y.o. male with coronary artery disease (acute inferior MI, drug-eluting 3.0 x32 mm Promus stent to RCA in 2013; high-grade stenoses in small caliber left circumflex and first diagonal arteries, left for medical therapy), hyperlipidemia, hypertension, prediabetes, Parkinson's disease.  Angina has been well controlled for many years on long-acting nitrates.  Over the last 2 weeks he is experienced a fairly rapid recurrence with angina pectoris with moderate activity (mowing the lawn, walking more than 500 feet).  Symptoms persisted for about 10 minutes with rest.  They will did not initially admit to it, but his wife reports that when he wakes up at 5:00 in the morning he has mild chest discomfort.  He denies dyspnea, palpitations, syncope, claudication, focal neurological complaints or lower extremity edema.  His Parkinson symptoms are reasonably well-controlled on the current medical regimen and he is  moderately active.  He is on maximum dose atorvastatin, but his last LDL cholesterol level was in the 80s.  He takes daily low-dose aspirin.  In the past he did not tolerate beta-blockers due to profound and symptomatic bradycardia.  He had a normal nuclear perfusion study in 2017.  The patient does not have symptoms concerning for COVID-19 infection (fever, chills, cough, or new shortness of breath).    Past Medical History:  Diagnosis Date  . Anxiety   . Depression   . Dysrhythmia   . GERD (gastroesophageal reflux disease)   . Heart murmur   . History of colon polyps   . History of kidney stones   . Hypertension   . Kidney stones   . Myocardial infarction ([place])   . Parkinson disease ([place])   . Parkinson's disease [facility])    Past Surgical History:  Procedure Laterality Date  . anal Energy manager    . CARDIAC CATHETERIZATION  07/11/2012   high graded stenoses small ift circ and small 1st diag   which were  left for medicl therapy, lv systoilc fx preserved  . CORONARY ANGIOPLASTY  07/11/2012   90% and 70% lesions RCA  3.0 x32 mm Promus DES  . CORONARY STENT PLACEMENT  07/11/12  . EXTRACORPOREAL SHOCK WAVE LITHOTRIPSY Left 04/22/2018   Procedure: LEFT EXTRACORPOREAL SHOCK WAVE LITHOTRIPSY (ESWL);  Surgeon: Cleon Gustin, MD;  Location: [facility] ORS;  Service: Urology;  Laterality: Left;  . EXTRACORPOREAL SHOCK WAVE LITHOTRIPSY Left 05/31/2018   Procedure: LEFT EXTRACORPOREAL SHOCK WAVE LITHOTRIPSY (ESWL);  Surgeon: Alexis Frock, MD;  Location: [facility] ORS;  Service: Urology;  Laterality: Left;  [phone] [employer] [id number]  . LEFT HEART CATH N/A 07/11/2012   Procedure: LEFT HEART CATH;  Surgeon: Sanda Klein, MD;  Location: [facility] CATH LAB;  Service: Cardiovascular;  Laterality: N/A;  . PERCUTANEOUS CORONARY STENT INTERVENTION (PCI-S) Right 07/11/2012   Procedure: PERCUTANEOUS CORONARY STENT INTERVENTION (PCI-S);  Surgeon: Sanda Klein, MD;  Location: [facility] CATH LAB;  Service:  Cardiovascular;  Laterality: Right;     Current Meds  Medication Sig  . aspirin 81 MG tablet Take 81 mg by mouth at bedtime.   Marland Kitchen atorvastatin (LIPITOR) 80 MG tablet TAKE (1) TABLET BY MOUTH AT BEDTIME.  . Calcium-Vitamin D 600-125 MG-UNIT TABS Take 1 tablet by mouth daily.   . carbidopa-levodopa (SINEMET IR) 25-100 MG tablet TAKE 2 TABLETS BY MOUTH THREE TIMES DAILY.  . citalopram (CELEXA) 40 MG tablet TAKE ONE TABLET BY MOUTH DAILY.  Marland Kitchen esomeprazole (NEXIUM) 20 MG capsule Take 20 mg by mouth daily at 12 noon.  . famotidine (PEPCID) 20 MG tablet Take 1 tablet (20 mg total) by mouth 2 (two) times daily.  . isosorbide mononitrate (IMDUR) 60 MG 24 hr tablet Take 1 tablet (60 mg total) by mouth daily. KEEP OV.  Marland Kitchen lisinopril (PRINIVIL,ZESTRIL) 20 MG tablet TAKE (1) TABLET BY MOUTH ONCE DAILY.  Marland Kitchen ondansetron (ZOFRAN ODT) 4 MG disintegrating tablet 4mg  ODT q4 hours prn nausea/vomit  . oxyCODONE-acetaminophen (PERCOCET/ROXICET) 5-325 MG tablet Take 1 tablet by mouth every 6 (six) hours as needed.  . ranitidine (ZANTAC) 150 MG capsule Take 150 mg by mouth 2 (two) times daily.  . tamsulosin (FLOMAX) 0.4 MG CAPS capsule Take 1 capsule (0.4 mg total) by mouth daily.  . [DISCONTINUED] nitroGLYCERIN (NITROLINGUAL) 0.4 MG/SPRAY spray Place 1 spray under the tongue every 5 (five) minutes x 3 doses as needed for chest pain.     Allergies:   Codeine   Social History   Tobacco Use  . Smoking status: Former Smoker    Packs/day: 1.00    Years: 20.00    Pack years: 20.00    Types: Cigarettes    Quit date: 11/10/1996    Years since quitting: 22.5  . Smokeless tobacco: Never Used  Substance Use Topics  . Alcohol use: No    Alcohol/week: 0.0 standard drinks  . Drug use: Yes    Types: Marijuana    Comment: not using, uses CBD oil now- 04/21/18     Family Hx: The patient's family history is not on file. He was adopted.  ROS:   Please see the history of present illness.     All other systems reviewed  and are negative.   Prior CV studies:   The following studies were reviewed today: Nuclear stress test 2017  Labs/Other Tests and Data Reviewed:    EKG:  An ECG dated 08/12/2018 was personally reviewed today and demonstrated:  Sinus bradycardia, otherwise normal tracing  Recent Labs: No results found for requested labs within last 8760 hours.   Recent Lipid Panel Lab Results  Component Value Date/Time   CHOL 160 04/08/2018 10:52 AM   TRIG 161 (H) 04/08/2018 10:52 AM   HDL 39 (L) 04/08/2018 10:52 AM   CHOLHDL 4.1 04/08/2018 10:52 AM   CHOLHDL 4.7 02/12/2016 10:00 AM   LDLCALC 89 04/08/2018 10:52 AM    Wt Readings from Last 3 Encounters:  05/12/19 190 lb (86.2 kg)  10/12/18 205 lb (93 kg)  08/12/18 201 lb 12.8 oz (91.5 kg)     Objective:    Vital  Signs:  BP (!) 153/98   Pulse 67   Ht 6' (1.829 m)   Wt 190 lb (86.2 kg)   BMI 25.77 kg/m    VITAL SIGNS:  reviewed GEN:  no acute distress EYES:  sclerae anicteric, EOMI - Extraocular Movements Intact RESPIRATORY:  normal respiratory effort, symmetric expansion CARDIOVASCULAR:  no peripheral edema SKIN:  no rash, lesions or ulcers. MUSCULOSKELETAL:  no obvious deformities. NEURO:  alert and oriented x 3, no obvious focal deficit PSYCH:  normal affect  ASSESSMENT & PLAN:    1. Angina pectoris, possibly unstable angina: He describes his chest discomfort as very similar to what he experienced in 2013.  Although most of the symptoms occur with activity, there appears to be a significant pattern of crescendo angina and his wife reports that he has had some symptoms at rest early in the morning.  We will give him a fresh prescription for sublingual nitroglycerin and will add another 30 mg of isosorbide mononitrate just before bedtime.  Add amlodipine 5 mg daily.  Repeatedly advised him that he should immediately seek urgent medical care if he has symptoms that persist for 30 minutes or longer, not relieved by 3 consecutive  sublingual nitroglycerin tablets.  We will try to set him up as soon as possible for coronary angiography and possible angioplasty/stent placement.  I do not think performing a functional study would be the right approach, especially with the current coronavirus restrictions. This procedure has been fully reviewed with the patient and written informed consent has been obtained. We may not find coronary lesions that are amenable to revascularization, judging by his previous angiograms. We will give him a fresh prescription for sublingual nitroglycerin and will add another 30 mg of isosorbide mononitrate just before bedtime.  Add amlodipine 5 mg daily.  Beta-blocker intolerant due to bradycardia. 2. HTN: His blood pressure is quite high today, but this is unusual for him (usually blood pressure is around 130/80).  This may be related to anxiety.  We will add amlodipine 5 mg once daily primarily as an antianginal.   It is possible that he will not tolerate this since he has had some issues with orthostatic hypotension since the diagnosis of Parkinson's disease. 3. HLP: LDL is not at target despite maximum dose atorvastatin.  Add ezetimibe 10 mg daily.  Lipid profile is also significant for low HDL and high triglycerides, consistent with a metabolic syndrome and likely an unfavorable LDL particle profile. 4. PreDM: He has had a couple of blood sugars in the diabetes range last year (judging by the time they were drawn probably not fasting), but his most recent hemoglobin A1c was 5.9%.  COVID-19 Education: The signs and symptoms of COVID-19 were discussed with the patient and how to seek care for testing (follow up with PCP or arrange E-visit).  The importance of social distancing was discussed today.  Time:   Today, I have spent 29 minutes with the patient with telehealth technology discussing the above problems.     Medication Adjustments/Labs and Tests Ordered: Current medicines are reviewed at length  with the patient today.  Concerns regarding medicines are outlined above.   Tests Ordered: No orders of the defined types were placed in this encounter.   Medication Changes: No orders of the defined types were placed in this encounter.  Patient Instructions  Medication Instructions:  START Amlodipine 5 mg once daily CHANGE how you take the Isosorbide (Imdur) to 60 mg in the morning and 30 mg  at bedtime START Ezetimibe (Zetia) 10 mg once daily Nitroglycerin has   If you need a refill on your cardiac medications before your next appointment, please call your pharmacy.   Lab work: Your provider would like for you to have the following labs today: BMET, Lipid, A1C and CBC  If you have labs (blood work) drawn today and your tests are completely normal, you will receive your results only by: Marland Kitchen MyChart Message (if you have MyChart) OR . A paper copy in the mail If you have any lab test that is abnormal or we need to change your treatment, we will call you to review the results.  Testing/Procedures: Your physician has requested that you have a cardiac catheterization. Cardiac catheterization is used to diagnose and/or treat various heart conditions. Doctors may recommend this procedure for a number of different reasons. The most common reason is to evaluate chest pain. Chest pain can be a symptom of coronary artery disease (CAD), and cardiac catheterization can show whether plaque is narrowing or blocking your heart's arteries. This procedure is also used to evaluate the valves, as well as measure the blood flow and oxygen levels in different parts of your heart. For further information please visit HugeFiesta.tn. Please follow instruction sheet, as given.    Follow-Up: At [facility], you and your health needs are our priority.  As part of our continuing mission to provide you with exceptional heart care, we have created designated Provider Care Teams.  These Care Teams include your  primary Cardiologist (physician) and Advanced Practice Providers (APPs -  Physician Assistants and Nurse Practitioners) who all work together to provide you with the care you need, when you need it. You will need a follow up appointment in one month. You may see Sanda Klein, MD or one of the following Advanced Practice Providers on your designated Care Team: [place], [place] . Fabian Sharp, PA-C  Any Other Special Instructions Will Be Listed Below (If Applicable).    [place] [address] Dept: [phone] Loc: [phone]  Christopher Weeks  05/12/2019  You are scheduled for a Cardiac Catheterization on Monday, July 2 with Dr. Glenetta Hew.  1. Please arrive at the [facility] (Main Entrance A) at [facility]: [address] [place], [place] [address] at 7:00 AM (This time is two hours before your procedure to ensure your preparation). Free valet parking service is available.   Special note: Every effort is made to have your procedure done on time. Please understand that emergencies sometimes delay scheduled procedures.  2. Diet: Do not eat solid foods after midnight.  The patient may have clear liquids until 5am upon the day of the procedure.  3. Labs: You will need to have blood drawn today at the [facility]. You do not need to be fasting.  4. Medication instructions in preparation for your procedure:   On the morning of your procedure, take your Aspirin and any morning medicines NOT listed above.  You may use sips of water.  5. Plan for one night stay--bring personal belongings. 6. Bring a current list of your medications and current insurance cards. 7. You MUST have a responsible person to drive you home. 8. Someone MUST be with you the first 24 hours after you arrive home or your discharge will be delayed. 9. Please wear clothes that are easy to get on and off  and wear slip-on shoes.  Thank you for allowing [place] to care for you!   -- [place] [facility]      Follow Up:  Virtual Visit or In Person 3 months  Signed, Sanda Klein, MD  05/12/2019 10:16 AM    [place]

## 2019-05-12 NOTE — Patient Instructions (Addendum)
Medication Instructions:  START Amlodipine 5 mg once daily CHANGE how you take the Isosorbide (Imdur) to 60 mg in the morning and 30 mg (half a tablet) at bedtime START Ezetimibe (Zetia) 10 mg once daily A refill of Nitroglycerin has sent in for you.  If you need a refill on your cardiac medications before your next appointment, please call your pharmacy.   Lab work: Your provider would like for you to have the following labs today: BMET, Lipid, A1C and CBC  If you have labs (blood work) drawn today and your tests are completely normal, you will receive your results only by: Marland Kitchen MyChart Message (if you have MyChart) OR . A paper copy in the mail If you have any lab test that is abnormal or we need to change your treatment, we will call you to review the results.  Testing/Procedures: Your physician has requested that you have a cardiac catheterization. Cardiac catheterization is used to diagnose and/or treat various heart conditions. Doctors may recommend this procedure for a number of different reasons. The most common reason is to evaluate chest pain. Chest pain can be a symptom of coronary artery disease (CAD), and cardiac catheterization can show whether plaque is narrowing or blocking your heart's arteries. This procedure is also used to evaluate the valves, as well as measure the blood flow and oxygen levels in different parts of your heart. For further information please visit HugeFiesta.tn. Please follow instruction sheet, as given.    Follow-Up: At [facility], you and your health needs are our priority.  As part of our continuing mission to provide you with exceptional heart care, we have created designated Provider Care Teams.  These Care Teams include your primary Cardiologist (physician) and Advanced Practice Providers (APPs -  Physician Assistants and Nurse Practitioners) who all work together to provide you with the care you need, when you need it. You will need a follow  up appointment in one month. You may see Sanda Klein, MD or one of the following Advanced Practice Providers on your designated Care Team: [place], [place] . Fabian Sharp, PA-C  Any Other Special Instructions Will Be Listed Below (If Applicable).    [name] [address] Dept: [phone] Loc: [phone]  Christopher Weeks  05/12/2019  You are scheduled for a Cardiac Catheterization on Monday, July 6 with Dr. Glenetta Hew.  1. Please arrive at the [facility] (Main Entrance A) at [facility]: [address] [place], [place] [address] at 7:00 AM (This time is two hours before your procedure to ensure your preparation). Free valet parking service is available.   Special note: Every effort is made to have your procedure done on time. Please understand that emergencies sometimes delay scheduled procedures.  2. Diet: Do not eat solid foods after midnight.  The patient may have clear liquids until 5am upon the day of the procedure.  3. Labs: You will need to have blood drawn today (05/12/2019)at the [facility]. You do not need to be fasting.  You will need to have the coronavirus test completed prior to your procedure. An appointment has been made at 3:20 pm on 05/12/2019. This will be completed at [facility]. This is a drive thru test only. Please make sure to have all other labs completed before this test because you will need to stay quarantined until your procedure.   4. Medication instructions in  preparation for your procedure: None to hold  On the morning of your procedure, take your Aspirin and any morning medicines NOT listed above.  You may use sips of water.  5. Plan for one night stay--bring personal belongings. 6. Bring a current list of your medications and current insurance cards. 7. You MUST have a responsible person to drive you  home. 8. Someone MUST be with you the first 24 hours after you arrive home or your discharge will be delayed. 9. Please wear clothes that are easy to get on and off and wear slip-on shoes.  Thank you for allowing [place] to care for you!   -- [place] [facility]

## 2019-05-12 NOTE — Telephone Encounter (Signed)
The patient and his family have been called with instructions from the virtual visit today and for the cath scheduled for Monday 05/16/2019.  He will come by the office today for lab work and the printed instructions.  He has been advised that he will need to wear a mask and come alone or have his family wait in the car.  You are scheduled for a Cardiac Catheterization on Monday, July 6 with Dr. Glenetta Hew.  1. Please arrive at the [facility] (Main Entrance A) at [facility]: [address] [place], [place] [address] at 7:00 AM (This time is two hours before your procedure to ensure your preparation). Free valet parking service is available.   2. Diet: Do not eat solid foods after midnight.  The patient may have clear liquids until 5am upon the day of the procedure.  3. Labs: You will need to have blood drawn today (05/12/2019)at the [facility]. You do not need to be fasting.  You will need to have the coronavirus test completed prior to your procedure. An appointment has been made at 3:20 pm on 05/12/2019. This will be completed at [facility]. This is a drive thru test only. Please make sure to have all other labs completed before this test because you will need to stay quarantined until your procedure.      COVID-19 Pre-Screening Questions:  . In the past 7 to 10 days have you had a cough,  shortness of breath, headache, congestion, fever (100 or greater) body aches, chills, sore throat, or sudden loss of taste or sense of smell? No . Have you been around anyone with known Covid 19. No . Have you been around anyone who is awaiting Covid 19 test results in the past 7 to 10 days? No . Have you been around anyone who has been exposed to Covid 19, or has mentioned symptoms of Covid 19 within the past 7 to 10 days? No  If you have any concerns/questions about symptoms patients report during screening (either on the phone or at [facility]). Contact the provider  seeing the patient or DOD for further guidance.  If neither are available contact a member of the leadership team.

## 2019-05-13 LAB — BASIC METABOLIC PANEL
BUN/Creatinine Ratio: 13 (ref 10–24)
BUN: 14 mg/dL (ref 8–27)
CO2: 25 mmol/L (ref 20–29)
Calcium: 10 mg/dL (ref 8.6–10.2)
Chloride: 98 mmol/L (ref 96–106)
Creatinine, Ser: 1.05 mg/dL (ref 0.76–1.27)
GFR calc Af Amer: 85 mL/min/{1.73_m2} (ref >59)
GFR calc non Af Amer: 74 mL/min/{1.73_m2} (ref >59)
Glucose: 119 mg/dL — ABNORMAL HIGH (ref 65–99)
Potassium: 4.4 mmol/L (ref 3.5–5.2)
Sodium: 141 mmol/L (ref 134–144)

## 2019-05-13 LAB — LIPID PANEL
Chol/HDL Ratio: 4 ratio (ref 0.0–5.0)
Cholesterol, Total: 135 mg/dL (ref 100–199)
HDL: 34 mg/dL — ABNORMAL LOW (ref >39)
LDL Calculated: 73 mg/dL (ref 0–99)
Triglycerides: 138 mg/dL (ref 0–149)
VLDL Cholesterol Cal: 28 mg/dL (ref 5–40)

## 2019-05-13 LAB — CBC
Hematocrit: 44.3 % (ref 37.5–51.0)
Hemoglobin: 15.1 g/dL (ref 13.0–17.7)
MCH: 30.8 pg (ref 26.6–33.0)
MCHC: 34.1 g/dL (ref 31.5–35.7)
MCV: 90 fL (ref 79–97)
Platelets: 244 10*3/uL (ref 150–450)
RBC: 4.9 x10E6/uL (ref 4.14–5.80)
RDW: 12.7 % (ref 11.6–15.4)
WBC: 8.2 10*3/uL (ref 3.4–10.8)

## 2019-05-13 LAB — HEMOGLOBIN A1C
Est. average glucose Bld gHb Est-mCnc: 126 mg/dL
Hgb A1c MFr Bld: 6 % — ABNORMAL HIGH (ref 4.8–5.6)

## 2019-05-16 ENCOUNTER — Emergency Department (HOSPITAL_COMMUNITY): Payer: PPO

## 2019-05-16 ENCOUNTER — Other Ambulatory Visit: Payer: Self-pay

## 2019-05-16 ENCOUNTER — Ambulatory Visit (HOSPITAL_COMMUNITY): Admission: RE | Admit: 2019-05-16 | Payer: PPO | Source: Home / Self Care | Admitting: Cardiology

## 2019-05-16 ENCOUNTER — Ambulatory Visit (HOSPITAL_COMMUNITY): Payer: PPO

## 2019-05-16 ENCOUNTER — Ambulatory Visit (HOSPITAL_COMMUNITY)
Admission: EM | Disposition: A | Discharge status: Home / Self Care | Payer: Self-pay | Source: Home / Self Care | Attending: Emergency Medicine

## 2019-05-16 ENCOUNTER — Encounter (HOSPITAL_COMMUNITY): Payer: Self-pay

## 2019-05-16 ENCOUNTER — Ambulatory Visit (HOSPITAL_COMMUNITY)
Admission: EM | Admit: 2019-05-16 | Discharge: 2019-05-17 | Disposition: A | Discharge status: Home / Self Care | Payer: PPO | Attending: Cardiology | Admitting: Cardiology

## 2019-05-16 DIAGNOSIS — F419 Anxiety disorder, unspecified: Secondary | ICD-10-CM | POA: Diagnosis not present

## 2019-05-16 DIAGNOSIS — I10 Essential (primary) hypertension: Secondary | ICD-10-CM | POA: Insufficient documentation

## 2019-05-16 DIAGNOSIS — Z955 Presence of coronary angioplasty implant and graft: Secondary | ICD-10-CM | POA: Diagnosis not present

## 2019-05-16 DIAGNOSIS — I200 Unstable angina: Secondary | ICD-10-CM | POA: Diagnosis present

## 2019-05-16 DIAGNOSIS — Z7982 Long term (current) use of aspirin: Secondary | ICD-10-CM | POA: Diagnosis not present

## 2019-05-16 DIAGNOSIS — R51 Headache: Secondary | ICD-10-CM | POA: Diagnosis not present

## 2019-05-16 DIAGNOSIS — I252 Old myocardial infarction: Secondary | ICD-10-CM | POA: Diagnosis not present

## 2019-05-16 DIAGNOSIS — G20 Parkinson's disease: Secondary | ICD-10-CM | POA: Diagnosis not present

## 2019-05-16 DIAGNOSIS — I25110 Atherosclerotic heart disease of native coronary artery with unstable angina pectoris: Secondary | ICD-10-CM

## 2019-05-16 DIAGNOSIS — I959 Hypotension, unspecified: Secondary | ICD-10-CM | POA: Diagnosis not present

## 2019-05-16 DIAGNOSIS — I700 Atherosclerosis of aorta: Secondary | ICD-10-CM | POA: Diagnosis not present

## 2019-05-16 DIAGNOSIS — Z79899 Other long term (current) drug therapy: Secondary | ICD-10-CM | POA: Diagnosis not present

## 2019-05-16 DIAGNOSIS — E785 Hyperlipidemia, unspecified: Secondary | ICD-10-CM | POA: Insufficient documentation

## 2019-05-16 DIAGNOSIS — R079 Chest pain, unspecified: Secondary | ICD-10-CM | POA: Diagnosis not present

## 2019-05-16 DIAGNOSIS — I25118 Atherosclerotic heart disease of native coronary artery with other forms of angina pectoris: Secondary | ICD-10-CM | POA: Diagnosis present

## 2019-05-16 DIAGNOSIS — R7303 Prediabetes: Secondary | ICD-10-CM | POA: Insufficient documentation

## 2019-05-16 DIAGNOSIS — R072 Precordial pain: Secondary | ICD-10-CM

## 2019-05-16 DIAGNOSIS — F329 Major depressive disorder, single episode, unspecified: Secondary | ICD-10-CM | POA: Insufficient documentation

## 2019-05-16 DIAGNOSIS — K219 Gastro-esophageal reflux disease without esophagitis: Secondary | ICD-10-CM | POA: Insufficient documentation

## 2019-05-16 DIAGNOSIS — Z885 Allergy status to narcotic agent status: Secondary | ICD-10-CM | POA: Diagnosis not present

## 2019-05-16 DIAGNOSIS — Y92410 Unspecified street and highway as the place of occurrence of the external cause: Secondary | ICD-10-CM | POA: Diagnosis not present

## 2019-05-16 DIAGNOSIS — Z87891 Personal history of nicotine dependence: Secondary | ICD-10-CM | POA: Diagnosis not present

## 2019-05-16 DIAGNOSIS — I2510 Atherosclerotic heart disease of native coronary artery without angina pectoris: Secondary | ICD-10-CM

## 2019-05-16 HISTORY — DX: Unstable angina: I20.0

## 2019-05-16 HISTORY — PX: CORONARY BALLOON ANGIOPLASTY: CATH118233

## 2019-05-16 HISTORY — PX: LEFT HEART CATH AND CORONARY ANGIOGRAPHY: CATH118249

## 2019-05-16 LAB — CBC WITH DIFFERENTIAL/PLATELET
Abs Immature Granulocytes: 0.03 10*3/uL (ref 0.00–0.07)
Basophils Absolute: 0 10*3/uL (ref 0.0–0.1)
Basophils Relative: 0 %
Eosinophils Absolute: 0 10*3/uL (ref 0.0–0.5)
Eosinophils Relative: 0 %
HCT: 45.1 % (ref 39.0–52.0)
Hemoglobin: 15.5 g/dL (ref 13.0–17.0)
Immature Granulocytes: 0 %
Lymphocytes Relative: 9 %
Lymphs Abs: 1 10*3/uL (ref 0.7–4.0)
MCH: 31.9 pg (ref 26.0–34.0)
MCHC: 34.4 g/dL (ref 30.0–36.0)
MCV: 92.8 fL (ref 80.0–100.0)
Monocytes Absolute: 0.6 10*3/uL (ref 0.1–1.0)
Monocytes Relative: 5 %
Neutro Abs: 9.4 10*3/uL — ABNORMAL HIGH (ref 1.7–7.7)
Neutrophils Relative %: 86 %
Platelets: 245 10*3/uL (ref 150–400)
RBC: 4.86 MIL/uL (ref 4.22–5.81)
RDW: 12.4 % (ref 11.5–15.5)
WBC: 11 10*3/uL — ABNORMAL HIGH (ref 4.0–10.5)
nRBC: 0 % (ref 0.0–0.2)

## 2019-05-16 LAB — BASIC METABOLIC PANEL
Anion gap: 14 (ref 5–15)
BUN: 16 mg/dL (ref 8–23)
CO2: 25 mmol/L (ref 22–32)
Calcium: 9.9 mg/dL (ref 8.9–10.3)
Chloride: 101 mmol/L (ref 98–111)
Creatinine, Ser: 1.12 mg/dL (ref 0.61–1.24)
GFR calc Af Amer: >60 mL/min (ref >60)
GFR calc non Af Amer: >60 mL/min (ref >60)
Glucose, Bld: 149 mg/dL — ABNORMAL HIGH (ref 70–99)
Potassium: 4.5 mmol/L (ref 3.5–5.1)
Sodium: 140 mmol/L (ref 135–145)

## 2019-05-16 LAB — GLUCOSE, CAPILLARY: Glucose-Capillary: 135 mg/dL — ABNORMAL HIGH (ref 70–99)

## 2019-05-16 LAB — POCT ACTIVATED CLOTTING TIME: Activated Clotting Time: 488 seconds

## 2019-05-16 LAB — TROPONIN I (HIGH SENSITIVITY)
Troponin I (High Sensitivity): 13 ng/L (ref <18)
Troponin I (High Sensitivity): 21 ng/L — ABNORMAL HIGH (ref <18)

## 2019-05-16 LAB — MRSA PCR SCREENING: MRSA by PCR: NEGATIVE

## 2019-05-16 SURGERY — LEFT HEART CATH AND CORONARY ANGIOGRAPHY
Anesthesia: LOCAL

## 2019-05-16 MED ORDER — FAMOTIDINE 20 MG PO TABS
20.0000 mg | ORAL_TABLET | Freq: Two times a day (BID) | ORAL | Status: DC
  Administered 2019-05-16 – 2019-05-17 (×2): 20 mg via ORAL
  Filled 2019-05-16 (×2): qty 1

## 2019-05-16 MED ORDER — SODIUM CHLORIDE 0.9 % WEIGHT BASED INFUSION
1.0000 mL/kg/h | INTRAVENOUS | Status: DC
  Administered 2019-05-16: 1 mL/kg/h via INTRAVENOUS

## 2019-05-16 MED ORDER — BIVALIRUDIN TRIFLUOROACETATE 250 MG IV SOLR
INTRAVENOUS | Status: AC
  Filled 2019-05-16: qty 250

## 2019-05-16 MED ORDER — ALUM & MAG HYDROXIDE-SIMETH 200-200-20 MG/5ML PO SUSP
30.0000 mL | Freq: Once | ORAL | Status: AC
  Administered 2019-05-16: 30 mL via ORAL
  Filled 2019-05-16: qty 30

## 2019-05-16 MED ORDER — LISINOPRIL 20 MG PO TABS
20.0000 mg | ORAL_TABLET | Freq: Every day | ORAL | Status: DC
  Administered 2019-05-16: 20 mg via ORAL
  Filled 2019-05-16: qty 1

## 2019-05-16 MED ORDER — PROMETHAZINE HCL 25 MG/ML IJ SOLN: 25.0000 mg | Freq: Once | INTRAMUSCULAR | Status: DC

## 2019-05-16 MED ORDER — CALCIUM-VITAMIN D 600-125 MG-UNIT PO TABS: 1.0000 | ORAL_TABLET | Freq: Every day | ORAL | Status: DC

## 2019-05-16 MED ORDER — HEPARIN (PORCINE) IN NACL 1000-0.9 UT/500ML-% IV SOLN
INTRAVENOUS | Status: AC
  Filled 2019-05-16: qty 1000

## 2019-05-16 MED ORDER — AMLODIPINE BESYLATE 5 MG PO TABS
5.0000 mg | ORAL_TABLET | Freq: Every day | ORAL | Status: DC
  Administered 2019-05-16: 5 mg via ORAL
  Filled 2019-05-16: qty 1

## 2019-05-16 MED ORDER — HEPARIN SODIUM (PORCINE) 1000 UNIT/ML IJ SOLN
INTRAMUSCULAR | Status: DC | PRN
  Administered 2019-05-16: 4000 [IU] via INTRAVENOUS

## 2019-05-16 MED ORDER — VERAPAMIL HCL 2.5 MG/ML IV SOLN
INTRAVENOUS | Status: AC
  Filled 2019-05-16: qty 2

## 2019-05-16 MED ORDER — SODIUM CHLORIDE 0.9 % IV SOLN
1.7500 mg/kg/h | INTRAVENOUS | Status: AC
  Administered 2019-05-16: 1.75 mg/kg/h via INTRAVENOUS
  Filled 2019-05-16: qty 250

## 2019-05-16 MED ORDER — IOHEXOL 350 MG/ML SOLN
INTRAVENOUS | Status: DC | PRN
  Administered 2019-05-16: 195 mL

## 2019-05-16 MED ORDER — CARBIDOPA-LEVODOPA 25-100 MG PO TABS
2.0000 | ORAL_TABLET | Freq: Three times a day (TID) | ORAL | Status: DC
  Administered 2019-05-16 – 2019-05-17 (×3): 2 via ORAL
  Filled 2019-05-16 (×6): qty 2

## 2019-05-16 MED ORDER — MIDAZOLAM HCL 2 MG/2ML IJ SOLN
INTRAMUSCULAR | Status: DC | PRN
  Administered 2019-05-16: 1 mg via INTRAVENOUS
  Administered 2019-05-16: 2 mg via INTRAVENOUS

## 2019-05-16 MED ORDER — NITROGLYCERIN 1 MG/10 ML FOR IR/CATH LAB
INTRA_ARTERIAL | Status: DC | PRN
  Administered 2019-05-16: 150 ug via INTRACORONARY
  Administered 2019-05-16 (×3): 200 ug via INTRACORONARY

## 2019-05-16 MED ORDER — SODIUM CHLORIDE 0.9% FLUSH: 3.0000 mL | INTRAVENOUS | Status: DC | PRN

## 2019-05-16 MED ORDER — ASPIRIN 81 MG PO CHEW: 81.0000 mg | CHEWABLE_TABLET | ORAL | Status: DC

## 2019-05-16 MED ORDER — FENTANYL CITRATE (PF) 100 MCG/2ML IJ SOLN
INTRAMUSCULAR | Status: DC | PRN
  Administered 2019-05-16 (×4): 25 ug via INTRAVENOUS

## 2019-05-16 MED ORDER — MIDAZOLAM HCL 2 MG/2ML IJ SOLN
INTRAMUSCULAR | Status: AC
  Filled 2019-05-16: qty 2

## 2019-05-16 MED ORDER — SODIUM CHLORIDE 0.9% FLUSH
3.0000 mL | Freq: Two times a day (BID) | INTRAVENOUS | Status: DC
  Administered 2019-05-16 (×2): 3 mL via INTRAVENOUS

## 2019-05-16 MED ORDER — NITROGLYCERIN 0.4 MG SL SUBL
SUBLINGUAL_TABLET | SUBLINGUAL | Status: AC
  Filled 2019-05-16: qty 1

## 2019-05-16 MED ORDER — ONDANSETRON HCL 4 MG/2ML IJ SOLN
INTRAMUSCULAR | Status: AC
  Filled 2019-05-16: qty 2

## 2019-05-16 MED ORDER — FENTANYL CITRATE (PF) 100 MCG/2ML IJ SOLN
INTRAMUSCULAR | Status: AC
  Filled 2019-05-16: qty 2

## 2019-05-16 MED ORDER — CLOPIDOGREL BISULFATE 300 MG PO TABS
ORAL_TABLET | ORAL | Status: DC | PRN
  Administered 2019-05-16: 600 mg via ORAL

## 2019-05-16 MED ORDER — ISOSORBIDE MONONITRATE ER 60 MG PO TB24
60.0000 mg | ORAL_TABLET | Freq: Every day | ORAL | Status: DC
  Administered 2019-05-16 – 2019-05-17 (×2): 60 mg via ORAL
  Filled 2019-05-16 (×2): qty 1

## 2019-05-16 MED ORDER — FAMOTIDINE IN NACL 20-0.9 MG/50ML-% IV SOLN: 20.0000 mg | Freq: Once | INTRAVENOUS | Status: DC

## 2019-05-16 MED ORDER — SODIUM CHLORIDE 0.9 % IV SOLN: 250.0000 mL | INTRAVENOUS | Status: DC | PRN

## 2019-05-16 MED ORDER — HEPARIN (PORCINE) IN NACL 1000-0.9 UT/500ML-% IV SOLN
INTRAVENOUS | Status: DC | PRN
  Administered 2019-05-16 (×3): 500 mL

## 2019-05-16 MED ORDER — CITALOPRAM HYDROBROMIDE 20 MG PO TABS
40.0000 mg | ORAL_TABLET | Freq: Every day | ORAL | Status: DC
  Administered 2019-05-16 – 2019-05-17 (×2): 40 mg via ORAL
  Filled 2019-05-16 (×2): qty 2
  Filled 2019-05-16: qty 1

## 2019-05-16 MED ORDER — CLOPIDOGREL BISULFATE 300 MG PO TABS
ORAL_TABLET | ORAL | Status: AC
  Filled 2019-05-16: qty 1

## 2019-05-16 MED ORDER — SODIUM CHLORIDE 0.9 % WEIGHT BASED INFUSION
3.0000 mL/kg/h | INTRAVENOUS | Status: DC
  Administered 2019-05-16: 3 mL/kg/h via INTRAVENOUS

## 2019-05-16 MED ORDER — ALUM & MAG HYDROXIDE-SIMETH 200-200-20 MG/5 ML NICU TOPICAL: 1.0000 "application " | Freq: Once | TOPICAL | Status: DC

## 2019-05-16 MED ORDER — LABETALOL HCL 5 MG/ML IV SOLN
10.0000 mg | INTRAVENOUS | Status: AC | PRN
  Administered 2019-05-16: 10 mg via INTRAVENOUS
  Filled 2019-05-16: qty 4

## 2019-05-16 MED ORDER — LABETALOL HCL 5 MG/ML IV SOLN
INTRAVENOUS | Status: DC | PRN
  Administered 2019-05-16: 10 mg via INTRAVENOUS

## 2019-05-16 MED ORDER — SODIUM CHLORIDE 0.9 % IV SOLN
INTRAVENOUS | Status: AC
  Administered 2019-05-16: 21:00:00 via INTRAVENOUS

## 2019-05-16 MED ORDER — VERAPAMIL HCL 2.5 MG/ML IV SOLN
INTRAVENOUS | Status: DC | PRN
  Administered 2019-05-16: 10 mL via INTRA_ARTERIAL

## 2019-05-16 MED ORDER — FAMOTIDINE IN NACL 20-0.9 MG/50ML-% IV SOLN
INTRAVENOUS | Status: AC
  Filled 2019-05-16: qty 50

## 2019-05-16 MED ORDER — LIDOCAINE HCL (PF) 1 % IJ SOLN
INTRAMUSCULAR | Status: DC | PRN
  Administered 2019-05-16: 2 mL

## 2019-05-16 MED ORDER — BIVALIRUDIN BOLUS VIA INFUSION - CUPID
INTRAVENOUS | Status: DC | PRN
  Administered 2019-05-16: 64.65 mg via INTRAVENOUS

## 2019-05-16 MED ORDER — CALCIUM CARBONATE-VITAMIN D 500-200 MG-UNIT PO TABS
1.0000 | ORAL_TABLET | Freq: Every day | ORAL | Status: DC
  Administered 2019-05-16 – 2019-05-17 (×2): 1 via ORAL
  Filled 2019-05-16 (×2): qty 1

## 2019-05-16 MED ORDER — HYDRALAZINE HCL 20 MG/ML IJ SOLN: 10.0000 mg | INTRAMUSCULAR | Status: AC | PRN

## 2019-05-16 MED ORDER — EZETIMIBE 10 MG PO TABS
10.0000 mg | ORAL_TABLET | Freq: Every day | ORAL | Status: DC
  Administered 2019-05-16 – 2019-05-17 (×2): 10 mg via ORAL
  Filled 2019-05-16 (×2): qty 1

## 2019-05-16 MED ORDER — HEPARIN SODIUM (PORCINE) 5000 UNIT/ML IJ SOLN
5000.0000 [IU] | Freq: Three times a day (TID) | INTRAMUSCULAR | Status: DC
  Administered 2019-05-16 – 2019-05-17 (×2): 5000 [IU] via SUBCUTANEOUS
  Filled 2019-05-16 (×2): qty 1

## 2019-05-16 MED ORDER — LIDOCAINE HCL (PF) 1 % IJ SOLN
INTRAMUSCULAR | Status: AC
  Filled 2019-05-16: qty 30

## 2019-05-16 MED ORDER — NITROGLYCERIN 0.4 MG SL SUBL: 0.4000 mg | SUBLINGUAL_TABLET | SUBLINGUAL | Status: DC | PRN

## 2019-05-16 MED ORDER — HEPARIN SODIUM (PORCINE) 1000 UNIT/ML IJ SOLN
INTRAMUSCULAR | Status: AC
  Filled 2019-05-16: qty 1

## 2019-05-16 MED ORDER — ASPIRIN EC 81 MG PO TBEC
81.0000 mg | DELAYED_RELEASE_TABLET | Freq: Every day | ORAL | Status: DC
  Administered 2019-05-17: 11:00:00 81 mg via ORAL
  Filled 2019-05-16: qty 1

## 2019-05-16 MED ORDER — SODIUM CHLORIDE 0.9% FLUSH: 3.0000 mL | Freq: Two times a day (BID) | INTRAVENOUS | Status: DC

## 2019-05-16 MED ORDER — HEPARIN (PORCINE) IN NACL 1000-0.9 UT/500ML-% IV SOLN
INTRAVENOUS | Status: AC
  Filled 2019-05-16: qty 500

## 2019-05-16 MED ORDER — CLOPIDOGREL BISULFATE 75 MG PO TABS
75.0000 mg | ORAL_TABLET | Freq: Every day | ORAL | Status: DC
  Administered 2019-05-17: 75 mg via ORAL
  Filled 2019-05-16: qty 1

## 2019-05-16 MED ORDER — ONDANSETRON HCL 4 MG/2ML IJ SOLN
4.0000 mg | Freq: Four times a day (QID) | INTRAMUSCULAR | Status: DC | PRN
  Administered 2019-05-16: 4 mg via INTRAVENOUS

## 2019-05-16 MED ORDER — ACETAMINOPHEN 325 MG PO TABS
650.0000 mg | ORAL_TABLET | ORAL | Status: DC | PRN
  Administered 2019-05-16: 650 mg via ORAL
  Filled 2019-05-16: qty 2

## 2019-05-16 MED ORDER — LABETALOL HCL 5 MG/ML IV SOLN
INTRAVENOUS | Status: AC
  Filled 2019-05-16: qty 4

## 2019-05-16 MED ORDER — PANTOPRAZOLE SODIUM 40 MG PO TBEC
40.0000 mg | DELAYED_RELEASE_TABLET | Freq: Every day | ORAL | Status: DC
  Administered 2019-05-16 – 2019-05-17 (×2): 40 mg via ORAL
  Filled 2019-05-16 (×2): qty 1

## 2019-05-16 MED ORDER — SODIUM CHLORIDE 0.9 % IV SOLN
INTRAVENOUS | Status: AC | PRN
  Administered 2019-05-16: 1.75 mg/kg/h via INTRAVENOUS
  Administered 2019-05-16: 13:00:00 1.75 mg/kg/h

## 2019-05-16 MED ORDER — NITROGLYCERIN 1 MG/10 ML FOR IR/CATH LAB
INTRA_ARTERIAL | Status: AC
  Filled 2019-05-16: qty 10

## 2019-05-16 SURGICAL SUPPLY — 24 items
BAG SNAP BAND KOVER 36X36 (MISCELLANEOUS) ×3 IMPLANT
BALLN EMERGE MR 2.0X12 (BALLOONS) ×2
BALLN EMERGE MR 2.0X8 (BALLOONS) ×2
BALLN SAPPHIRE 2.0X15 (BALLOONS) ×2
BALLOON EMERGE MR 2.0X12 (BALLOONS) IMPLANT
BALLOON EMERGE MR 2.0X8 (BALLOONS) IMPLANT
BALLOON SAPPHIRE 2.0X15 (BALLOONS) IMPLANT
CATH INFINITI 5 FR JL3.5 (CATHETERS) ×1 IMPLANT
CATH INFINITI 5FR ANG PIGTAIL (CATHETERS) ×1 IMPLANT
CATH OPTITORQUE TIG 4.0 5F (CATHETERS) ×1 IMPLANT
CATH VISTA GUIDE 6FR XBLAD3.5 (CATHETERS) ×1 IMPLANT
COVER DOME SNAP 22 D (MISCELLANEOUS) ×3 IMPLANT
DEVICE RAD COMP TR BAND LRG (VASCULAR PRODUCTS) ×1 IMPLANT
GLIDESHEATH SLEND SS 6F .021 (SHEATH) ×1 IMPLANT
GUIDEWIRE INQWIRE 1.5J.035X260 (WIRE) IMPLANT
INQWIRE 1.5J .035X260CM (WIRE) ×2
KIT ENCORE 26 ADVANTAGE (KITS) ×1 IMPLANT
KIT HEART LEFT (KITS) ×2 IMPLANT
PACK CARDIAC CATHETERIZATION (CUSTOM PROCEDURE TRAY) ×2 IMPLANT
SHEATH PROBE COVER 6X72 (BAG) ×1 IMPLANT
TRANSDUCER W/STOPCOCK (MISCELLANEOUS) ×3 IMPLANT
TUBING CIL FLEX 10 FLL-RA (TUBING) ×2 IMPLANT
WIRE ASAHI PROWATER 180CM (WIRE) ×1 IMPLANT
WIRE RUNTHROUGH .014X180CM (WIRE) ×1 IMPLANT

## 2019-05-16 NOTE — Interval H&P Note (Signed)
History and Physical Interval Note:  05/16/2019 11:40 AM  Christopher Weeks  has presented today for surgery, with the diagnosis of progressive angina.  The various methods of treatment have been discussed with the patient and family. After consideration of risks, benefits and other options for treatment, the patient has consented to  Procedure(s): LEFT HEART CATH AND CORONARY ANGIOGRAPHY (N/A)  PERCUTANEOUS CORONARY INTERVENTION  as a surgical intervention.  The patient's history has been reviewed, patient examined, no change in status, stable for surgery.  I have reviewed the patient's chart and labs.  Questions were answered to the patient's satisfaction.    Cath Lab Visit (complete for each Cath Lab visit)  Clinical Evaluation Leading to the Procedure:   ACS: No.  Non-ACS:    Anginal Classification: CCS III  Anti-ischemic medical therapy: Minimal Therapy (1 class of medications)  Non-Invasive Test Results: No non-invasive testing performed  Prior CABG: No previous CABG    Glenetta Hew
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
## 2019-05-16 NOTE — ED Notes (Signed)
To cath lab.
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
## 2019-05-17 ENCOUNTER — Encounter (HOSPITAL_COMMUNITY): Payer: Self-pay | Admitting: Cardiology

## 2019-05-17 DIAGNOSIS — G20 Parkinson's disease: Secondary | ICD-10-CM | POA: Diagnosis not present

## 2019-05-17 DIAGNOSIS — Z955 Presence of coronary angioplasty implant and graft: Secondary | ICD-10-CM | POA: Diagnosis not present

## 2019-05-17 DIAGNOSIS — I25110 Atherosclerotic heart disease of native coronary artery with unstable angina pectoris: Secondary | ICD-10-CM | POA: Diagnosis not present

## 2019-05-17 DIAGNOSIS — I200 Unstable angina: Secondary | ICD-10-CM

## 2019-05-17 DIAGNOSIS — R7303 Prediabetes: Secondary | ICD-10-CM | POA: Diagnosis not present

## 2019-05-17 DIAGNOSIS — F329 Major depressive disorder, single episode, unspecified: Secondary | ICD-10-CM | POA: Diagnosis not present

## 2019-05-17 DIAGNOSIS — I10 Essential (primary) hypertension: Secondary | ICD-10-CM | POA: Diagnosis not present

## 2019-05-17 DIAGNOSIS — K219 Gastro-esophageal reflux disease without esophagitis: Secondary | ICD-10-CM | POA: Diagnosis not present

## 2019-05-17 DIAGNOSIS — Z9861 Coronary angioplasty status: Secondary | ICD-10-CM | POA: Diagnosis not present

## 2019-05-17 DIAGNOSIS — I252 Old myocardial infarction: Secondary | ICD-10-CM | POA: Diagnosis not present

## 2019-05-17 DIAGNOSIS — I2510 Atherosclerotic heart disease of native coronary artery without angina pectoris: Secondary | ICD-10-CM

## 2019-05-17 DIAGNOSIS — I700 Atherosclerosis of aorta: Secondary | ICD-10-CM | POA: Diagnosis not present

## 2019-05-17 DIAGNOSIS — E785 Hyperlipidemia, unspecified: Secondary | ICD-10-CM | POA: Diagnosis not present

## 2019-05-17 DIAGNOSIS — F419 Anxiety disorder, unspecified: Secondary | ICD-10-CM | POA: Diagnosis not present

## 2019-05-17 DIAGNOSIS — E782 Mixed hyperlipidemia: Secondary | ICD-10-CM | POA: Diagnosis not present

## 2019-05-17 DIAGNOSIS — Z87891 Personal history of nicotine dependence: Secondary | ICD-10-CM | POA: Diagnosis not present

## 2019-05-17 LAB — CBC
HCT: 40.5 % (ref 39.0–52.0)
Hemoglobin: 14.1 g/dL (ref 13.0–17.0)
MCH: 31.8 pg (ref 26.0–34.0)
MCHC: 34.8 g/dL (ref 30.0–36.0)
MCV: 91.2 fL (ref 80.0–100.0)
Platelets: 228 10*3/uL (ref 150–400)
RBC: 4.44 MIL/uL (ref 4.22–5.81)
RDW: 12.5 % (ref 11.5–15.5)
WBC: 11.7 10*3/uL — ABNORMAL HIGH (ref 4.0–10.5)
nRBC: 0 % (ref 0.0–0.2)

## 2019-05-17 LAB — BASIC METABOLIC PANEL
Anion gap: 11 (ref 5–15)
BUN: 15 mg/dL (ref 8–23)
CO2: 24 mmol/L (ref 22–32)
Calcium: 9.5 mg/dL (ref 8.9–10.3)
Chloride: 103 mmol/L (ref 98–111)
Creatinine, Ser: 0.98 mg/dL (ref 0.61–1.24)
GFR calc Af Amer: >60 mL/min (ref >60)
GFR calc non Af Amer: >60 mL/min (ref >60)
Glucose, Bld: 112 mg/dL — ABNORMAL HIGH (ref 70–99)
Potassium: 3.8 mmol/L (ref 3.5–5.1)
Sodium: 138 mmol/L (ref 135–145)

## 2019-05-17 MED ORDER — AMLODIPINE BESYLATE 10 MG PO TABS
10.0000 mg | ORAL_TABLET | Freq: Every day | ORAL | Status: DC
  Administered 2019-05-17: 10 mg via ORAL
  Filled 2019-05-17: qty 1

## 2019-05-17 MED ORDER — AMLODIPINE BESYLATE 10 MG PO TABS: 10.0000 mg | ORAL_TABLET | Freq: Every day | ORAL | 6 refills | Status: DC

## 2019-05-17 MED ORDER — CLOPIDOGREL BISULFATE 75 MG PO TABS: 75.0000 mg | ORAL_TABLET | Freq: Every day | ORAL | 11 refills | Status: DC

## 2019-05-17 MED ORDER — CHLORHEXIDINE GLUCONATE CLOTH 2 % EX PADS: 6.0000 | MEDICATED_PAD | Freq: Every day | CUTANEOUS | Status: DC

## 2019-05-17 MED FILL — Famotidine in NaCl 0.9% IV Soln 20 MG/50ML: INTRAVENOUS | Qty: 50 | Status: AC

## 2019-05-17 MED FILL — Nitroglycerin SL Tab 0.4 MG: SUBLINGUAL | Qty: 1 | Status: AC

## 2019-05-17 MED FILL — CLOPIDOGREL 75 MG TABLET: 75 | 30 days supply | Qty: 30 | Fill #0

## 2019-05-17 MED FILL — AMLODIPINE BESYLATE 10 MG T: 10 | 30 days supply | Qty: 30 | Fill #0

## 2019-05-17 NOTE — Progress Notes (Signed)
Discussed discharge paperwork with patient.  Pt able to verbalize new medications and how to take them.  Pt stated that wife helps put his pills in his medication boxes.  Encouraged him to have her call the unit before 7pm today if he gets home and she has questions regarding what to do with medications.  Pt to be discharged to car with daughter and transitions pharmacy to meet them curbside to give him his medications.  Handoff given to charge that patient was ready for d/c when ride arrived.    Karsten Ro, RN
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
## 2019-05-17 NOTE — Progress Notes (Signed)
CARDIAC REHAB PHASE I   PRE:  Rate/Rhythm: 61 SR    BP: sitting 145/82    SaO2: 97 RA  MODE:  Ambulation: 270 ft   POST:  Rate/Rhythm: 84 SR    BP: sitting 147/89     SaO2:   Pt able to stand and walk at slow pace (due to Parkinson's). No CP or c/o. Discussed Plavix, restrictions, diet, walking at home as tolerated (he likes to walk his dogs), NTG, and CRPII. Will refer to [facility]. Probably best to do in-person CRPII due to Parkinson's. [id number]   [name] CES, ACSM 05/17/2019 10:50 AM

## 2019-05-18 ENCOUNTER — Telehealth: Payer: Self-pay

## 2019-05-18 MED ORDER — HYDROCODONE-ACETAMINOPHEN 10-325 MG PO TABS: 1.0000 | ORAL_TABLET | Freq: Three times a day (TID) | ORAL | 0 refills | Status: AC | PRN

## 2019-05-18 NOTE — Telephone Encounter (Signed)
Significant right arm pain.
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
## 2019-06-01 ENCOUNTER — Ambulatory Visit: Payer: PPO | Admitting: Cardiology

## 2019-06-06 ENCOUNTER — Telehealth: Payer: Self-pay | Admitting: Cardiology

## 2019-06-06 NOTE — Telephone Encounter (Signed)

## 2019-06-07 ENCOUNTER — Ambulatory Visit (INDEPENDENT_AMBULATORY_CARE_PROVIDER_SITE_OTHER): Payer: PPO | Admitting: Cardiology

## 2019-06-07 ENCOUNTER — Other Ambulatory Visit: Payer: Self-pay

## 2019-06-07 ENCOUNTER — Encounter: Payer: Self-pay | Admitting: Cardiology

## 2019-06-07 VITALS — BP 128/80 | HR 68 | Temp 99.9°F | Ht 72.0 in | Wt 191.8 lb

## 2019-06-07 DIAGNOSIS — I2510 Atherosclerotic heart disease of native coronary artery without angina pectoris: Secondary | ICD-10-CM

## 2019-06-07 DIAGNOSIS — Z9861 Coronary angioplasty status: Secondary | ICD-10-CM | POA: Diagnosis not present

## 2019-06-07 DIAGNOSIS — G20 Parkinson's disease: Secondary | ICD-10-CM | POA: Diagnosis not present

## 2019-06-07 DIAGNOSIS — I200 Unstable angina: Secondary | ICD-10-CM

## 2019-06-07 DIAGNOSIS — E785 Hyperlipidemia, unspecified: Secondary | ICD-10-CM

## 2019-06-07 DIAGNOSIS — I10 Essential (primary) hypertension: Secondary | ICD-10-CM | POA: Diagnosis not present

## 2019-06-07 NOTE — Assessment & Plan Note (Signed)
Followed by PCP- on Sinemet
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
## 2019-06-07 NOTE — Assessment & Plan Note (Signed)
Admitted 05/16/2019 for cath-PCI

## 2019-06-07 NOTE — Assessment & Plan Note (Signed)
CAD s/p DES RCA (07/2012 - Promus Element 3.0x32) Cath/ PCI to RI 05/15/2019 (unable to place stent). residual CAD of CFX- medical Rx

## 2019-06-07 NOTE — Assessment & Plan Note (Signed)
Has both true hypertension and white coat hypertension.  Avoid over treatment based on office readings, which run high.

## 2019-06-07 NOTE — Patient Instructions (Signed)
Medication Instructions:  Your physician recommends that you continue on your current medications as directed. Please refer to the Current Medication list given to you today. If you need a refill on your cardiac medications before your next appointment, please call your pharmacy.   Lab work: None  If you have labs (blood work) drawn today and your tests are completely normal, you will receive your results only by: . MyChart Message (if you have MyChart) OR . A paper copy in the mail If you have any lab test that is abnormal or we need to change your treatment, we will call you to review the results.  Testing/Procedures: None   Follow-Up: At [facility], you and your health needs are our priority.  As part of our continuing mission to provide you with exceptional heart care, we have created designated Provider Care Teams.  These Care Teams include your primary Cardiologist (physician) and Advanced Practice Providers (APPs -  Physician Assistants and Nurse Practitioners) who all work together to provide you with the care you need, when you need it. You will need a follow up appointment in 3 months. Please call our office 2 months in advance to schedule this appointment.  You may see Mihai Croitoru, MD or one of the following Advanced Practice Providers on your designated Care Team: Hao Meng, PA-C . Angela Duke, PA-C  Any Other Special Instructions Will Be Listed Below (If Applicable).   

## 2019-06-07 NOTE — Assessment & Plan Note (Signed)
LDL 73 on Lipitor 80 and Zetia (05/16/2019)

## 2019-06-13 ENCOUNTER — Other Ambulatory Visit: Payer: Self-pay

## 2019-06-13 MED ORDER — FAMOTIDINE 20 MG PO TABS: 20.0000 mg | ORAL_TABLET | Freq: Two times a day (BID) | ORAL | 0 refills | Status: DC

## 2019-06-13 MED ORDER — AMLODIPINE BESYLATE 10 MG PO TABS: 10.0000 mg | ORAL_TABLET | Freq: Every day | ORAL | 0 refills | Status: DC

## 2019-06-13 MED ORDER — ESOMEPRAZOLE MAGNESIUM 20 MG PO CPDR: 20.0000 mg | DELAYED_RELEASE_CAPSULE | ORAL | 0 refills | Status: DC

## 2019-06-13 MED ORDER — CLOPIDOGREL BISULFATE 75 MG PO TABS: 75.0000 mg | ORAL_TABLET | Freq: Every day | ORAL | 0 refills | Status: DC

## 2019-06-22 ENCOUNTER — Other Ambulatory Visit: Payer: Self-pay | Admitting: Cardiovascular Disease

## 2019-07-07 ENCOUNTER — Other Ambulatory Visit: Payer: Self-pay | Admitting: Family Medicine

## 2019-07-20 ENCOUNTER — Telehealth: Payer: Self-pay | Admitting: Family Medicine

## 2019-07-20 DIAGNOSIS — G20 Parkinson's disease: Secondary | ICD-10-CM

## 2019-07-20 NOTE — Telephone Encounter (Signed)
Christopher Weeks is calling and would like a referral for her husband to see Christopher Weeks at [place] for [place]. [email or web address]

## 2019-07-21 ENCOUNTER — Encounter: Payer: Self-pay | Admitting: Neurology

## 2019-07-21 NOTE — Telephone Encounter (Signed)
Referral placed as requested.

## 2019-07-21 NOTE — Assessment & Plan Note (Signed)
Referal placed per patient request.

## 2019-07-28 ENCOUNTER — Encounter (HOSPITAL_COMMUNITY): Payer: PPO

## 2019-07-29 ENCOUNTER — Telehealth: Payer: Self-pay | Admitting: *Deleted

## 2019-07-29 NOTE — Telephone Encounter (Signed)
Wife calls because she is not sure pt should be taking clopidogrel 75mg  given by "one of his other providers".  Wife would like to speak with Dr. Andria Frames.  Christen Bame, CMA

## 2019-07-29 NOTE — Telephone Encounter (Signed)
Reviewed cards notes.  Ballooned but not stented in July 2020. Was on aspirin prior to balloon.  Plavix added post balloon.  Yes, needs dual antiplatelet Rx for a while.  Maybe it can be a short time with balloon only.  We reviewed that he needs FU cards in late Oct, early Nov.  Keep that appointment and ask duration of Plavix at that visit.

## 2019-08-02 ENCOUNTER — Telehealth: Payer: Self-pay

## 2019-08-02 NOTE — Telephone Encounter (Signed)
-----   Message from Reeves, DO sent at 08/01/2019  6:01 PM EDT ----- Please call pt and find out where he has previously received his neuro care so we can get records and once you get them put them with his NP appt stuff.  thanks

## 2019-08-02 NOTE — Telephone Encounter (Signed)
Called spoke with spouse she states that patient PCP has been taken care of his PD since 2014. She states that he has never seen a neurologist. She will look through his records to see if he has if so she will call our office back with the provider information.   Pt wasn't available to speak to

## 2019-08-08 ENCOUNTER — Other Ambulatory Visit: Payer: Self-pay | Admitting: Family Medicine
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
## 2019-08-30 ENCOUNTER — Ambulatory Visit (INDEPENDENT_AMBULATORY_CARE_PROVIDER_SITE_OTHER): Payer: PPO | Admitting: Neurology

## 2019-08-30 ENCOUNTER — Other Ambulatory Visit (INDEPENDENT_AMBULATORY_CARE_PROVIDER_SITE_OTHER): Payer: PPO

## 2019-08-30 ENCOUNTER — Encounter: Payer: Self-pay | Admitting: Neurology

## 2019-08-30 ENCOUNTER — Other Ambulatory Visit: Payer: Self-pay

## 2019-08-30 VITALS — BP 114/69 | HR 47 | Temp 97.7°F | Ht 72.0 in | Wt 184.0 lb

## 2019-08-30 DIAGNOSIS — G20 Parkinson's disease: Secondary | ICD-10-CM

## 2019-08-30 DIAGNOSIS — R251 Tremor, unspecified: Secondary | ICD-10-CM | POA: Diagnosis not present

## 2019-08-30 LAB — TSH: TSH: 1.8 u[IU]/mL (ref 0.35–4.50)

## 2019-08-30 MED ORDER — PRAMIPEXOLE DIHYDROCHLORIDE 0.5 MG PO TABS: 0.5000 mg | ORAL_TABLET | Freq: Three times a day (TID) | ORAL | 1 refills | Status: DC

## 2019-08-30 MED ORDER — PRAMIPEXOLE DIHYDROCHLORIDE 0.125 MG PO TABS: ORAL_TABLET | ORAL | 0 refills | Status: DC

## 2019-08-30 NOTE — Patient Instructions (Addendum)
1.  Start mirapex (pramipexole) as follows:  0.125 mg - 1 tablet three times per day for a week, then 2 tablets three times per day for a week and then fill the 0.5 mg tablet and take that, 1 pill three times per day  2.  Take carbidopa/levodopa 25/100, 2 tablets at 7am/11am/4pm.   3.  Exercise!  The physicians and staff at [facility] are committed to providing excellent care. You may receive a survey requesting feedback about your experience at our office. We strive to receive "very good" responses to the survey questions. If you feel that your experience would prevent you from giving the office a "very good " response, please contact our office to try to remedy the situation. We may be reached at [phone]. Thank you for taking the time out of your busy day to complete the survey.

## 2019-09-06 ENCOUNTER — Telehealth: Payer: Self-pay | Admitting: *Deleted

## 2019-09-06 ENCOUNTER — Encounter: Payer: Self-pay | Admitting: *Deleted

## 2019-09-06 NOTE — Telephone Encounter (Signed)
Left a message, per the dpr, to call back to confirm his virtual appointment tomorrow with Dr. Sallyanne Kuster and to see if he could have his current weight, blood pressure and heart rate available.

## 2019-09-07 ENCOUNTER — Telehealth (INDEPENDENT_AMBULATORY_CARE_PROVIDER_SITE_OTHER): Payer: PPO | Admitting: Cardiovascular Disease

## 2019-09-07 ENCOUNTER — Encounter: Payer: Self-pay | Admitting: Cardiovascular Disease

## 2019-09-07 ENCOUNTER — Other Ambulatory Visit: Payer: Self-pay | Admitting: Family Medicine

## 2019-09-07 DIAGNOSIS — I10 Essential (primary) hypertension: Secondary | ICD-10-CM

## 2019-09-07 DIAGNOSIS — E785 Hyperlipidemia, unspecified: Secondary | ICD-10-CM | POA: Diagnosis not present

## 2019-09-07 DIAGNOSIS — G20 Parkinson's disease: Secondary | ICD-10-CM

## 2019-09-07 DIAGNOSIS — R7303 Prediabetes: Secondary | ICD-10-CM

## 2019-09-07 DIAGNOSIS — I25118 Atherosclerotic heart disease of native coronary artery with other forms of angina pectoris: Secondary | ICD-10-CM

## 2019-09-07 NOTE — Patient Instructions (Signed)
Medication Instructions:  No changes *If you need a refill on your cardiac medications before your next appointment, please call your pharmacy*  Lab Work: None ordered If you have labs (blood work) drawn today and your tests are completely normal, you will receive your results only by: Marland Kitchen MyChart Message (if you have MyChart) OR . A paper copy in the mail If you have any lab test that is abnormal or we need to change your treatment, we will call you to review the results.  Testing/Procedures: None ordered  Follow-Up: At [facility], you and your health needs are our priority.  As part of our continuing mission to provide you with exceptional heart care, we have created designated Provider Care Teams.  These Care Teams include your primary Cardiologist (physician) and Advanced Practice Providers (APPs -  Physician Assistants and Nurse Practitioners) who all work together to provide you with the care you need, when you need it.  Your next appointment:   6 months  The format for your next appointment:   Either In Person or Virtual  Provider:   Sanda Klein, MD

## 2019-09-07 NOTE — Progress Notes (Signed)
Virtual Visit via Video Note   This visit type was conducted due to national recommendations for restrictions regarding the COVID-19 Pandemic (e.g. social distancing) in an effort to limit this patient's exposure and mitigate transmission in our community.  Due to his co-morbid illnesses, this patient is at least at moderate risk for complications without adequate follow up.  This format is felt to be most appropriate for this patient at this time.  All issues noted in this document were discussed and addressed.  A limited physical exam was performed with this format.  Please refer to the patient's chart for his consent to telehealth for [facility].   Date:  09/07/2019   ID:  Christopher Weeks, DOB 01-05-1951, MRN [id number]  Patient Location: Home Provider Location: Other:  [place]  PCP:  Zenia Resides, MD  Cardiologist:  Kiaja Shorty Electrophysiologist:  None   Evaluation Performed:  Follow-Up Visit  Chief Complaint:  Exertional angina  History of Present Illness:    Christopher Weeks is a 68 y.o. male with coronary artery disease (acute inferior MI, drug-eluting 3.0 x32 mm Promus stent to RCA in 2013; high-grade stenoses in small caliber left circumflex and first diagonal arteries, left for medical therapy), hyperlipidemia, hypertension, prediabetes, Parkinson's disease.  Few months ago he was having problems with exertional angina.  This did not improve after we increased his long-acting nitrate and added amlodipine 5 mg once daily.  He underwent cardiac catheterization on May 16, 2019 and underwent angioplasty (failed attempt at stent placement) to the ramus intermedius.  The previously placed stent in the right coronary artery was widely patent and he had a 95% stenosis at the ostium of a small AV groove circumflex artery that was too small for percutaneous revascularization.  He had normal LVEF and normal filling pressures.  He has occasional mild swelling of his ankles that resolves  overnight.  He does not have exertional dyspnea but is also quite sedentary.  His neurologist has encouraged him to exercise more to help with his tremors.  He does enjoy mowing the lawn and does not develop dyspnea or angina when he does this.  His most recent lipid profile performed in July showed an LDL cholesterol that was very close to the target of 70 or less. He is on maximum dose atorvastatin and low-dose aspirin plus clopidogrel.  He is intolerant of beta-blockers due to symptomatic bradycardia.  He had a normal nuclear perfusion study in 2017.  The patient does not have symptoms concerning for COVID-19 infection (fever, chills, cough, or new shortness of breath).    Past Medical History:  Diagnosis Date  . Anxiety   . Coronary artery disease involving native coronary artery 2013   DES PCI (3x30 2 mm Promus) mid RCA; July 2020 PTCA only of proximal Ramus Intermedius  . Depression   . Dysrhythmia   . GERD (gastroesophageal reflux disease)   . Heart murmur   . History of colon polyps   . History of kidney stones   . Hypertension   . Kidney stones   . Myocardial infarction ([place])   . Parkinson disease ([place])   . Parkinson's disease [facility])    Past Surgical History:  Procedure Laterality Date  . anal Energy manager    . CARDIAC CATHETERIZATION  07/11/2012   high graded stenoses small ift circ and small 1st diag   which were  left for medicl therapy, lv systoilc fx preserved  . CORONARY ANGIOPLASTY  07/11/2012   90%  and 70% lesions RCA  3.0 x32 mm Promus DES  . CORONARY BALLOON ANGIOPLASTY N/A 05/16/2019   Procedure: CORONARY BALLOON ANGIOPLASTY;  Surgeon: Leonie Man, MD;  Location: [place] CV [facility];  Service: Cardiovascular;  Laterality: N/A;  . CORONARY STENT PLACEMENT  07/11/12  . EXTRACORPOREAL SHOCK WAVE LITHOTRIPSY Left 04/22/2018   Procedure: LEFT EXTRACORPOREAL SHOCK WAVE LITHOTRIPSY (ESWL);  Surgeon: Cleon Gustin, MD;  Location: [facility];  Service: Urology;   Laterality: Left;  . EXTRACORPOREAL SHOCK WAVE LITHOTRIPSY Left 05/31/2018   Procedure: LEFT EXTRACORPOREAL SHOCK WAVE LITHOTRIPSY (ESWL);  Surgeon: Alexis Frock, MD;  Location: [facility];  Service: Urology;  Laterality: Left;  [phone] [employer] [id number]  . LEFT HEART CATH N/A 07/11/2012   Procedure: LEFT HEART CATH;  Surgeon: Sanda Klein, MD;  Location: [facility] CATH LAB;  Service: Cardiovascular;  Laterality: N/A;  . LEFT HEART CATH AND CORONARY ANGIOGRAPHY N/A 05/16/2019   Procedure: LEFT HEART CATH AND CORONARY ANGIOGRAPHY;  Surgeon: Leonie Man, MD;  Location: [place] CV [facility];  Service: Cardiovascular;  Laterality: N/A;  . PERCUTANEOUS CORONARY STENT INTERVENTION (PCI-S) Right 07/11/2012   Procedure: PERCUTANEOUS CORONARY STENT INTERVENTION (PCI-S);  Surgeon: Sanda Klein, MD;  Location: [facility] CATH LAB;  Service: Cardiovascular;  Laterality: Right;     Current Meds  Medication Sig  . amLODipine (NORVASC) 10 MG tablet TAKE 1 TABLET BY MOUTH ONCE A DAY.  Marland Kitchen aspirin 81 MG tablet Take 81 mg by mouth daily.   Marland Kitchen atorvastatin (LIPITOR) 80 MG tablet TAKE (1) TABLET BY MOUTH AT BEDTIME.  . Calcium-Vitamin D 600-125 MG-UNIT TABS Take 1 tablet by mouth daily.   . carbidopa-levodopa (SINEMET IR) 25-100 MG tablet TAKE 2 TABLETS BY MOUTH THREE TIMES DAILY.  . citalopram (CELEXA) 40 MG tablet TAKE ONE TABLET BY MOUTH DAILY.  Marland Kitchen clopidogrel (PLAVIX) 75 MG tablet Take 1 tablet (75 mg total) by mouth daily with breakfast.  . esomeprazole (NEXIUM) 20 MG capsule TAKE 1 CAPSULE BY MOUTH EVERY MORNING.  . famotidine (PEPCID) 20 MG tablet TAKE 1 TABLET BY MOUTH 2 TIMES DAILY.  . isosorbide mononitrate (IMDUR) 60 MG 24 hr tablet TAKE ONE TABLET BY MOUTH DAILY.  Marland Kitchen pramipexole (MIRAPEX) 0.125 MG tablet 1 tablet three times per day for a week, then 2 tablets three times per day for a week  . pramipexole (MIRAPEX) 0.5 MG tablet Take 1 tablet (0.5 mg total) by mouth 3 (three) times daily.  .  [DISCONTINUED] lisinopril (PRINIVIL,ZESTRIL) 20 MG tablet TAKE (1) TABLET BY MOUTH ONCE DAILY.   Current Facility-Administered Medications for the 09/07/19 encounter (Telemedicine) with Sanda Klein, MD  Medication  . sodium chloride flush (NS) 0.9 % injection 3 mL     Allergies:   Codeine   Social History   Tobacco Use  . Smoking status: Former Smoker    Packs/day: 1.00    Years: 20.00    Pack years: 20.00    Types: Cigarettes    Quit date: 11/10/1996    Years since quitting: 22.8  . Smokeless tobacco: Never Used  Substance Use Topics  . Alcohol use: No    Alcohol/week: 0.0 standard drinks  . Drug use: Yes    Types: Marijuana    Comment: not using, uses CBD oil now- 04/21/18     Family Hx: The patient's family history includes Cancer in his father and mother; Heart attack in his father. He was adopted.  ROS:   Please see the history of present illness.  All other systems are reviewed and are negative  Prior CV studies:   The following studies were reviewed today: Angiography/PCI images May 16, 2019  Labs/Other Tests and Data Reviewed:    EKG:  An ECG dated 06/07/2019 was personally reviewed today and demonstrated:  Normal sinus rhythm, prominent artifact from Parkinson's disease, no repolarization abnormalities, normal QTC just under 450 ms  Recent Labs: 05/17/2019: BUN 15; Creatinine, Ser 0.98; Hemoglobin 14.1; Platelets 228; Potassium 3.8; Sodium 138 08/30/2019: TSH 1.80   Recent Lipid Panel Lab Results  Component Value Date/Time   CHOL 135 05/12/2019 03:18 PM   TRIG 138 05/12/2019 03:18 PM   HDL 34 (L) 05/12/2019 03:18 PM   CHOLHDL 4.0 05/12/2019 03:18 PM   CHOLHDL 4.7 02/12/2016 10:00 AM   LDLCALC 73 05/12/2019 03:18 PM    Wt Readings from Last 3 Encounters:  09/07/19 190 lb (86.2 kg)  08/30/19 184 lb (83.5 kg)  06/07/19 191 lb 12.8 oz (87 kg)     Objective:    Vital Signs:  BP (!) 138/92   Pulse 63   Ht 6' (1.829 m)   Wt 190 lb (86.2 kg)    BMI 25.77 kg/m    VITAL SIGNS:  reviewed GEN:  no acute distress EYES:  sclerae anicteric, EOMI - Extraocular Movements Intact RESPIRATORY:  normal respiratory effort, symmetric expansion CARDIOVASCULAR:  no peripheral edema SKIN:  no rash, lesions or ulcers. MUSCULOSKELETAL:  no obvious deformities. NEURO:  alert and oriented x 3, no obvious focal deficit PSYCH:  normal affect  His facial expressions are little slower than average and he has mild tremor of the hands.  ASSESSMENT & PLAN:    1. CAD: I suspect he will continue to have occasional episodes of angina pectoris with exertion.  We reviewed the difference between stable and unstable angina.  He is at risk for developing restenosis in the ramus intermedius when he underwent angioplasty alone, most likely to occur between January and June of next year.  Repeatedly advised him that he should immediately seek urgent medical care if he has symptoms that persist for 30 minutes or longer, not relieved by 3 consecutive sublingual nitroglycerin tablets.  Continue isosorbide mononitrate and amlodipine 5 mg daily (which is likely responsible for his mild ankle swelling).  Beta-blocker intolerant due to bradycardia. 2. HTN: His blood pressure is a little high today, but just a week ago it was 114/69 and his neurologist's office.  His home monitor is a wrist device that is likely to be less accurate.  No changes to his medications. 3. HLP: LDL cholesterol very close to target on combination maximum dose atorvastatin and ezetimibe. 4. PreDM: Hemoglobin A1c remains similar to last years at 6%.  Weight loss and more physical exercise were discussed.  COVID-19 Education: The signs and symptoms of COVID-19 were discussed with the patient and how to seek care for testing (follow up with PCP or arrange E-visit).  The importance of social distancing was discussed today.  Time:   Today, I have spent 29 minutes with the patient with telehealth technology  discussing the above problems.     Medication Adjustments/Labs and Tests Ordered: Current medicines are reviewed at length with the patient today.  Concerns regarding medicines are outlined above.   Tests Ordered: No orders of the defined types were placed in this encounter.   Medication Changes: No orders of the defined types were placed in this encounter.  Patient Instructions  Medication Instructions:  No changes *If you  need a refill on your cardiac medications before your next appointment, please call your pharmacy*  Lab Work: None ordered If you have labs (blood work) drawn today and your tests are completely normal, you will receive your results only by: Marland Kitchen MyChart Message (if you have MyChart) OR . A paper copy in the mail If you have any lab test that is abnormal or we need to change your treatment, we will call you to review the results.  Testing/Procedures: None ordered  Follow-Up: At [facility], you and your health needs are our priority.  As part of our continuing mission to provide you with exceptional heart care, we have created designated Provider Care Teams.  These Care Teams include your primary Cardiologist (physician) and Advanced Practice Providers (APPs -  Physician Assistants and Nurse Practitioners) who all work together to provide you with the care you need, when you need it.  Your next appointment:   6 months  The format for your next appointment:   Either In Person or Virtual  Provider:   Sanda Klein, MD     Follow Up:  Virtual Visit or In Person 3 months  Signed, Sanda Klein, MD  09/07/2019 2:18 PM    [place]

## 2019-10-03 ENCOUNTER — Other Ambulatory Visit: Payer: Self-pay | Admitting: Family Medicine

## 2019-11-01 ENCOUNTER — Other Ambulatory Visit: Payer: Self-pay | Admitting: Family Medicine

## 2019-11-01 ENCOUNTER — Other Ambulatory Visit: Payer: Self-pay | Admitting: Cardiovascular Disease

## 2019-11-01 ENCOUNTER — Other Ambulatory Visit: Payer: Self-pay | Admitting: Neurology

## 2019-11-01 DIAGNOSIS — F321 Major depressive disorder, single episode, moderate: Secondary | ICD-10-CM

## 2019-12-01 ENCOUNTER — Other Ambulatory Visit: Payer: Self-pay | Admitting: Neurology

## 2019-12-01 ENCOUNTER — Other Ambulatory Visit: Payer: Self-pay | Admitting: Family Medicine

## 2019-12-01 DIAGNOSIS — F321 Major depressive disorder, single episode, moderate: Secondary | ICD-10-CM

## 2019-12-05 NOTE — Progress Notes (Signed)
Virtual Visit via Video Note The purpose of this virtual visit is to provide medical care while limiting exposure to the novel coronavirus.    Consent was obtained for video visit:  Yes.   Answered questions that patient had about telehealth interaction:  Yes.   I discussed the limitations, risks, security and privacy concerns of performing an evaluation and management service by telemedicine. I also discussed with the patient that there may be a patient responsible charge related to this service. The patient expressed understanding and agreed to proceed.  Pt location: Home Physician Location: home Name of referring provider:  Zenia Resides, MD I connected with [name] at patients initiation/request on 12/07/2019 at 10:15 AM EST by video enabled telemedicine application and verified that I am speaking with the correct person using two identifiers. Pt MRN:  [id number] Pt DOB:  [birth year] Video Participants:  [name];  wife   History of Present Illness:  Patient seen today in follow-up for Parkinson's disease.  Pramipexole last visit. "Its doing great."  Tremors have almost completely gone away.   Reports no compulsive behaviors.  No sleep attacks. Does take a 2 hour nap during the day but able to sleep all night still.  Pt denies falls.  Pt denies lightheadedness, near syncope.  No hallucinations in the day.  Sometimes when he awakens at night he will see a shadow in the door that he may think is a person.  Mood has been good.  Records are reviewed since last visit, including cardiology records from October 28.  No medication changes were made at that visit.  Current movement d/o meds: Carbidopa/levodopa 25/100, 2 tablets at 7 AM/11 AM/4 PM Pramipexole 0.5 mg 3 times per day (added last visit)    Current Outpatient Medications on File Prior to Visit  Medication Sig Dispense Refill  . amLODipine (NORVASC) 10 MG tablet TAKE 1 TABLET BY MOUTH ONCE A DAY. 90 tablet 3  .  aspirin 81 MG tablet Take 81 mg by mouth daily.     Marland Kitchen atorvastatin (LIPITOR) 80 MG tablet TAKE (1) TABLET BY MOUTH AT BEDTIME. 90 tablet 3  . Calcium-Vitamin D 600-125 MG-UNIT TABS Take 1 tablet by mouth daily.     . carbidopa-levodopa (SINEMET IR) 25-100 MG tablet TAKE 2 TABLETS BY MOUTH THREE TIMES DAILY. 180 tablet 12  . citalopram (CELEXA) 40 MG tablet TAKE ONE TABLET BY MOUTH DAILY. 90 tablet 3  . clopidogrel (PLAVIX) 75 MG tablet Take 1 tablet (75 mg total) by mouth daily with breakfast. 30 tablet 0  . esomeprazole (NEXIUM) 20 MG capsule TAKE 1 CAPSULE BY MOUTH EVERY MORNING. 90 capsule 3  . ezetimibe (ZETIA) 10 MG tablet TAKE 1 TABLET BY MOUTH ONCE A DAY. 90 tablet 3  . famotidine (PEPCID) 20 MG tablet TAKE 1 TABLET BY MOUTH 2 TIMES DAILY. 180 tablet 3  . isosorbide mononitrate (IMDUR) 60 MG 24 hr tablet TAKE ONE TABLET BY MOUTH DAILY. 90 tablet 1  . lisinopril (ZESTRIL) 20 MG tablet TAKE (1) TABLET BY MOUTH ONCE DAILY. 90 tablet 3  . pramipexole (MIRAPEX) 0.5 MG tablet TAKE (1) TABLET BY MOUTH (3) TIMES DAILY. 90 tablet 0  . nitroGLYCERIN (NITROSTAT) 0.4 MG SL tablet Place 1 tablet (0.4 mg total) under the tongue every 5 (five) minutes as needed for chest pain. 25 tablet 3   Current Facility-Administered Medications on File Prior to Visit  Medication Dose Route Frequency Provider Last Rate Last Admin  . sodium  chloride flush (NS) 0.9 % injection 3 mL  3 mL Intravenous Q12H Croitoru, Mihai, MD         Observations/Objective:   There were no vitals filed for this visit. GEN:  The patient appears stated age and is in NAD.  Neurological examination:  Orientation: The patient is alert and oriented x3. Cranial nerves: There is good facial symmetry. There is mild facial hypomimia.  The speech is fluent and clear. Soft palate rises symmetrically and there is no tongue deviation. Hearing is intact to conversational tone. Motor: Strength is at least antigravity x 4.   Shoulder shrug is  equal and symmetric.  There is no pronator drift.  Movement examination: Tone: unable Abnormal movements: RUE tremor with ambulation only Coordination:  There is no decremation with RAM's, with any form of RAMS, including alternating supination and pronation of the forearm, hand opening and closing, finger taps Gait and Station: The patient has no difficulty arising out of a deep-seated chair without the use of the hands. The patient's stride length is good but he is flexed at the waist.      Assessment and Plan:   1.  Parkinsons Disease  -continue carbidopa/levodopa 25/100, 2 po tid  -continue pramipexole 0.5 mg tid  R/B/SE were discussed.  Will need to watch the nightime visual distortions he is having.   The opportunity to ask questions was given and they were answered to the best of my ability.  The patient expressed understanding and willingness to follow the outlined treatment protocols.  -exercise  -We discussed that it used to be thought that levodopa would increase risk of melanoma but now it is believed that Parkinsons itself likely increases risk of melanoma. he is to get regular skin checks.  Told him to make appt 2.  CAD  -following with cardiology Follow Up Instructions:    -I discussed the assessment and treatment plan with the patient. The patient was provided an opportunity to ask questions and all were answered. The patient agreed with the plan and demonstrated an understanding of the instructions.   The patient was advised to call back or seek an in-person evaluation if the symptoms worsen or if the condition fails to improve as anticipated.    Total time spent on today's visit was 16minutes, including both face-to-face time and nonface-to-face time.  Time included that spent on review of records (prior notes available to me/labs/imaging if pertinent), discussing treatment and goals, answering patient's questions and coordinating care.   Alonza Bogus, DO

## 2019-12-06 ENCOUNTER — Encounter: Payer: Self-pay | Admitting: Neurology

## 2019-12-07 ENCOUNTER — Telehealth (INDEPENDENT_AMBULATORY_CARE_PROVIDER_SITE_OTHER): Payer: PPO | Admitting: Neurology

## 2019-12-07 ENCOUNTER — Other Ambulatory Visit: Payer: Self-pay

## 2019-12-07 DIAGNOSIS — G20 Parkinson's disease: Secondary | ICD-10-CM | POA: Diagnosis not present

## 2019-12-23 ENCOUNTER — Other Ambulatory Visit: Payer: Self-pay | Admitting: Neurology

## 2020-01-02 IMAGING — DX DG ABDOMEN 1V
1 series · 1 of 1 positions shown · non-contrast
Comparison: Radiograph dated 04/22/2018 and CT scan dated
04/15/2018

CLINICAL DATA: Persistent abdominal pain. Left-sided ureteral
stone.

EXAM:
ABDOMEN - 1 VIEW

[abdomen kub]
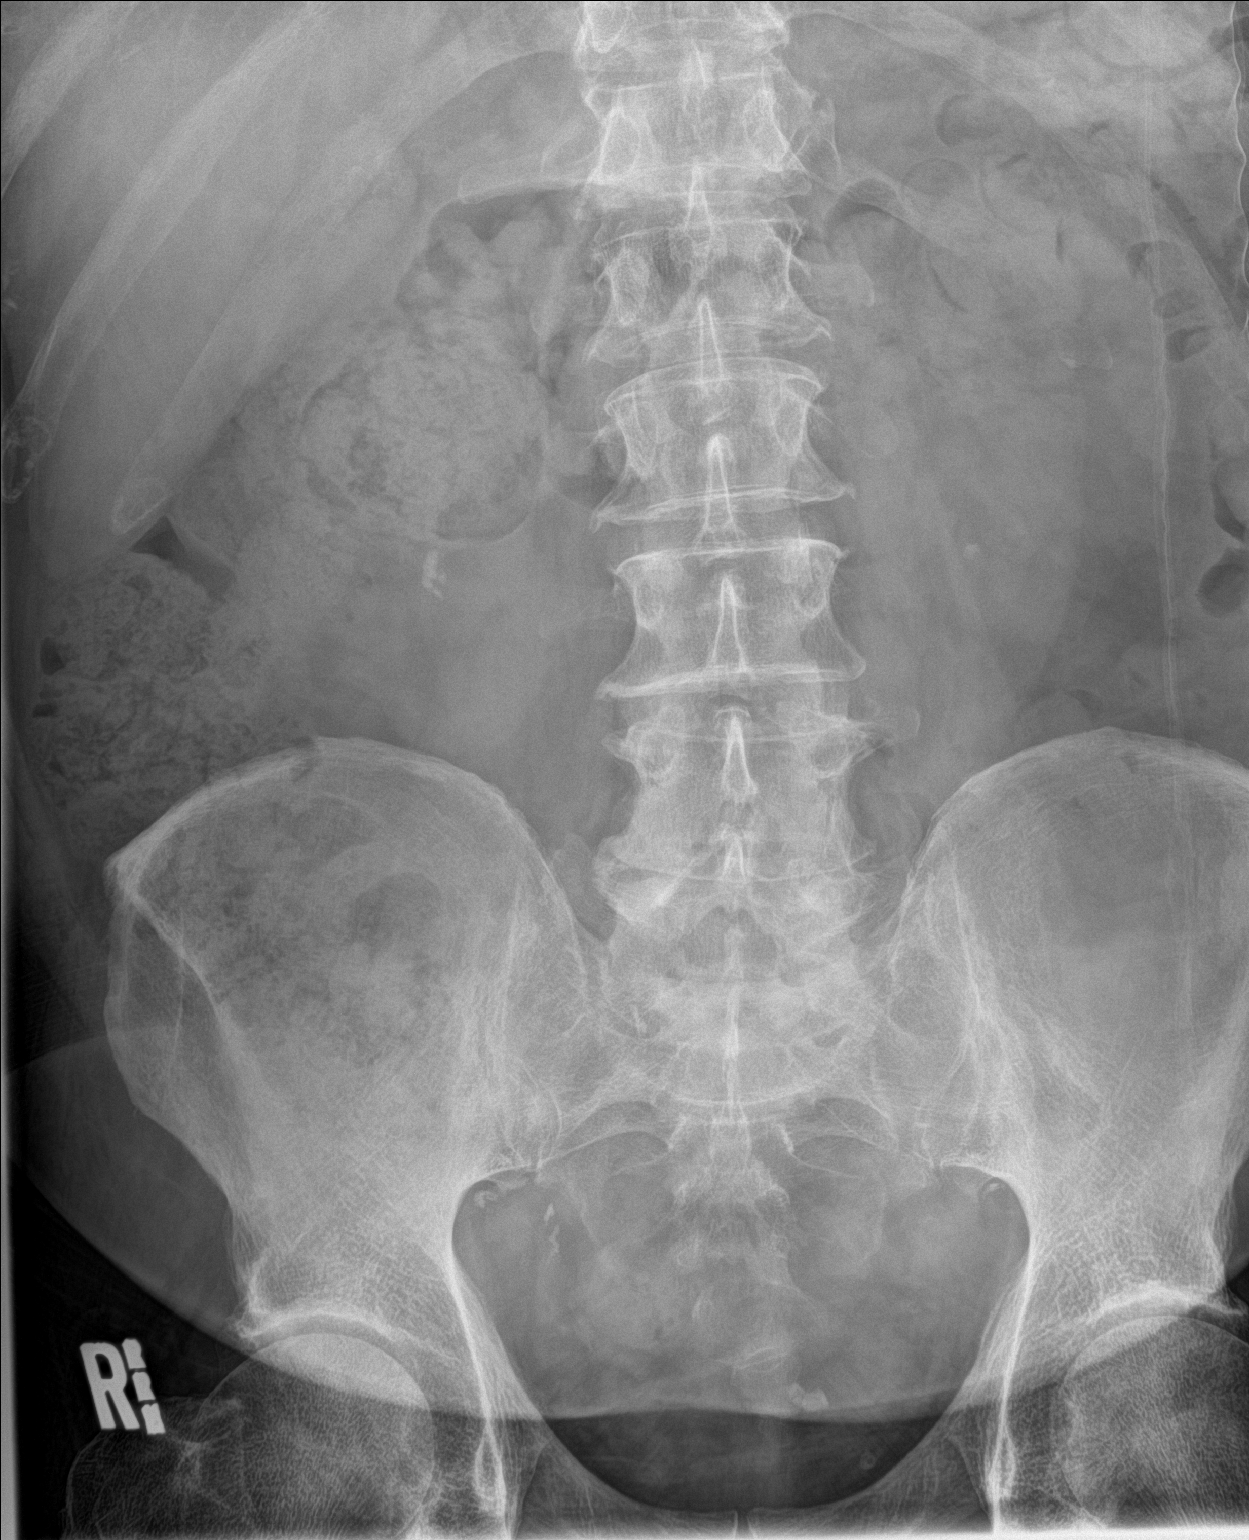

[1 of 1 positions shown; findings below may reference images not displayed]

FINDINGS: The multiple stones in the distal left ureter persist, unchanged.
Stones persist in the lower pole of the right kidney, unchanged.

Bowel gas pattern is normal. 4 mm density just to the left of the
psoas muscle at the L2-3 level is not felt represent a ureteral or
renal calculus.

No acute bone abnormality.
IMPRESSION: No change in the position of the stones in the distal left ureter.
No change in the multiple stones in the lower pole of the right
kidney.

## 2020-01-09 IMAGING — DX DG ABDOMEN 1V
2 series · 2 of 2 positions shown · non-contrast
Comparison: Abdominal x-ray dated May 05, 2018.

CLINICAL DATA: Right lower quadrant pain. History of kidney stones
status post lithotripsy a few weeks ago.

EXAM:
ABDOMEN - 1 VIEW

[abdomen kub (1 of 2)]
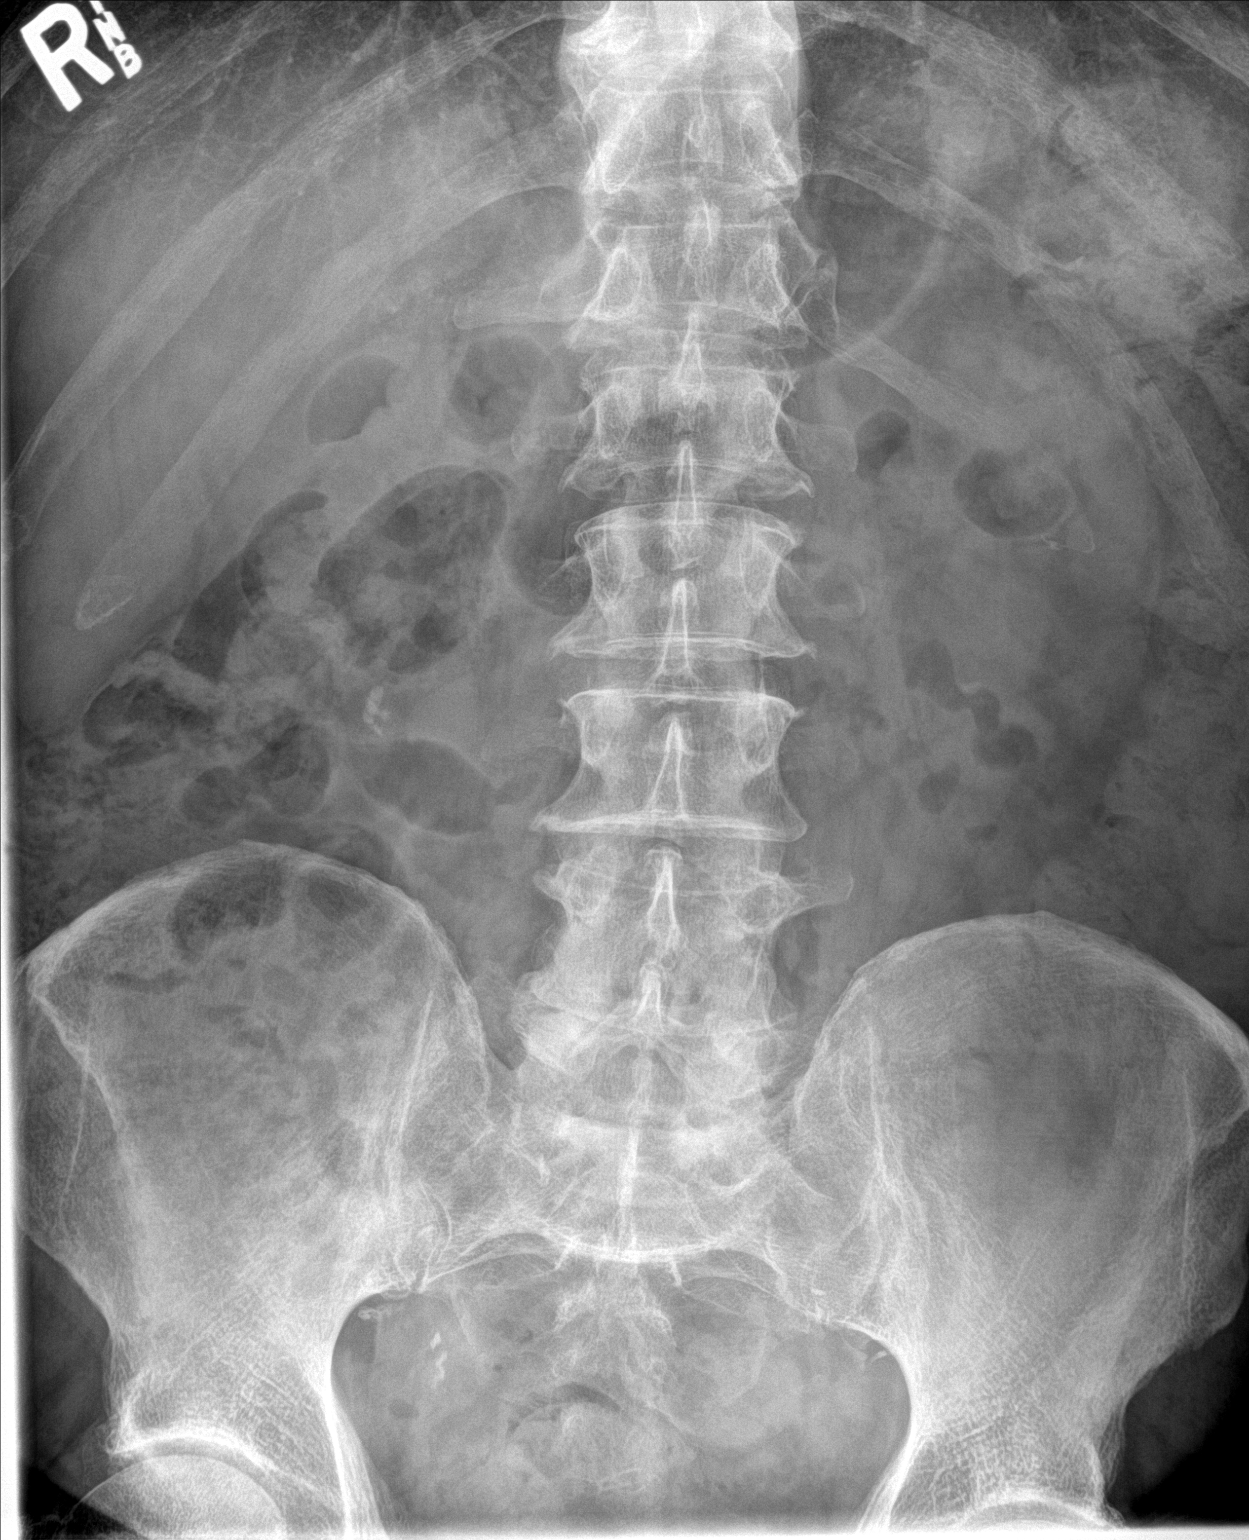

[abdomen kub (2 of 2)]
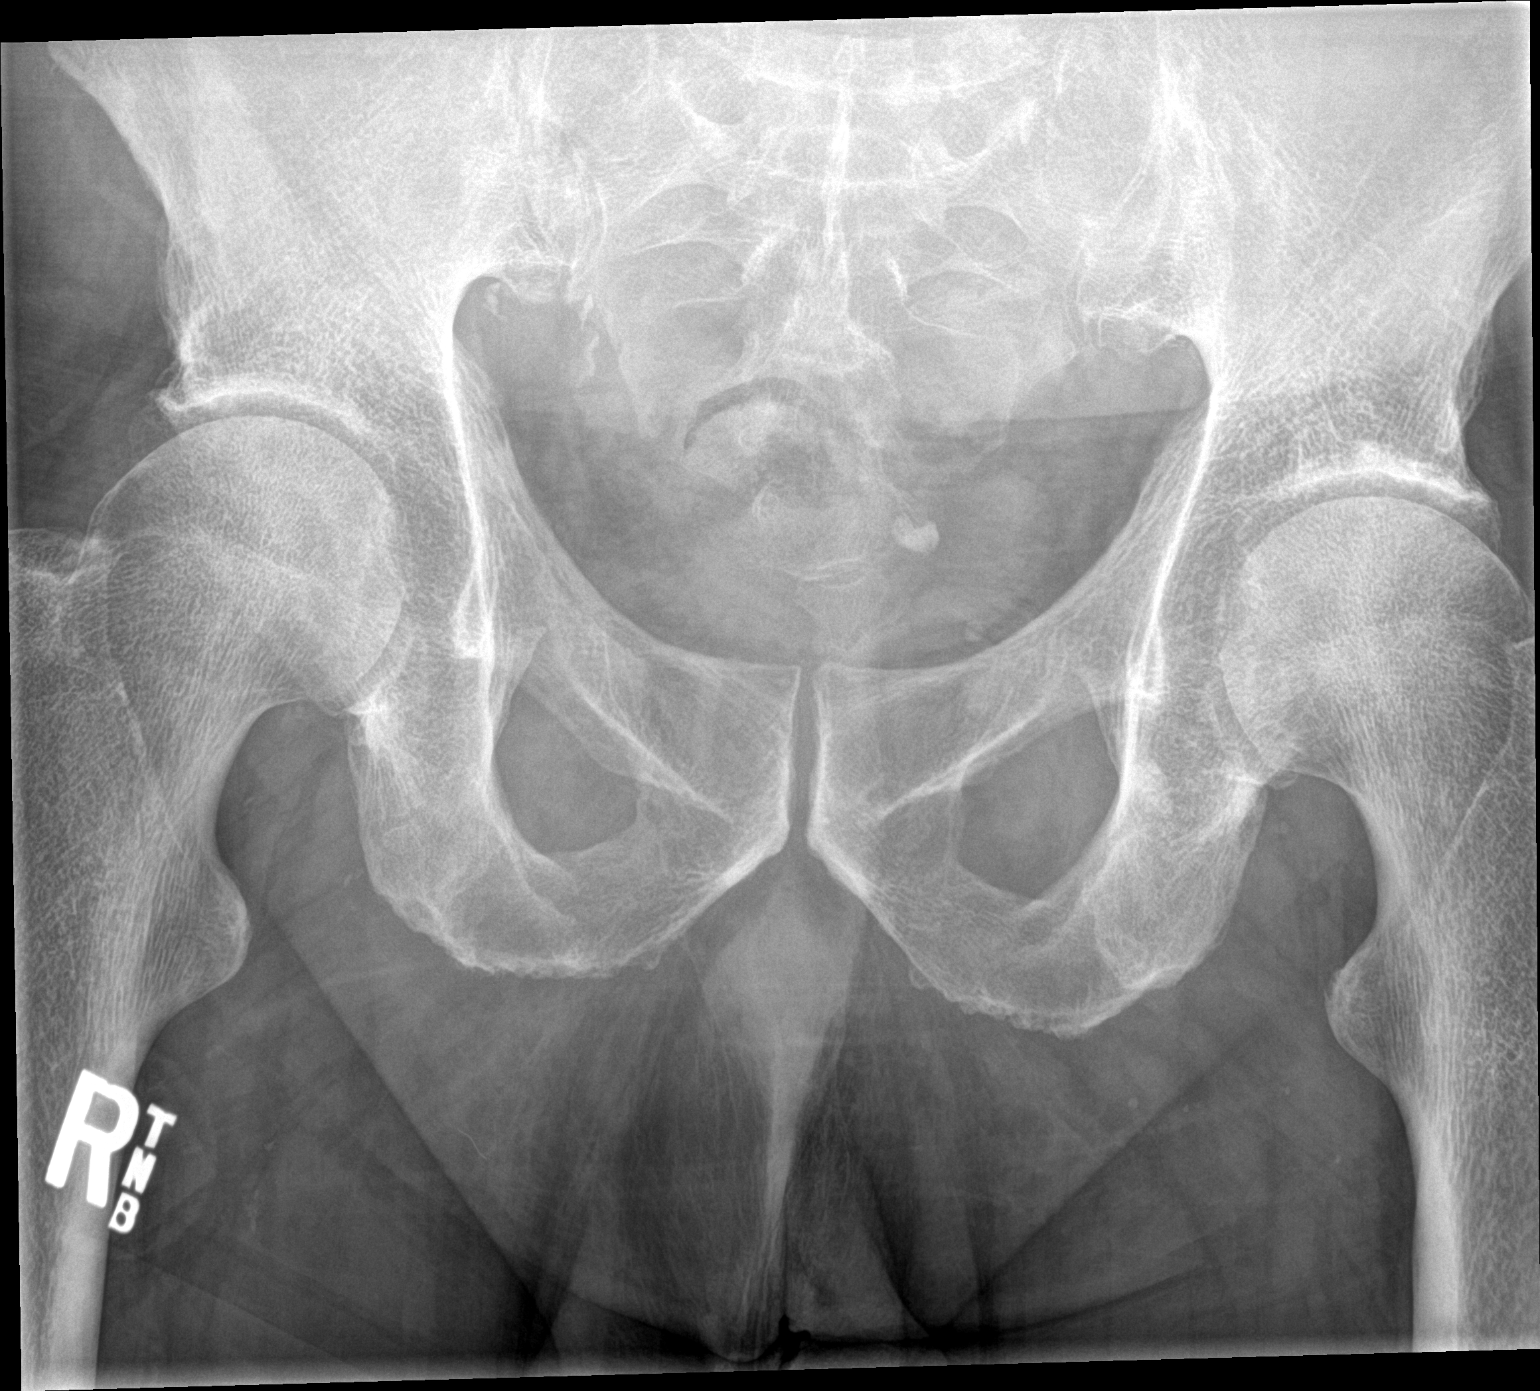

[2 of 2 positions shown; findings below may reference images not displayed]

FINDINGS: Multiple stones in the distal left ureter and lower pole right
kidney are unchanged. Normal bowel gas pattern. No acute osseous
abnormality.
IMPRESSION: 1. Unchanged distal left ureteral calculi and right nephrolithiasis.

## 2020-02-01 ENCOUNTER — Other Ambulatory Visit: Payer: Self-pay | Admitting: Neurology

## 2020-03-05 ENCOUNTER — Ambulatory Visit: Payer: PPO | Admitting: Neurology

## 2020-05-02 ENCOUNTER — Other Ambulatory Visit: Payer: Self-pay | Admitting: Family Medicine

## 2020-05-07 NOTE — Progress Notes (Signed)
Assessment/Plan:   1.  Parkinsons Disease, dx prior to 2013/2014  -Continue carbidopa/levodopa 25/100, 2 tablets 3 times per day  -Continue pramipexole 0.5 mg 3 times per day.  We did talk about potentially increasing it for tremor but decided against it for now.  -discussed dbs surgery but not needed yet and not ready yet  2.  CAD  -Follows with cardiology  3.  Insomnia  -decrease nap time in the day (currently taking 2 hour nap)  -try melatonin 3-8mg  nightly  Follow up is anticipated in the next 4-6 months, sooner should new neurologic issues arise.   Subjective:   Christopher Weeks was seen today in follow up for Parkinsons disease.  My previous records were reviewed prior to todays visit as well as outside records available to me. Pt denies falls.  Pt denies lightheadedness, near syncope.  No hallucinations.  Will sometimes see a shadow at night.  Mood has been good.  Shaking is getting worse.  Walking a mile for exercise.  Playing on Wii for exercise.    Current prescribed movement disorder medications: Carbidopa/levodopa 25/100, 2 tablets 3 times per day Pramipexole 0.5 mg 3 times per day   ALLERGIES:   Allergies  Allergen Reactions  . Codeine Hives    Denies Airway involvement    CURRENT MEDICATIONS:  Outpatient Encounter Medications as of 05/08/2020  Medication Sig  . amLODipine (NORVASC) 10 MG tablet TAKE 1 TABLET BY MOUTH ONCE A DAY.  Marland Kitchen aspirin 81 MG tablet Take 81 mg by mouth daily.   Marland Kitchen atorvastatin (LIPITOR) 80 MG tablet TAKE (1) TABLET BY MOUTH AT BEDTIME.  . Calcium-Vitamin D 600-125 MG-UNIT TABS Take 1 tablet by mouth daily.   . carbidopa-levodopa (SINEMET IR) 25-100 MG tablet TAKE 2 TABLETS BY MOUTH THREE TIMES DAILY.  . citalopram (CELEXA) 40 MG tablet TAKE ONE TABLET BY MOUTH DAILY.  Marland Kitchen clopidogrel (PLAVIX) 75 MG tablet Take 1 tablet (75 mg total) by mouth daily with breakfast.  . esomeprazole (NEXIUM) 20 MG capsule TAKE 1 CAPSULE BY MOUTH EVERY  MORNING.  Marland Kitchen ezetimibe (ZETIA) 10 MG tablet TAKE 1 TABLET BY MOUTH ONCE A DAY.  . famotidine (PEPCID) 20 MG tablet TAKE 1 TABLET BY MOUTH 2 TIMES DAILY.  . isosorbide mononitrate (IMDUR) 60 MG 24 hr tablet TAKE ONE TABLET BY MOUTH DAILY.  Marland Kitchen lisinopril (ZESTRIL) 20 MG tablet TAKE (1) TABLET BY MOUTH ONCE DAILY.  . nitroGLYCERIN (NITROSTAT) 0.4 MG SL tablet Place 1 tablet (0.4 mg total) under the tongue every 5 (five) minutes as needed for chest pain.  . pramipexole (MIRAPEX) 0.5 MG tablet TAKE (1) TABLET BY MOUTH (3) TIMES DAILY.   Facility-Administered Encounter Medications as of 05/08/2020  Medication  . sodium chloride flush (NS) 0.9 % injection 3 mL    Objective:   PHYSICAL EXAMINATION:    VITALS:   Vitals:   05/08/20 1300  BP: 126/72  Pulse: 63  SpO2: 97%  Weight: 207 lb (93.9 kg)  Height: 6' (1.829 m)    GEN:  The patient appears stated age and is in NAD. HEENT:  Normocephalic, atraumatic.  The mucous membranes are moist. The superficial temporal arteries are without ropiness or tenderness. CV:  RRR Lungs:  CTAB Neck/HEME:  There are no carotid bruits bilaterally.  Neurological examination:  Orientation: The patient is alert and oriented x3. Cranial nerves: There is good facial symmetry with facial hypomimia. The speech is fluent and clear. Soft palate rises symmetrically and there is  no tongue deviation. Hearing is intact to conversational tone. Sensation: Sensation is intact to light touch throughout Motor: Strength is at least antigravity x4.  Movement examination: Tone: There is normal tone in the UE/LE Abnormal movements: there is bilateral UE and bilateral LE resting tremor, independent of one another Coordination:  There is decremation with RAM's, with any form of RAMS, including alternating supination and pronation of the forearm, hand opening and closing, finger taps, heel taps and toe taps. Gait and Station: The patient has minimal difficulty arising out of a  deep-seated chair without the use of the hands. The patient's stride length is good.      I have reviewed and interpreted the following labs independently    Chemistry      Component Value Date/Time   NA 138 05/17/2019 0319   NA 141 05/12/2019 1518   K 3.8 05/17/2019 0319   CL 103 05/17/2019 0319   CO2 24 05/17/2019 0319   BUN 15 05/17/2019 0319   BUN 14 05/12/2019 1518   CREATININE 0.98 05/17/2019 0319   CREATININE 0.91 01/31/2016 1136      Component Value Date/Time   CALCIUM 9.5 05/17/2019 0319   ALKPHOS 74 04/15/2018 1805   AST 23 04/15/2018 1805   ALT 8 (L) 04/15/2018 1805   BILITOT 0.9 04/15/2018 1805   BILITOT 0.6 04/08/2018 1052       Lab Results  Component Value Date   WBC 11.7 (H) 05/17/2019   HGB 14.1 05/17/2019   HCT 40.5 05/17/2019   MCV 91.2 05/17/2019   PLT 228 05/17/2019    Lab Results  Component Value Date   TSH 1.80 08/30/2019     Total time spent on today's visit was 30 minutes, including both face-to-face time and nonface-to-face time.  Time included that spent on review of records (prior notes available to me/labs/imaging if pertinent), discussing treatment and goals, answering patient's questions and coordinating care.  Cc:  Zenia Resides, MD

## 2020-05-08 ENCOUNTER — Encounter: Payer: Self-pay | Admitting: Neurology

## 2020-05-08 ENCOUNTER — Ambulatory Visit (INDEPENDENT_AMBULATORY_CARE_PROVIDER_SITE_OTHER): Payer: PPO | Admitting: Neurology

## 2020-05-08 ENCOUNTER — Other Ambulatory Visit: Payer: Self-pay

## 2020-05-08 VITALS — BP 126/72 | HR 63 | Ht 72.0 in | Wt 207.0 lb

## 2020-05-08 DIAGNOSIS — G20 Parkinson's disease: Secondary | ICD-10-CM

## 2020-05-08 NOTE — Patient Instructions (Signed)
1.  decrease nap time in the day (currently taking 2 hour nap) 2.  try melatonin 3-8mg  nightly for insomnia.  Start with the 3 mg first.  The physicians and staff at [facility] are committed to providing excellent care. You may receive a survey requesting feedback about your experience at our office. We strive to receive "very good" responses to the survey questions. If you feel that your experience would prevent you from giving the office a "very good " response, please contact our office to try to remedy the situation. We may be reached at [phone]. Thank you for taking the time out of your busy day to complete the survey.

## 2020-05-30 ENCOUNTER — Other Ambulatory Visit: Payer: Self-pay | Admitting: Family Medicine

## 2020-05-30 ENCOUNTER — Other Ambulatory Visit: Payer: Self-pay | Admitting: Physician Assistant

## 2020-05-30 NOTE — Telephone Encounter (Signed)
Rx has been sent to the pharmacy electronically. ° °

## 2020-07-04 ENCOUNTER — Other Ambulatory Visit: Payer: Self-pay | Admitting: Family Medicine

## 2020-07-05 ENCOUNTER — Other Ambulatory Visit: Payer: Self-pay | Admitting: Cardiovascular Disease

## 2020-07-05 NOTE — Telephone Encounter (Signed)
Rx has been sent to the pharmacy electronically. ° °

## 2020-07-12 ENCOUNTER — Other Ambulatory Visit: Payer: Self-pay

## 2020-07-31 ENCOUNTER — Other Ambulatory Visit: Payer: Self-pay | Admitting: Family Medicine

## 2020-08-30 ENCOUNTER — Other Ambulatory Visit: Payer: Self-pay | Admitting: Cardiovascular Disease

## 2020-08-30 NOTE — Telephone Encounter (Signed)
*  STAT* If patient is at the pharmacy, call can be transferred to refill team.   1. Which medications need to be refilled? (please list name of each medication and dose if known)  isosorbide mononitrate (IMDUR) 60 MG 24 hr tablet  2. Which pharmacy/location (including street and city if local pharmacy) is medication to be sent to? [place]  3. Do they need a 30 day or 90 day supply? 90 day supply   Pt has appt scheduled for the first available 10/17/20.

## 2020-08-31 MED ORDER — ISOSORBIDE MONONITRATE ER 60 MG PO TB24: 60.0000 mg | ORAL_TABLET | Freq: Every day | ORAL | 1 refills | Status: DC

## 2020-10-02 ENCOUNTER — Other Ambulatory Visit: Payer: Self-pay | Admitting: Family Medicine

## 2020-10-02 DIAGNOSIS — I10 Essential (primary) hypertension: Secondary | ICD-10-CM

## 2020-10-14 NOTE — Progress Notes (Signed)
Cardiology Clinic Note   Patient Name: Christopher Weeks Date of Encounter: 10/17/2020  Primary Care Provider:  Zenia Resides, MD Primary Cardiologist:  Sanda Klein, MD  Patient Profile    Christopher Weeks 69 year old male presents to the clinic today for follow-up evaluation of his essential hypertension and coronary artery disease.  Past Medical History    Past Medical History:  Diagnosis Date  . Anxiety   . Coronary artery disease involving native coronary artery 2013   DES PCI (3x30 2 mm Promus) mid RCA; July 2020 PTCA only of proximal Ramus Intermedius  . Depression   . Dysrhythmia   . GERD (gastroesophageal reflux disease)   . Heart murmur   . History of colon polyps   . History of kidney stones   . Hypertension   . Kidney stones   . Myocardial infarction ([place])   . Parkinson disease ([place])   . Parkinson's disease [facility])    Past Surgical History:  Procedure Laterality Date  . anal Energy manager    . CARDIAC CATHETERIZATION  07/11/2012   high graded stenoses small ift circ and small 1st diag   which were  left for medicl therapy, lv systoilc fx preserved  . CORONARY ANGIOPLASTY  07/11/2012   90% and 70% lesions RCA  3.0 x32 mm Promus DES  . CORONARY BALLOON ANGIOPLASTY N/A 05/16/2019   Procedure: CORONARY BALLOON ANGIOPLASTY;  Surgeon: Leonie Man, MD;  Location: [place] [facility];  Service: Cardiovascular;  Laterality: N/A;  . CORONARY STENT PLACEMENT  07/11/12  . EXTRACORPOREAL SHOCK WAVE LITHOTRIPSY Left 04/22/2018   Procedure: LEFT EXTRACORPOREAL SHOCK WAVE LITHOTRIPSY (ESWL);  Surgeon: Cleon Gustin, MD;  Location: [facility];  Service: Urology;  Laterality: Left;  . EXTRACORPOREAL SHOCK WAVE LITHOTRIPSY Left 05/31/2018   Procedure: LEFT EXTRACORPOREAL SHOCK WAVE LITHOTRIPSY (ESWL);  Surgeon: Alexis Frock, MD;  Location: [facility];  Service: Urology;  Laterality: Left;  [phone] [employer] [id number]  . LEFT HEART CATH N/A 07/11/2012    Procedure: LEFT HEART CATH;  Surgeon: Sanda Klein, MD;  Location: [facility];  Service: Cardiovascular;  Laterality: N/A;  . LEFT HEART CATH AND CORONARY ANGIOGRAPHY N/A 05/16/2019   Procedure: LEFT HEART CATH AND CORONARY ANGIOGRAPHY;  Surgeon: Leonie Man, MD;  Location: [place] [facility];  Service: Cardiovascular;  Laterality: N/A;  . PERCUTANEOUS CORONARY STENT INTERVENTION (PCI-S) Right 07/11/2012   Procedure: PERCUTANEOUS CORONARY STENT INTERVENTION (PCI-S);  Surgeon: Sanda Klein, MD;  Location: [facility];  Service: Cardiovascular;  Laterality: Right;    Allergies  Allergies  Allergen Reactions  . Codeine Hives    Denies Airway involvement    History of Present Illness    Mr. Christopher Weeks is a PMH of Parkinson's disease HTN, HLD, and coronary artery disease (inferior MI, received DES to RCA 2013, he was also noted to have high-grade stenosis in small caliber left circumflex and first diagonal, medical management recommended for these lesions).  His nuclear stress test 2017 was normal.  Lipid panel 7/20 showed an LDL of 73.  He is beta-blocker intolerant due to symptomatic bradycardia.  He reported problems with exertional angina.  His symptoms did not improve with long-acting nitrate and the addition of amlodipine 5 mg daily.  He underwent cardiac catheterization 7/20.  He received PTCA in the setting of failed stent placement to the ramus intermedius.  It was noted that his previous stent was widely patent in his RCA.  He had 95% stenosed ostium of  the small AV groove circumflex artery which was too small for PCI.  His LV EF was normal he was noted to have normal filling pressures at the time.  When he was last seen by Dr. Sallyanne Kuster 09/07/2019 he  noted mild occasional swelling in his ankles which resolves overnight.  He denied exertional dyspnea but reported being sedentary.  He follows with neurology who has an encouraged him to increase his physical activity to help decrease his  tremors.  He presents the clinic today for follow-up evaluation states he has noticed left-sided chest pain with increased physical activity.  He states that it happens every time he tries to mow his yard or increases his exertion.  He describes the pain as left-sided dull chest pain that is nonradiating.  It is relieved by rest.  He denies chest pain at rest and sublingual nitroglycerin use.  He states that he is somewhat limited in his physical activity due to his Parkinson's.  I will order a echocardiogram, increase his Imdur, give him a salty 6 diet sheet and have him follow-up in 1 month.  Today he denies chest pain, shortness of breath, lower extremity edema, fatigue, palpitations, melena, hematuria, hemoptysis, diaphoresis, weakness, presyncope, syncope, orthopnea, and PND.   Home Medications    Prior to Admission medications   Medication Sig Start Date End Date Taking? Authorizing Provider  amLODipine (NORVASC) 10 MG tablet TAKE 1 TABLET BY MOUTH ONCE A DAY. 07/31/20   Zenia Resides, MD  aspirin 81 MG tablet Take 81 mg by mouth daily.     [provider]  atorvastatin (LIPITOR) 80 MG tablet TAKE (1) TABLET BY MOUTH AT BEDTIME. 12/01/19   Zenia Resides, MD  Calcium-Vitamin D 600-125 MG-UNIT TABS Take 1 tablet by mouth daily.     [provider]  carbidopa-levodopa (SINEMET IR) 25-100 MG tablet TAKE 2 TABLETS BY MOUTH THREE TIMES DAILY. 10/03/19   Zenia Resides, MD  citalopram (CELEXA) 40 MG tablet TAKE ONE TABLET BY MOUTH DAILY. 12/01/19   Zenia Resides, MD  clopidogrel (PLAVIX) 75 MG tablet TAKE 1 TABLET BY MOUTH ONCE A DAY. 05/30/20   Croitoru, Mihai, MD  esomeprazole (NEXIUM) 20 MG capsule TAKE 1 CAPSULE BY MOUTH EVERY MORNING. 07/04/20   Zenia Resides, MD  ezetimibe (ZETIA) 10 MG tablet TAKE 1 TABLET BY MOUTH ONCE A DAY. 11/01/19   Croitoru, Mihai, MD  famotidine (PEPCID) 20 MG tablet TAKE 1 TABLET BY MOUTH 2 TIMES DAILY. 06/01/20   Zenia Resides, MD   isosorbide mononitrate (IMDUR) 60 MG 24 hr tablet Take 1 tablet (60 mg total) by mouth daily. 08/31/20   Croitoru, Mihai, MD  lisinopril (ZESTRIL) 20 MG tablet TAKE (1) TABLET BY MOUTH ONCE DAILY. 10/02/20   McDiarmid, Blane Ohara, MD  nitroGLYCERIN (NITROSTAT) 0.4 MG SL tablet Place 1 tablet (0.4 mg total) under the tongue every 5 (five) minutes as needed for chest pain. 05/12/19 05/08/20  Croitoru, Mihai, MD  pramipexole (MIRAPEX) 0.5 MG tablet TAKE (1) TABLET BY MOUTH (3) TIMES DAILY. 02/02/20   Tat, Eustace Quail, DO    Family History    Family History  Adopted: Yes  Problem Relation Age of Onset  . Cancer Mother        bone; kidney  . Cancer Father   . Heart attack Father    is adopted.   Social History    Social History   Socioeconomic History  . Marital status: Married    Spouse  name: Vickie  . Number of children: 2  . Years of education: 22  . Highest education level: Not on file  Occupational History  . Occupation: Disabled-maintenance work  Tobacco Use  . Smoking status: Former Smoker    Packs/day: 1.00    Years: 20.00    Pack years: 20.00    Types: Cigarettes    Quit date: 11/10/1996    Years since quitting: 23.9  . Smokeless tobacco: Never Used  Vaping Use  . Vaping Use: Never used  Substance and Sexual Activity  . Alcohol use: No    Alcohol/week: 0.0 standard drinks  . Drug use: Yes    Types: Marijuana    Comment: not using, uses CBD oil now- 04/21/18  . Sexual activity: Yes    Comment: monogamous  Other Topics Concern  . Not on file  Social History Narrative   Live with wife 1 level home   Right handed   Caffeine - coffee 1 cup in am and tea with meals   Education - 11th grade   Social Determinants of Health   Financial Resource Strain:   . Difficulty of Paying Living Expenses: Not on file  Food Insecurity:   . Worried About Charity fundraiser in the Last Year: Not on file  . Ran Out of Food in the Last Year: Not on file  Transportation Needs:   .  Lack of Transportation (Medical): Not on file  . Lack of Transportation (Non-Medical): Not on file  Physical Activity:   . Days of Exercise per Week: Not on file  . Minutes of Exercise per Session: Not on file  Stress:   . Feeling of Stress : Not on file  Social Connections:   . Frequency of Communication with Friends and Family: Not on file  . Frequency of Social Gatherings with Friends and Family: Not on file  . Attends Religious Services: Not on file  . Active Member of Clubs or Organizations: Not on file  . Attends Archivist Meetings: Not on file  . Marital Status: Not on file  Intimate Partner Violence:   . Fear of Current or Ex-Partner: Not on file  . Emotionally Abused: Not on file  . Physically Abused: Not on file  . Sexually Abused: Not on file     Review of Systems    General:  No chills, fever, night sweats or weight changes.  Cardiovascular:  No chest pain, dyspnea on exertion, edema, orthopnea, palpitations, paroxysmal nocturnal dyspnea. Dermatological: No rash, lesions/masses Respiratory: No cough, dyspnea Urologic: No hematuria, dysuria Abdominal:   No nausea, vomiting, diarrhea, bright red blood per rectum, melena, or hematemesis Neurologic:  No visual changes, wkns, changes in mental status. All other systems reviewed and are otherwise negative except as noted above.  Physical Exam    VS:  BP 118/62   Pulse (!) 58   Ht 6' (1.829 m)   Wt 209 lb 3.2 oz (94.9 kg)   SpO2 95%   BMI 28.37 kg/m  , BMI Body mass index is 28.37 kg/m. GEN: Well nourished, well developed, in no acute distress. HEENT: normal. Neck: Supple, no JVD, carotid bruits, or masses. Cardiac: RRR, no murmurs, rubs, or gallops. No clubbing, cyanosis, edema.  Radials/DP/PT 2+ and equal bilaterally.  Respiratory:  Respirations regular and unlabored, clear to auscultation bilaterally. GI: Soft, nontender, nondistended, BS + x 4. MS: no deformity or atrophy. Skin: warm and dry, no  rash. Neuro:  Strength and sensation are intact.  Psych: Normal affect.  Accessory Clinical Findings    Recent Labs: No results found for requested labs within last 8760 hours.   Recent Lipid Panel    Component Value Date/Time   CHOL 135 05/12/2019 1518   TRIG 138 05/12/2019 1518   HDL 34 (L) 05/12/2019 1518   CHOLHDL 4.0 05/12/2019 1518   CHOLHDL 4.7 02/12/2016 1000   VLDL 34 (H) 02/12/2016 1000   LDLCALC 73 05/12/2019 1518    ECG personally reviewed by me today-sinus bradycardia with premature atrial complexes possible lateral infarct undetermined age 48 bpm- No acute changes  EKG 09/07/2019 Normal sinus rhythm no ST or T wave deviation, normal QTC just under 450 ms  Cardiac catheterization 05/16/2019  TARGET LESION Ramus-2 lesion is 80% stenosed.  PTCA: Balloon angioplasty was performed on the 80% stenosis using a BALLOON SAPPHIRE 2.0X15. Unable to pass stent. Post intervention, there is a 30% residual stenosis.  Ost Cx to Prox Cx lesion is 99% stenosed -stable, plan for medical management  Previously placed Prox RCA to Mid RCA stent (unknown type) is widely patent.  -------------------------------------------------------------  The left ventricular systolic function is normal. The left ventricular ejection fraction is 55-65% by visual estimate.  LV end diastolic pressure is normal.   SUMMARY  Three-vessel CAD: Widely patent mid RCA stent, 95% ostial small AV groove Circumflex (too small for PCI), 80% proximal Ramus Intermedius  Unsuccessful PCI, PTCA only of proximal Ramus Intermedius lesion reducing from 80% to 30%.  Normal LVEF with normal LVEDP but severely elevated systemic pressures.  RECOMMENDATIONS  Because of significant comorbidities, the patient we monitored overnight for post PCI evaluation.  TR band removal per protocol  Run ACT for 2 hours post PCI and then stop  Anticipate discharge tomorrow if stable -> would likely consider Ranexa if an option  given his Parkinson's.  Diagnostic Dominance: Right  Intervention        Assessment & Plan   1.  Coronary artery disease-reports chest pain with increased physical activity he describes it as left-sided and dull.  It is relieved with rest.  He denies chest pain at rest.    Underwent cardiac catheterization 7/20.  He received PTCA in the setting of failed stent placement to the ramus intermedius.  It was noted that his previous stent was widely patent in his RCA.  He had 95% stenosed ostium of the small AV groove circumflex artery which was too small for PCI.  Again reviewed having a low threshold for seeking emergency medical attention.  Also reviewed sublingual nitro use.  Not a candidate for beta-blocker due to symptomatic bradycardia. Continue  amlodipine, aspirin, Plavix Increase Imdur to 120 mg Heart healthy low-sodium diet-salty 6 given Increase physical activity as tolerated Repeat echocardiogram  Essential hypertension-BP today 118/62.  Continues to be well controlled at home but somewhat labile at times. Continue amlodipine, lisinopril, Imdur Heart healthy low-sodium diet-salty 6 given Increase physical activity as tolerated  Hyperlipidemia-LDL 73 on 05/12/19 Continue ezetimibe, atorvastatin, aspirin Heart healthy low-sodium diet-salty 6 given Increase physical activity as tolerated  Parkinson's disease-tremors have continued to progress.  Does notice reduction in tremor activity with increased physical activity. Increase physical activity as tolerated Follows with neurology  Disposition: Follow-up with Dr. Sallyanne Kuster or me in 1 months.    Jossie Ng. Josilynn Losh NP-C    10/17/2020, 10:31 AM [address] Office [phone] Fax [phone]  Notice: This dictation was prepared with Dragon dictation along with smaller  Company secretary. Any transcriptional errors that result from this process are unintentional and may  not be corrected upon review.

## 2020-10-17 ENCOUNTER — Encounter: Payer: Self-pay | Admitting: General Practice

## 2020-10-17 ENCOUNTER — Other Ambulatory Visit: Payer: Self-pay

## 2020-10-17 ENCOUNTER — Ambulatory Visit (INDEPENDENT_AMBULATORY_CARE_PROVIDER_SITE_OTHER): Payer: PPO | Admitting: General Practice

## 2020-10-17 VITALS — BP 118/62 | HR 58 | Ht 72.0 in | Wt 209.2 lb

## 2020-10-17 DIAGNOSIS — G20 Parkinson's disease: Secondary | ICD-10-CM | POA: Diagnosis not present

## 2020-10-17 DIAGNOSIS — I10 Essential (primary) hypertension: Secondary | ICD-10-CM

## 2020-10-17 DIAGNOSIS — I25118 Atherosclerotic heart disease of native coronary artery with other forms of angina pectoris: Secondary | ICD-10-CM

## 2020-10-17 DIAGNOSIS — E785 Hyperlipidemia, unspecified: Secondary | ICD-10-CM

## 2020-10-17 MED ORDER — ISOSORBIDE MONONITRATE ER 120 MG PO TB24: 120.0000 mg | ORAL_TABLET | Freq: Every day | ORAL | 11 refills | Status: DC

## 2020-10-17 NOTE — Patient Instructions (Signed)
Medication Instructions:  INCREASE ISOSORBIDE 120MG  DAILY  *If you need a refill on your cardiac medications before your next appointment, please call your pharmacy*  Lab Work:   Testing/Procedures:  NONE    NONE  Special Instructions  PLEASE READ AND FOLLOW SALTY 6-ATTACHED-1,800mg  daily  Echocardiogram - Your physician has requested that you have an echocardiogram. Echocardiography is a painless test that uses sound waves to create images of your heart. It provides your doctor with information about the size and shape of your heart and how well your heart's chambers and valves are working. This procedure takes approximately one hour. There are no restrictions for this procedure. This will be performed at our [facility] location - [address].  Follow-Up: Your next appointment:  1 month(s) In Person with Peter Martinique, MD OR IF UNAVAILABLE Coletta Memos, FNP-C   At [facility], you and your health needs are our priority.  As part of our continuing mission to provide you with exceptional heart care, we have created designated Provider Care Teams.  These Care Teams include your primary Cardiologist (physician) and Advanced Practice Providers (APPs -  Physician Assistants and Nurse Practitioners) who all work together to provide you with the care you need, when you need it.            6 SALTY THINGS TO AVOID     1,800MG  DAILY

## 2020-10-23 ENCOUNTER — Other Ambulatory Visit: Payer: Self-pay

## 2020-10-23 ENCOUNTER — Ambulatory Visit (HOSPITAL_COMMUNITY)
Admission: RE | Admit: 2020-10-23 | Discharge: 2020-10-23 | Disposition: A | Discharge status: Home / Self Care | Payer: PPO | Source: Ambulatory Visit | Attending: General Practice | Admitting: General Practice

## 2020-10-23 DIAGNOSIS — I10 Essential (primary) hypertension: Secondary | ICD-10-CM | POA: Insufficient documentation

## 2020-10-23 DIAGNOSIS — I25118 Atherosclerotic heart disease of native coronary artery with other forms of angina pectoris: Secondary | ICD-10-CM | POA: Diagnosis not present

## 2020-10-23 DIAGNOSIS — E785 Hyperlipidemia, unspecified: Secondary | ICD-10-CM | POA: Insufficient documentation

## 2020-10-23 DIAGNOSIS — G20 Parkinson's disease: Secondary | ICD-10-CM | POA: Diagnosis not present

## 2020-10-23 LAB — ECHOCARDIOGRAM COMPLETE
AR max vel: 2.39 cm2
AV Area VTI: 2.52 cm2
AV Area mean vel: 2.7 cm2
AV Mean grad: 8 mmHg
AV Peak grad: 16.2 mmHg
Ao pk vel: 2.01 m/s
Area-P 1/2: 2.9 cm2
S' Lateral: 2.5 cm

## 2020-10-23 NOTE — Progress Notes (Signed)
*  PRELIMINARY RESULTS* Echocardiogram 2D Echocardiogram has been performed.  Christopher Weeks 10/23/2020, 11:26 AM

## 2020-10-29 ENCOUNTER — Other Ambulatory Visit: Payer: Self-pay | Admitting: Family Medicine

## 2020-10-29 ENCOUNTER — Other Ambulatory Visit: Payer: Self-pay | Admitting: Cardiovascular Disease

## 2020-10-29 DIAGNOSIS — I10 Essential (primary) hypertension: Secondary | ICD-10-CM

## 2020-11-16 ENCOUNTER — Ambulatory Visit: Payer: PPO | Admitting: Neurology

## 2020-11-25 NOTE — Progress Notes (Signed)
Virtual Visit via Telephone Note   This visit type was conducted due to national recommendations for restrictions regarding the COVID-19 Pandemic (e.g. social distancing) in an effort to limit this patient's exposure and mitigate transmission in our community.  Due to his co-morbid illnesses, this patient is at least at moderate risk for complications without adequate follow up.  This format is felt to be most appropriate for this patient at this time.  The patient did not have access to video technology/had technical difficulties with video requiring transitioning to audio format only (telephone).  All issues noted in this document were discussed and addressed.  No physical exam could be performed with this format.  Please refer to the patient's chart for his  consent to telehealth for [facility].  Evaluation Performed:  Follow-up visit  This visit type was conducted due to national recommendations for restrictions regarding the COVID-19 Pandemic (e.g. social distancing).  This format is felt to be most appropriate for this patient at this time.  All issues noted in this document were discussed and addressed.  No physical exam was performed (except for noted visual exam findings with Video Visits).  Please refer to the patient's chart (MyChart message for video visits and phone note for telephone visits) for the patient's consent to telehealth for [place]  Date:  11/25/2020   ID:  Christopher Weeks, DOB 01/02/1951, MRN [id number]  Patient Location:  [address] [place] [address]   Provider location:     [address] Office [phone] Fax [phone]   PCP:  Zenia Resides, MD  Cardiologist:  Sanda Klein, MD  Electrophysiologist:  None   Chief Complaint: Follow-up for essential hypertension and coronary artery disease  History of Present Illness:    Christopher Weeks is a 70 y.o. male who  presents via audio/video conferencing for a telehealth visit today.  Patient verified DOB and address.  Christopher Weeks is a PMH of Parkinson's disease HTN, HLD, and coronary artery disease (inferior MI, received DES to RCA 2013, he was also noted to have high-grade stenosis in small caliber left circumflex and first diagonal, medical management recommended for these lesions).  His nuclear stress test 2017 was normal.  Lipid panel 7/20 showed an LDL of 73.  He is beta-blocker intolerant due to symptomatic bradycardia.  He reported problems with exertional angina.  His symptoms did not improve with long-acting nitrate and the addition of amlodipine 5 mg daily.  He underwent cardiac catheterization 7/20.  He received PTCA in the setting of failed stent placement to the ramus intermedius.  It was noted that his previous stent was widely patent in his RCA.  He had 95% stenosed ostium of the small AV groove circumflex artery which was too small for PCI.  His LV EF was normal he was noted to have normal filling pressures at the time.  When he was last seen by Dr. Sallyanne Kuster 09/07/2019 he  noted mild occasional swelling in his ankles which resolves overnight.  He denied exertional dyspnea but reported being sedentary.  He follows with neurology who has an encouraged him to increase his physical activity to help decrease his tremors.  He presented to the clinic 10/17/2020 for follow-up evaluation stated he had noticed left-sided chest pain with increased physical activity.  He stated that it happened every time he tried to mow his yard or increases his exertion.  He described the pain as  left-sided dull chest pain that was nonradiating.  It was relieved by rest.  He denies chest pain at rest and sublingual nitroglycerin use.  He stated that he was somewhat limited in his physical activity due to his Parkinson's.  I  ordered a echocardiogram, increased his Imdur, gave him a salty 6 diet sheet and planned  follow-up for 1  month.  His echocardiogram showed an LVEF of 70-75%, mild LVH, normal diastolic parameters, mildly dilated left atria, and no significant valvular abnormalities.  He is seen virtually today for follow-up evaluation and states he feels well today.  He states that he no longer has chest discomfort.  He has continued his daily physical activities however, he states he does not go out in the colder temperatures.  He has been trying to watch his diet and stay away from salt.  However, he states he does not eat very much and he does not watch his sodium intake very close.  He reports compliance with his increased Imdur.  He denies side effects related to the medication.  I will give him the salty 6 diet sheet, have him continue to increase his physical activity as tolerated, have him follow-up with Dr. Sallyanne Kuster in 6 months.  Today he denies chest pain, shortness of breath, lower extremity edema, fatigue, palpitations, melena, hematuria, hemoptysis, diaphoresis, weakness, presyncope, syncope, orthopnea, and PND.  The patient does not symptoms concerning for COVID-19 infection (fever, chills, cough, or new SHORTNESS OF BREATH).    Prior CV studies:   The following studies were reviewed today:  Echocardiogram 10/23/2020 IMPRESSIONS    1. Left ventricular ejection fraction, by estimation, is 70 to 75%. The  left ventricle has hyperdynamic function. The left ventricle has no  regional wall motion abnormalities. There is mild left ventricular  hypertrophy. Left ventricular diastolic  parameters were normal.  2. Right ventricular systolic function is normal. The right ventricular  size is normal.  3. Left atrial size was mildly dilated.  4. The mitral valve is normal in structure. No evidence of mitral valve  regurgitation. No evidence of mitral stenosis.  5. The aortic valve has an indeterminant number of cusps. Aortic valve  regurgitation is not visualized. No aortic stenosis is  present.  sinus bradycardia with premature atrial complexes possible lateral infarct undetermined age 45 bpm- No acute changes  EKG 09/07/2019 Normal sinus rhythm no ST or T wave deviation, normal QTC just under 450 ms  Cardiac catheterization 05/16/2019  TARGET LESION Ramus-2 lesion is 80% stenosed.  PTCA: Balloon angioplasty was performed on the 80% stenosis using a BALLOON SAPPHIRE 2.0X15. Unable to pass stent. Post intervention, there is a 30% residual stenosis.  Ost Cx to Prox Cx lesion is 99% stenosed -stable, plan for medical management  Previously placed Prox RCA to Mid RCA stent (unknown type) is widely patent.  -------------------------------------------------------------  The left ventricular systolic function is normal. The left ventricular ejection fraction is 55-65% by visual estimate.  LV end diastolic pressure is normal.  SUMMARY  Three-vessel CAD: Widely patent mid RCA stent, 95% ostial small AV groove Circumflex (too small for PCI), 80% proximal Ramus Intermedius  Unsuccessful PCI, PTCA only of proximal Ramus Intermedius lesion reducing from 80% to 30%.  Normal LVEF with normal LVEDP but severely elevated systemic pressures.  RECOMMENDATIONS  Because of significant comorbidities, the patient we monitored overnight for post PCI evaluation. TR band removal per protocol  Run ACT for 2 hours post PCI and then stop  Anticipate discharge tomorrow  if stable -> would likely consider Ranexa if an option given his Parkinson's.  Diagnostic Dominance: Right  Intervention      Past Medical History:  Diagnosis Date  . Anxiety   . Coronary artery disease involving native coronary artery 2013   DES PCI (3x30 2 mm Promus) mid RCA; July 2020 PTCA only of proximal Ramus Intermedius  . Depression   . Dysrhythmia   . GERD (gastroesophageal reflux disease)   . Heart murmur   . History of colon polyps   . History of kidney stones   . Hypertension   .  Kidney stones   . Myocardial infarction ([place])   . Parkinson disease ([place])   . Parkinson's disease [facility])    Past Surgical History:  Procedure Laterality Date  . anal Energy manager    . CARDIAC CATHETERIZATION  07/11/2012   high graded stenoses small ift circ and small 1st diag   which were  left for medicl therapy, lv systoilc fx preserved  . CORONARY ANGIOPLASTY  07/11/2012   90% and 70% lesions RCA  3.0 x32 mm Promus DES  . CORONARY BALLOON ANGIOPLASTY N/A 05/16/2019   Procedure: CORONARY BALLOON ANGIOPLASTY;  Surgeon: Leonie Man, MD;  Location: [facility];  Service: Cardiovascular;  Laterality: N/A;  . CORONARY STENT PLACEMENT  07/11/12  . EXTRACORPOREAL SHOCK WAVE LITHOTRIPSY Left 04/22/2018   Procedure: LEFT EXTRACORPOREAL SHOCK WAVE LITHOTRIPSY (ESWL);  Surgeon: Cleon Gustin, MD;  Location: [facility];  Service: Urology;  Laterality: Left;  . EXTRACORPOREAL SHOCK WAVE LITHOTRIPSY Left 05/31/2018   Procedure: LEFT EXTRACORPOREAL SHOCK WAVE LITHOTRIPSY (ESWL);  Surgeon: Alexis Frock, MD;  Location: [facility];  Service: Urology;  Laterality: Left;  [phone] [employer] [id number]  . LEFT HEART CATH N/A 07/11/2012   Procedure: LEFT HEART CATH;  Surgeon: Sanda Klein, MD;  Location: [facility] CATH LAB;  Service: Cardiovascular;  Laterality: N/A;  . LEFT HEART CATH AND CORONARY ANGIOGRAPHY N/A 05/16/2019   Procedure: LEFT HEART CATH AND CORONARY ANGIOGRAPHY;  Surgeon: Leonie Man, MD;  Location: [facility];  Service: Cardiovascular;  Laterality: N/A;  . PERCUTANEOUS CORONARY STENT INTERVENTION (PCI-S) Right 07/11/2012   Procedure: PERCUTANEOUS CORONARY STENT INTERVENTION (PCI-S);  Surgeon: Sanda Klein, MD;  Location: [facility] CATH LAB;  Service: Cardiovascular;  Laterality: Right;     No outpatient medications have been marked as taking for the 11/26/20 encounter (Appointment) with Deberah Pelton, NP.   Current Facility-Administered Medications for the 11/26/20  encounter (Appointment) with Deberah Pelton, NP  Medication  . sodium chloride flush (NS) 0.9 % injection 3 mL     Allergies:   Codeine   Social History   Tobacco Use  . Smoking status: Former Smoker    Packs/day: 1.00    Years: 20.00    Pack years: 20.00    Types: Cigarettes    Quit date: 11/10/1996    Years since quitting: 24.0  . Smokeless tobacco: Never Used  Vaping Use  . Vaping Use: Never used  Substance Use Topics  . Alcohol use: No    Alcohol/week: 0.0 standard drinks  . Drug use: Yes    Types: Marijuana    Comment: not using, uses CBD oil now- 04/21/18     Family Hx: The patient's family history includes Cancer in his father and mother; Heart attack in his father. He was adopted.  ROS:   Please see the history of present illness.     All other systems reviewed and  are negative.   Labs/Other Tests and Data Reviewed:    Recent Labs: No results found for requested labs within last 8760 hours.   Recent Lipid Panel Lab Results  Component Value Date/Time   CHOL 135 05/12/2019 03:18 PM   TRIG 138 05/12/2019 03:18 PM   HDL 34 (L) 05/12/2019 03:18 PM   CHOLHDL 4.0 05/12/2019 03:18 PM   CHOLHDL 4.7 02/12/2016 10:00 AM   LDLCALC 73 05/12/2019 03:18 PM    Wt Readings from Last 3 Encounters:  10/17/20 209 lb 3.2 oz (94.9 kg)  05/08/20 207 lb (93.9 kg)  09/07/19 190 lb (86.2 kg)     Exam:    Vital Signs:  There were no vitals taken for this visit.   Well nourished, well developed male in no  acute distress.   ASSESSMENT & PLAN:    1.  Coronary artery disease- no chest pain today.  Significant improvement with increased Imdur.  Echocardiogram showed EF 70-75%.  Details above.   Underwent cardiac catheterization 7/20.  He received PTCA in the setting of failed stent placement to the ramus intermedius.  It was noted that his previous stent was widely patent in his RCA.  He had 95% stenosed ostium of the small AV groove circumflex artery which was too small  for PCI.   Not a candidate for beta-blocker due to symptomatic bradycardia. Continue  amlodipine, aspirin, Plavix Continue Imdur to 120 mg Heart healthy low-sodium diet-salty 6 given Increase physical activity as tolerated   Essential hypertension-BP today  unavailable.   Continue amlodipine, lisinopril, Imdur Heart healthy low-sodium diet-salty 6 given Increase physical activity as tolerated  Hyperlipidemia-LDL 73 on 05/12/19 Continue ezetimibe, atorvastatin, aspirin Heart healthy low-sodium diet-salty 6 given Increase physical activity as tolerated Repeat fasting lipid and liver  Parkinson's disease- stable.  Does notice reduction in tremor activity with increased physical activity. Increase physical activity as tolerated Follows with neurology  Disposition: Follow-up with Dr. Sallyanne Kuster or me in 6 months.  COVID-19 Education: The signs and symptoms of COVID-19 were discussed with the patient and how to seek care for testing (follow up with PCP or arrange E-visit).  The importance of social distancing was discussed today.  Patient Risk:   After full review of this patients clinical status, I feel that they are at least moderate risk at this time.  Time:   Today, I have spent 6 minutes with the patient with telehealth technology discussing diet, exercise, medication, echocardiogram.  I spent greater than 20 minutes reviewing his past medical history, cardiac test, and current medications.   Medication Adjustments/Labs and Tests Ordered: Current medicines are reviewed at length with the patient today.  Concerns regarding medicines are outlined above.   Tests Ordered: No orders of the defined types were placed in this encounter.  Medication Changes: No orders of the defined types were placed in this encounter.   Disposition:  in 6 month(s)  Signed, Jossie Ng. Heru Montz NP-C    06/14/2019 11:58 AM    [facility] [address] Office  [phone] Fax [phone]

## 2020-11-26 ENCOUNTER — Encounter: Payer: Self-pay | Admitting: General Practice

## 2020-11-26 ENCOUNTER — Telehealth (INDEPENDENT_AMBULATORY_CARE_PROVIDER_SITE_OTHER): Payer: PPO | Admitting: General Practice

## 2020-11-26 VITALS — Ht 72.0 in | Wt 209.0 lb

## 2020-11-26 DIAGNOSIS — I10 Essential (primary) hypertension: Secondary | ICD-10-CM | POA: Diagnosis not present

## 2020-11-26 DIAGNOSIS — G20 Parkinson's disease: Secondary | ICD-10-CM | POA: Diagnosis not present

## 2020-11-26 DIAGNOSIS — Z79899 Other long term (current) drug therapy: Secondary | ICD-10-CM | POA: Diagnosis not present

## 2020-11-26 DIAGNOSIS — E785 Hyperlipidemia, unspecified: Secondary | ICD-10-CM | POA: Diagnosis not present

## 2020-11-26 DIAGNOSIS — I25118 Atherosclerotic heart disease of native coronary artery with other forms of angina pectoris: Secondary | ICD-10-CM | POA: Diagnosis not present

## 2020-11-26 NOTE — Addendum Note (Signed)
Addended by: Waylan Rocher on: 11/26/2020 09:37 AM   Modules accepted: Orders

## 2020-11-26 NOTE — Progress Notes (Signed)
TY

## 2020-11-26 NOTE — Patient Instructions (Addendum)
  Medication Instructions:  The current medical regimen is effective;  continue present plan and medications as directed. Please refer to the Current Medication list given to you today.  *If you need a refill on your cardiac medications before your next appointment, please call your pharmacy*  Lab Work: FASTING LIPID AND LFT 2 WEEKS 12-11-2019 If you have labs (blood work) drawn today and your tests are completely normal, you will receive your results only by:  [place] (if you have MyChart) OR A paper copy in the mail.  If you have any lab test that is abnormal or we need to change your treatment, we will call you to review the results. You may go to any [facility] that is convenient for you however, we do have a lab in our office that is able to assist you. You DO NOT need an appointment for our lab. The lab is open 8:00am and closes at 4:00pm. Lunch 12:45 - 1:45pm.  Special Instructions PLEASE READ AND FOLLOW SALTY 6-ATTACHED-1,800 mg daily  PLEASE INCREASE PHYSICAL ACTIVITY AS TOLERATED  Follow-Up: Your next appointment:  6 month(s) In Person with Sanda Klein, MD OR IF UNAVAILABLE JESSE CLEAVER, FNP-C   Please call our office 2 months in advance to schedule this appointment   At [facility], you and your health needs are our priority.  As part of our continuing mission to provide you with exceptional heart care, we have created designated Provider Care Teams.  These Care Teams include your primary Cardiologist (physician) and Advanced Practice Providers (APPs -  Physician Assistants and Nurse Practitioners) who all work together to provide you with the care you need, when you need it.

## 2020-11-29 ENCOUNTER — Other Ambulatory Visit: Payer: Self-pay | Admitting: Family Medicine

## 2020-11-29 ENCOUNTER — Other Ambulatory Visit: Payer: Self-pay | Admitting: Cardiovascular Disease

## 2020-11-29 DIAGNOSIS — F321 Major depressive disorder, single episode, moderate: Secondary | ICD-10-CM

## 2020-12-14 NOTE — Progress Notes (Signed)
Assessment/Plan:   1.  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  -increase pramipexole 0.5 mg to 2/2/1 (from 1 po tid)  -discussed dbs/surgical interventions.  I talked to the patient about the logistics associated with DBS therapy.  I talked to the patient about risks/benefits/side effects of DBS therapy.  We talked about risks which included but were not limited to infection, paralysis, intraoperative seizure, death, stroke, bleeding around the electrode.   I talked to patient about fiducial placement 1 week prior to DBS therapy.  I talked to the patient about what to expect in the operating room, including the fact that this is an awake surgery.  We talked about battery placement as well as which is done under general anesthesia, generally approximately one week following the initial surgery.  We also talked about the fact that the patient will need to be off of medications for surgery.  The patient and family were given the opportunity to ask questions, which they did, and I answered them to the best of my ability today.  He wants to talk this over with his wife.  Pt education given  2.  Insomnia  -start mirtazapine, 15 mg q hs.  R/B/SE were discussed.  The opportunity to ask questions was given and they were answered to the best of my ability.  The patient expressed understanding and willingness to follow the outlined treatment protocols.  -discussed potential interaction with celexa.  QT/QTc nl 12/8//21  -avoid napping   Subjective:   Christopher Weeks was seen today in follow up for Parkinsons disease.  My previous records were reviewed prior to todays visit as well as outside records available to me. Pt denies falls.  Pt denies lightheadedness, near syncope.  No hallucinations.  Mood has been good with celexa.  Last visit, he was told me about insomnia and I told him he needed to decrease his 2-hour nap  during the day, and try some melatonin at night.  He reports that "I cannot sleep."  Some of this is b/c of bathroom and some is because of tremor.  States that is still sleeping in day but trying to limit nap to 1 hour per day.  Doing Wii move for exercise.  Cardiology records reviewed from the nurse practitioner from December as well as January.  Current prescribed movement disorder medications: Carbidopa/levodopa 25/100, 2 tablets 3 times per day Pramipexole 0.5 mg tid   PREVIOUS MEDICATIONS: Sinemet and Mirapex  ALLERGIES:   Allergies  Allergen Reactions  . Codeine Hives    Denies Airway involvement    CURRENT MEDICATIONS:  Outpatient Encounter Medications as of 12/18/2020  Medication Sig  . clopidogrel (PLAVIX) 75 MG tablet TAKE 1 TABLET BY MOUTH ONCE A DAY.  Marland Kitchen ezetimibe (ZETIA) 10 MG tablet TAKE 1 TABLET BY MOUTH ONCE A DAY.  Marland Kitchen amLODipine (NORVASC) 10 MG tablet TAKE 1 TABLET BY MOUTH ONCE A DAY.  Marland Kitchen aspirin 81 MG tablet Take 81 mg by mouth daily.  Marland Kitchen atorvastatin (LIPITOR) 80 MG tablet TAKE (1) TABLET BY MOUTH AT BEDTIME.  . Calcium-Vitamin D 600-125 MG-UNIT TABS Take 1 tablet by mouth daily.   . carbidopa-levodopa (SINEMET IR) 25-100 MG tablet TAKE 2 TABLETS BY MOUTH THREE TIMES DAILY.  . citalopram (CELEXA) 40 MG tablet TAKE ONE TABLET BY MOUTH DAILY.  Marland Kitchen esomeprazole (NEXIUM) 20 MG capsule TAKE 1 CAPSULE BY MOUTH EVERY MORNING.  . famotidine (PEPCID) 20 MG  tablet TAKE 1 TABLET BY MOUTH 2 TIMES DAILY.  . isosorbide mononitrate (IMDUR) 120 MG 24 hr tablet Take 1 tablet (120 mg total) by mouth daily.  Marland Kitchen lisinopril (ZESTRIL) 20 MG tablet TAKE (1) TABLET BY MOUTH ONCE DAILY.  . nitroGLYCERIN (NITROSTAT) 0.4 MG SL tablet Place 1 tablet (0.4 mg total) under the tongue every 5 (five) minutes as needed for chest pain.  . pramipexole (MIRAPEX) 0.5 MG tablet TAKE (1) TABLET BY MOUTH (3) TIMES DAILY.   Facility-Administered Encounter Medications as of 12/18/2020  Medication  . sodium  chloride flush (NS) 0.9 % injection 3 mL    Objective:   PHYSICAL EXAMINATION:    VITALS:   Vitals:   12/18/20 1034  Weight: 210 lb (95.3 kg)  Height: 6' (1.829 m)    GEN:  The patient appears stated age and is in NAD. HEENT:  Normocephalic, atraumatic.  The mucous membranes are moist. The superficial temporal arteries are without ropiness or tenderness. CV:  RRR Lungs:  CTAB Neck/HEME:  There are no carotid bruits bilaterally.  Neurological examination:  Orientation: The patient is alert and oriented x3. Cranial nerves: There is good facial symmetry with mild facial hypomimia. The speech is fluent and clear. Soft palate rises symmetrically and there is no tongue deviation. Hearing is intact to conversational tone. Sensation: Sensation is intact to light touch throughout Motor: Strength is at least antigravity x4.  Movement examination: Tone: There is mild to mod increased tone in the LUE and nl in the LUE Abnormal movements: there is RUE>LUE tremor and LLE tremor Coordination:  There is no decremation with RAM's, with any form of RAMS, including alternating supination and pronation of the forearm, hand opening and closing, finger taps, heel taps and toe taps. Gait and Station: The patient has no difficulty arising out of a deep-seated chair without the use of the hands. The patient's stride length is good.    I have reviewed and interpreted the following labs independently    Chemistry      Component Value Date/Time   NA 138 05/17/2019 0319   NA 141 05/12/2019 1518   K 3.8 05/17/2019 0319   CL 103 05/17/2019 0319   CO2 24 05/17/2019 0319   BUN 15 05/17/2019 0319   BUN 14 05/12/2019 1518   CREATININE 0.98 05/17/2019 0319   CREATININE 0.91 01/31/2016 1136      Component Value Date/Time   CALCIUM 9.5 05/17/2019 0319   ALKPHOS 74 04/15/2018 1805   AST 23 04/15/2018 1805   ALT 8 (L) 04/15/2018 1805   BILITOT 0.9 04/15/2018 1805   BILITOT 0.6 04/08/2018 1052        Lab Results  Component Value Date   WBC 11.7 (H) 05/17/2019   HGB 14.1 05/17/2019   HCT 40.5 05/17/2019   MCV 91.2 05/17/2019   PLT 228 05/17/2019    Lab Results  Component Value Date   TSH 1.80 08/30/2019     Total time spent on today's visit was 40 minutes, including both face-to-face time and nonface-to-face time.  Time included that spent on review of records (prior notes available to me/labs/imaging if pertinent), discussing treatment and goals, answering patient's questions and coordinating care.  Cc:  Zenia Resides, MD

## 2020-12-18 ENCOUNTER — Encounter: Payer: Self-pay | Admitting: Neurology

## 2020-12-18 ENCOUNTER — Ambulatory Visit: Payer: PPO | Admitting: Neurology

## 2020-12-18 ENCOUNTER — Other Ambulatory Visit: Payer: Self-pay

## 2020-12-18 VITALS — BP 120/66 | HR 66 | Ht 72.0 in | Wt 210.0 lb

## 2020-12-18 DIAGNOSIS — G4709 Other insomnia: Secondary | ICD-10-CM | POA: Diagnosis not present

## 2020-12-18 DIAGNOSIS — G20 Parkinson's disease: Secondary | ICD-10-CM

## 2020-12-18 MED ORDER — PRAMIPEXOLE DIHYDROCHLORIDE 0.5 MG PO TABS: ORAL_TABLET | ORAL | 1 refills | Status: DC

## 2020-12-18 MED ORDER — MIRTAZAPINE 15 MG PO TBDP: 15.0000 mg | ORAL_TABLET | Freq: Every day | ORAL | 1 refills | Status: DC

## 2020-12-18 NOTE — Patient Instructions (Signed)
1.  Increase pramipexole, 0.5 mg, 2 in the AM, 2 in the afternoon and 1 in the evening  2.  Continue carbidopa/levodopa 25/100, 2 tablets three times per day  3.  Start mirtazapine, 15 mg at bedtime

## 2021-03-06 ENCOUNTER — Telehealth: Payer: Self-pay | Admitting: Medical

## 2021-03-06 DIAGNOSIS — I10 Essential (primary) hypertension: Secondary | ICD-10-CM

## 2021-03-06 MED ORDER — LISINOPRIL 40 MG PO TABS: 40.0000 mg | ORAL_TABLET | Freq: Every day | ORAL | 3 refills | Status: DC

## 2021-03-06 MED ORDER — AMLODIPINE BESYLATE 5 MG PO TABS: 5.0000 mg | ORAL_TABLET | Freq: Every day | ORAL | 3 refills | Status: DC

## 2021-03-06 NOTE — Telephone Encounter (Signed)
Pt c/o swelling: STAT is pt has developed SOB within 24 hours  1) How much weight have you gained and in what time span? 10 lbs in 3 months   2) If swelling, where is the swelling located? Left foot   3) Are you currently taking a fluid pill? Doesn't think so   4) Are you currently SOB? No   5) Do you have a log of your daily weights (if so, list)? No   6) Have you gained 3 pounds in a day or 5 pounds in a week? No   7) Have you traveled recently? No   Swelling comes and goes when he rest and elevates his foot it swelling goes down. Appt has been scheduled for 03/21/21 with Roby Lofts in regards to this.

## 2021-03-06 NOTE — Telephone Encounter (Signed)
Spoke to pt's wife who state for the past month pt has been experiencing swelling in his left foot. She report swelling is usually relieved with elevation but is a continuous cycle. She state pt denies sob and no swelling in any other area.    Will forward to MD to make aware.

## 2021-03-06 NOTE — Telephone Encounter (Signed)
Please have him cut the amlodipine in half (to 5 mg daily) and double the lisinopril (to 40 mg daily). Keep legs elevated as much as possible. Keep the scheduled appt, please

## 2021-03-06 NOTE — Telephone Encounter (Signed)
Wife updated with MD's recommendations and verbalized understanding. New order placed.

## 2021-03-21 ENCOUNTER — Other Ambulatory Visit: Payer: Self-pay

## 2021-03-21 ENCOUNTER — Ambulatory Visit: Payer: PPO | Admitting: Medical

## 2021-03-21 ENCOUNTER — Encounter: Payer: Self-pay | Admitting: Medical

## 2021-03-21 VITALS — BP 120/62 | HR 56 | Ht 72.0 in | Wt 206.8 lb

## 2021-03-21 DIAGNOSIS — M25472 Effusion, left ankle: Secondary | ICD-10-CM | POA: Diagnosis not present

## 2021-03-21 DIAGNOSIS — I10 Essential (primary) hypertension: Secondary | ICD-10-CM | POA: Diagnosis not present

## 2021-03-21 DIAGNOSIS — E785 Hyperlipidemia, unspecified: Secondary | ICD-10-CM | POA: Diagnosis not present

## 2021-03-21 DIAGNOSIS — I25118 Atherosclerotic heart disease of native coronary artery with other forms of angina pectoris: Secondary | ICD-10-CM | POA: Diagnosis not present

## 2021-03-21 NOTE — Progress Notes (Signed)
Cardiology Office Note   Date:  03/27/2021   ID:  Christopher Weeks, DOB 1951-08-21, MRN [id number]  PCP:  Moses Manners, MD  Cardiologist:  Thurmon Fair, MD EP: None  Chief Complaint  Patient presents with  . Left foot swelling      History of Present Illness: Christopher Weeks is a 70 y.o. male with a PMH of CAD s/p PCI/DES to RCA in 2013 and PTCA to ramus intermedius with failed stent placement in 2020, HTN, HLD, and parkinsons disease who presents for evaluation of left foot swelling.  He was last evaluated by cardiology via a telemedicine visit with Edd Fabian, NP 11/26/2020, at which time he was doing well from a cardiac standpoint with resolution of previously described left-sided chest pain with increased activity, notably when mowing his yard, which as relieved with rest, for which his imdur was increased. His Echo 10/2020 showed EF 70-75%, mild LVH, normal LV diastolic function, mild LAE, and no significant valvular abnormalities. His last ischemic evaluation was a LHC in 2020 which showed 3 vessel CAD with patent mRCA stent, 95% ostial small AV groove circumflex too small for PCI, and 80% prox ramus intermedius managed with PTCA, however unable to pass stent with improvement in stenosis to 30%.   He presents today for the evaluation of left foot swelling. He contacted our office 03/06/21 to report swelling in his left foot which generally improved with elevation, however continued to occur regularly. He was recommended to decrease his amlodipine to 5mg  daily and increase lisinopril to 40mg  daily.   He presents today for follow up of his LE edema. He notes resolution of his ankle swelling since adjusting his medications as above. Otherwise he heas been doing well from a cardiac standpoint. Continues to have DOE when mowing the lawn which is improved with rest and has not progressed. He denies chest pain, palpitations, dizziness, lightheadedness, or syncope. He has had some  bruising to his abdomen which he attributes to the lawn mower handle. No complaints of hematuria, hematochezia, or melena.    Past Medical History:  Diagnosis Date  . Anxiety   . Coronary artery disease involving native coronary artery 2013   DES PCI (3x30 2 mm Promus) mid RCA; July 2020 PTCA only of proximal Ramus Intermedius  . Depression   . Dysrhythmia   . GERD (gastroesophageal reflux disease)   . Heart murmur   . History of colon polyps   . History of kidney stones   . Hypertension   . Kidney stones   . Myocardial infarction (HCC)   . Parkinson disease (HCC)   . Parkinson's disease [facility])     Past Surgical History:  Procedure Laterality Date  . anal Personnel officer    . CARDIAC CATHETERIZATION  07/11/2012   high graded stenoses small ift circ and small 1st diag   which were  left for medicl therapy, lv systoilc fx preserved  . CORONARY ANGIOPLASTY  07/11/2012   90% and 70% lesions RCA  3.0 x32 mm Promus DES  . CORONARY BALLOON ANGIOPLASTY N/A 05/16/2019   Procedure: CORONARY BALLOON ANGIOPLASTY;  Surgeon: Marykay Lex, MD;  Location: [facility] INVASIVE CV LAB;  Service: Cardiovascular;  Laterality: N/A;  . CORONARY STENT PLACEMENT  07/11/12  . EXTRACORPOREAL SHOCK WAVE LITHOTRIPSY Left 04/22/2018   Procedure: LEFT EXTRACORPOREAL SHOCK WAVE LITHOTRIPSY (ESWL);  Surgeon: Malen Gauze, MD;  Location: [facility] ORS;  Service: Urology;  Laterality: Left;  . EXTRACORPOREAL SHOCK WAVE  LITHOTRIPSY Left 05/31/2018   Procedure: LEFT EXTRACORPOREAL SHOCK WAVE LITHOTRIPSY (ESWL);  Surgeon: Alexis Frock, MD;  Location: [facility] ORS;  Service: Urology;  Laterality: Left;  [id number] [employer] [id number]  . LEFT HEART CATH N/A 07/11/2012   Procedure: LEFT HEART CATH;  Surgeon: Sanda Klein, MD;  Location: [place] CATH LAB;  Service: Cardiovascular;  Laterality: N/A;  . LEFT HEART CATH AND CORONARY ANGIOGRAPHY N/A 05/16/2019   Procedure: LEFT HEART CATH AND CORONARY ANGIOGRAPHY;  Surgeon: Leonie Man, MD;  Location: [place] CV LAB;  Service: Cardiovascular;  Laterality: N/A;  . PERCUTANEOUS CORONARY STENT INTERVENTION (PCI-S) Right 07/11/2012   Procedure: PERCUTANEOUS CORONARY STENT INTERVENTION (PCI-S);  Surgeon: Sanda Klein, MD;  Location: [facility] CATH LAB;  Service: Cardiovascular;  Laterality: Right;     Current Outpatient Medications  Medication Sig Dispense Refill  . amLODipine (NORVASC) 5 MG tablet Take 1 tablet (5 mg total) by mouth daily. 90 tablet 3  . aspirin 81 MG tablet Take 81 mg by mouth daily.    Marland Kitchen atorvastatin (LIPITOR) 80 MG tablet TAKE (1) TABLET BY MOUTH AT BEDTIME. 90 tablet 3  . Calcium-Vitamin D 600-125 MG-UNIT TABS Take 1 tablet by mouth daily.     . carbidopa-levodopa (SINEMET IR) 25-100 MG tablet TAKE 2 TABLETS BY MOUTH THREE TIMES DAILY. 180 tablet 12  . citalopram (CELEXA) 40 MG tablet TAKE ONE TABLET BY MOUTH DAILY. 90 tablet 3  . clopidogrel (PLAVIX) 75 MG tablet TAKE 1 TABLET BY MOUTH ONCE A DAY. 90 tablet 3  . esomeprazole (NEXIUM) 20 MG capsule TAKE 1 CAPSULE BY MOUTH EVERY MORNING. 90 capsule 3  . ezetimibe (ZETIA) 10 MG tablet TAKE 1 TABLET BY MOUTH ONCE A DAY. 90 tablet 3  . famotidine (PEPCID) 20 MG tablet TAKE 1 TABLET BY MOUTH 2 TIMES DAILY. 180 tablet 3  . isosorbide mononitrate (IMDUR) 120 MG 24 hr tablet Take 1 tablet (120 mg total) by mouth daily. 30 tablet 11  . lisinopril (ZESTRIL) 40 MG tablet Take 1 tablet (40 mg total) by mouth daily. 90 tablet 3  . mirtazapine (REMERON SOL-TAB) 15 MG disintegrating tablet Take 1 tablet (15 mg total) by mouth at bedtime. 90 tablet 1  . pramipexole (MIRAPEX) 0.5 MG tablet 2 in the AM, 2 in the afternoon and 1 in the evening 450 tablet 1  . nitroGLYCERIN (NITROSTAT) 0.4 MG SL tablet Place 1 tablet (0.4 mg total) under the tongue every 5 (five) minutes as needed for chest pain. 25 tablet 3   Current Facility-Administered Medications  Medication Dose Route Frequency Provider Last Rate Last Admin  .  sodium chloride flush (NS) 0.9 % injection 3 mL  3 mL Intravenous Q12H Croitoru, Mihai, MD        Allergies:   Codeine    Social History:  The patient  reports that he quit smoking about 24 years ago. His smoking use included cigarettes. He has a 20.00 pack-year smoking history. He has never used smokeless tobacco. He reports current drug use. Drug: Marijuana. He reports that he does not drink alcohol.   Family History:  The patient's family history includes Cancer in his father and mother; Heart attack in his father. He was adopted.    ROS:  Please see the history of present illness.   Otherwise, review of systems are positive for none.   All other systems are reviewed and negative.    PHYSICAL EXAM: VS:  BP 120/62 (BP Location: Left Arm, Patient Position: Sitting)  Pulse (!) 56   Ht 6' (1.829 m)   Wt 206 lb 12.8 oz (93.8 kg)   SpO2 96%   BMI 28.05 kg/m  , BMI Body mass index is 28.05 kg/m. GEN: Well nourished, well developed, in no acute distress HEENT: scler anicteric Neck: no JVD, carotid bruits, or masses Cardiac: RRR; no murmurs, rubs, or gallops, no edema  Respiratory:  clear to auscultation bilaterally, normal work of breathing GI: soft, nontender, nondistended, + BS MS: no deformity or atrophy Skin: warm and dry, no rash, scattered bruising Neuro:  Strength and sensation are intact; tremor most noticeable in left hand Psych: euthymic mood, full affect   EKG:  EKG is not ordered today.    Recent Labs: 03/26/2021: BUN 20; Creatinine, Ser 1.11; Potassium 5.0; Sodium 141    Lipid Panel    Component Value Date/Time   CHOL 132 03/26/2021 1001   TRIG 79 03/26/2021 1001   HDL 38 (L) 03/26/2021 1001   CHOLHDL 3.5 03/26/2021 1001   CHOLHDL 4.7 02/12/2016 1000   VLDL 34 (H) 02/12/2016 1000   LDLCALC 78 03/26/2021 1001      Wt Readings from Last 3 Encounters:  03/21/21 206 lb 12.8 oz (93.8 kg)  12/18/20 210 lb (95.3 kg)  11/26/20 209 lb (94.8 kg)      Other  studies Reviewed: Additional studies/ records that were reviewed today include:   Echocardiogram 10/2020: 1. Left ventricular ejection fraction, by estimation, is 70 to 75%. The  left ventricle has hyperdynamic function. The left ventricle has no  regional wall motion abnormalities. There is mild left ventricular  hypertrophy. Left ventricular diastolic  parameters were normal.  2. Right ventricular systolic function is normal. The right ventricular  size is normal.  3. Left atrial size was mildly dilated.  4. The mitral valve is normal in structure. No evidence of mitral valve  regurgitation. No evidence of mitral stenosis.  5. The aortic valve has an indeterminant number of cusps. Aortic valve  regurgitation is not visualized. No aortic stenosis is present.  sinus bradycardia with premature atrial complexes possible lateral infarct undetermined age 58 bpm- No acute changes  EKG 09/07/2019 Normal sinus rhythm no ST or T wave deviation, normal QTC just under 450 ms  Cardiac catheterization 05/16/2019  TARGET LESION Ramus-2 lesion is 80% stenosed.  PTCA: Balloon angioplasty was performed on the 80% stenosis using a BALLOON SAPPHIRE 2.0X15. Unable to pass stent. Post intervention, there is a 30% residual stenosis.  Ost Cx to Prox Cx lesion is 99% stenosed -stable, plan for medical management  Previously placed Prox RCA to Mid RCA stent (unknown type) is widely patent.  -------------------------------------------------------------  The left ventricular systolic function is normal. The left ventricular ejection fraction is 55-65% by visual estimate.  LV end diastolic pressure is normal.  SUMMARY  Three-vessel CAD: Widely patent mid RCA stent, 95% ostial small AV groove Circumflex (too small for PCI), 80% proximal Ramus Intermedius  Unsuccessful PCI, PTCA only of proximal Ramus Intermedius lesion reducing from 80% to 30%.  Normal LVEF with normal LVEDP but severely  elevated systemic pressures.  RECOMMENDATIONS  Because of significant comorbidities, the patient we monitored overnight for post PCI evaluation. TR band removal per protocol  Run ACT for 2 hours post PCI and then stop  Anticipate discharge tomorrow if stable -> would likely consider Ranexa if an option given his Parkinson's.  Diagnostic Dominance: Right  Intervention     .    ASSESSMENT AND PLAN:  1. LE edema: resolved with reduction in amlodipine.  - Continue to monitor for now  2. CAD s/p PCI/DES to RCA in 2013 and PTCA to ramus intermedius 2020: no anginal complaints. Not on BBlocker due to bradycardia - Continue aspirin, plavix, and statin - Continue amlodipine and imdur  3. HTN: BP 120/62 today - Continue amlodipine, lisinopril, and imdur - Will check BMET to monitor electrolytes and kidney function closely - he will come back next week to minimize blood draws  4. HLD: LDL 73 in 2020; goal <70 - Will repeat FLP - he will come back next week at his convenience - Continue atorvastatin and zetia   Current medicines are reviewed at length with the patient today.  The patient does not have concerns regarding medicines.  The following changes have been made:  As above  Labs/ tests ordered today include:   Orders Placed This Encounter  Procedures  . Lipid panel  . Basic metabolic panel     Disposition:   FU with Dr. Sallyanne Kuster in 6 months  Signed, Abigail Butts, PA-C  03/27/2021 8:01 AM

## 2021-03-21 NOTE — Patient Instructions (Addendum)
Medication Instructions:  Your physician recommends that you continue on your current medications as directed. Please refer to the Current Medication list given to you today.  *If you need a refill on your cardiac medications before your next appointment, please call your pharmacy*  Lab Work: Your physician recommends that you return for lab work NEXT WEEK AT [place] :   BMET  Fasting Lipid Panel-DO NOT EAT OR DRINK PAST MIDNIGHT. OKAY TO HAVE WATER.  If you have labs (blood work) drawn today and your tests are completely normal, you will receive your results only by: Marland Kitchen MyChart Message (if you have MyChart) OR . A paper copy in the mail If you have any lab test that is abnormal or we need to change your treatment, we will call you to review the results.  Testing/Procedures: NONE ordered at this time of appointment   Follow-Up: At [facility], you and your health needs are our priority.  As part of our continuing mission to provide you with exceptional heart care, we have created designated Provider Care Teams.  These Care Teams include your primary Cardiologist (physician) and Advanced Practice Providers (APPs -  Physician Assistants and Nurse Practitioners) who all work together to provide you with the care you need, when you need it.   Your next appointment:   6 month(s)  The format for your next appointment:   In Person  Provider:   Sanda Klein, MD  Other Instructions

## 2021-03-26 DIAGNOSIS — E785 Hyperlipidemia, unspecified: Secondary | ICD-10-CM | POA: Diagnosis not present

## 2021-03-26 DIAGNOSIS — I10 Essential (primary) hypertension: Secondary | ICD-10-CM | POA: Diagnosis not present

## 2021-03-26 DIAGNOSIS — I25118 Atherosclerotic heart disease of native coronary artery with other forms of angina pectoris: Secondary | ICD-10-CM | POA: Diagnosis not present

## 2021-03-26 LAB — LIPID PANEL
Chol/HDL Ratio: 3.5 ratio (ref 0.0–5.0)
Cholesterol, Total: 132 mg/dL (ref 100–199)
HDL: 38 mg/dL — ABNORMAL LOW (ref >39)
LDL Chol Calc (NIH): 78 mg/dL (ref 0–99)
Triglycerides: 79 mg/dL (ref 0–149)
VLDL Cholesterol Cal: 16 mg/dL (ref 5–40)

## 2021-03-26 LAB — BASIC METABOLIC PANEL
BUN/Creatinine Ratio: 18 (ref 10–24)
BUN: 20 mg/dL (ref 8–27)
CO2: 22 mmol/L (ref 20–29)
Calcium: 10 mg/dL (ref 8.6–10.2)
Chloride: 103 mmol/L (ref 96–106)
Creatinine, Ser: 1.11 mg/dL (ref 0.76–1.27)
Glucose: 142 mg/dL — ABNORMAL HIGH (ref 65–99)
Potassium: 5 mmol/L (ref 3.5–5.2)
Sodium: 141 mmol/L (ref 134–144)
eGFR: 72 mL/min/{1.73_m2} (ref >59)

## 2021-03-27 ENCOUNTER — Other Ambulatory Visit: Payer: Self-pay | Admitting: *Deleted

## 2021-03-27 ENCOUNTER — Encounter: Payer: Self-pay | Admitting: Medical

## 2021-03-27 DIAGNOSIS — I25118 Atherosclerotic heart disease of native coronary artery with other forms of angina pectoris: Secondary | ICD-10-CM

## 2021-03-27 DIAGNOSIS — E785 Hyperlipidemia, unspecified: Secondary | ICD-10-CM

## 2021-06-06 NOTE — Progress Notes (Signed)
Assessment/Plan:   1.  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  Change time of day to 7am/11am/4pm.  Have discussed previously  -continue Pramipexole, 0.5 mg, 2/2/1 (7am/11am/4pm)  -not interested in dbs.  Told him could entertain idea in future if wants.  Is only thing that will help tremor.  2.  Insomnia  -was given mirtazapine but doesn't think that took  -on melatonin  -discussed potential interaction with celexa.  QT/QTc nl 12/8//21  -avoid napping so late and no longer than 1 hour.   Subjective:   Christopher Weeks was seen today in follow up for Parkinsons disease.  My previous records were reviewed prior to todays visit as well as outside records available to me. Pt denies falls.  Pt denies lightheadedness, near syncope.  No hallucinations.  Mood has been good with celexa.  Last visit, I increased his pramipexole.  He has had no side effects.  No compulsive behaviors.  No sleep attacks.  He reports that he is doing about the same.  We did talk about DBS last visit.  He wanted to talk further with his wife.  Hes not interested in that.  We did start mirtazapine last visit for insomnia, but I also told him he needed to decrease his nap in the day.  Its not on his list now.  Hes not sure if he took it or not.    Current prescribed movement disorder medications: Carbidopa/levodopa 25/100, 2 tablets 3 times per day (pharmacy having him dose at 9AM/3pm and bed) Pramipexole 0.5 mg, 2/2/1 (increased from 1 tablet 3 times per day last visit but pharmacy having him dose at 9am/3pm/9pm)) Mirtazapine, 15 mg at bedtime (its not on his list from pharmacy) Stephens: Sinemet and Mirapex  ALLERGIES:   Allergies  Allergen Reactions   Codeine Hives    Denies Airway involvement    CURRENT MEDICATIONS:  Outpatient Encounter Medications as of 06/07/2021  Medication Sig   amLODipine  (NORVASC) 5 MG tablet Take 1 tablet (5 mg total) by mouth daily.   aspirin 81 MG tablet Take 81 mg by mouth daily.   atorvastatin (LIPITOR) 80 MG tablet TAKE (1) TABLET BY MOUTH AT BEDTIME.   Calcium-Vitamin D 600-125 MG-UNIT TABS Take 1 tablet by mouth daily.    carbidopa-levodopa (SINEMET IR) 25-100 MG tablet TAKE 2 TABLETS BY MOUTH THREE TIMES DAILY.   citalopram (CELEXA) 40 MG tablet TAKE ONE TABLET BY MOUTH DAILY.   clopidogrel (PLAVIX) 75 MG tablet TAKE 1 TABLET BY MOUTH ONCE A DAY.   esomeprazole (NEXIUM) 20 MG capsule TAKE 1 CAPSULE BY MOUTH EVERY MORNING.   ezetimibe (ZETIA) 10 MG tablet TAKE 1 TABLET BY MOUTH ONCE A DAY.   famotidine (PEPCID) 20 MG tablet TAKE 1 TABLET BY MOUTH 2 TIMES DAILY.   isosorbide mononitrate (IMDUR) 120 MG 24 hr tablet Take 1 tablet (120 mg total) by mouth daily.   lisinopril (ZESTRIL) 40 MG tablet Take 1 tablet (40 mg total) by mouth daily.   mirtazapine (REMERON SOL-TAB) 15 MG disintegrating tablet Take 1 tablet (15 mg total) by mouth at bedtime.   nitroGLYCERIN (NITROSTAT) 0.4 MG SL tablet Place 1 tablet (0.4 mg total) under the tongue every 5 (five) minutes as needed for chest pain.   pramipexole (MIRAPEX) 0.5 MG tablet 2 in the AM, 2 in the afternoon and 1 in the evening   Facility-Administered Encounter Medications as of  06/07/2021  Medication   sodium chloride flush (NS) 0.9 % injection 3 mL    Objective:   PHYSICAL EXAMINATION:    VITALS:   Vitals:   06/07/21 1244  BP: 110/60  Pulse: (!) 58  SpO2: 96%  Weight: 205 lb 6.4 oz (93.2 kg)  Height: 6' (1.829 m)     GEN:  The patient appears stated age and is in NAD. HEENT:  Normocephalic, atraumatic.  The mucous membranes are moist. The superficial temporal arteries are without ropiness or tenderness. CV:  RRR Lungs:  CTAB Neck/HEME:  There are no carotid bruits bilaterally.  Neurological examination:  Orientation: The patient is alert and oriented x3. Cranial nerves: There is good  facial symmetry with mild facial hypomimia. The speech is fluent and clear. Soft palate rises symmetrically and there is no tongue deviation. Hearing is intact to conversational tone. Sensation: Sensation is intact to light touch throughout Motor: Strength is at least antigravity x4.  Movement examination: Tone: There is normal tone today Abnormal movements: there is bilateral UE rest tremor and LLE rest tremor, mod Coordination:  There is no decremation with RAM's, with any form of RAMS, including alternating supination and pronation of the forearm, hand opening and closing, finger taps, heel taps and toe taps. Gait and Station: The patient has no difficulty arising out of a deep-seated chair without the use of the hands. The patient's stride length is good.    I have reviewed and interpreted the following labs independently    Chemistry      Component Value Date/Time   NA 141 03/26/2021 1001   K 5.0 03/26/2021 1001   CL 103 03/26/2021 1001   CO2 22 03/26/2021 1001   BUN 20 03/26/2021 1001   CREATININE 1.11 03/26/2021 1001   CREATININE 0.91 01/31/2016 1136      Component Value Date/Time   CALCIUM 10.0 03/26/2021 1001   ALKPHOS 74 04/15/2018 1805   AST 23 04/15/2018 1805   ALT 8 (L) 04/15/2018 1805   BILITOT 0.9 04/15/2018 1805   BILITOT 0.6 04/08/2018 1052       Lab Results  Component Value Date   WBC 11.7 (H) 05/17/2019   HGB 14.1 05/17/2019   HCT 40.5 05/17/2019   MCV 91.2 05/17/2019   PLT 228 05/17/2019    Lab Results  Component Value Date   TSH 1.80 08/30/2019     Total time spent on today's visit was 20 minutes, including both face-to-face time and nonface-to-face time.  Time included that spent on review of records (prior notes available to me/labs/imaging if pertinent), discussing treatment and goals, answering patient's questions and coordinating care.  Cc:  Zenia Resides, MD

## 2021-06-07 ENCOUNTER — Encounter: Payer: Self-pay | Admitting: Neurology

## 2021-06-07 ENCOUNTER — Ambulatory Visit (INDEPENDENT_AMBULATORY_CARE_PROVIDER_SITE_OTHER): Payer: PPO | Admitting: Neurology

## 2021-06-07 ENCOUNTER — Other Ambulatory Visit: Payer: Self-pay

## 2021-06-07 VITALS — BP 110/60 | HR 58 | Ht 72.0 in | Wt 205.4 lb

## 2021-06-07 DIAGNOSIS — G20 Parkinson's disease: Secondary | ICD-10-CM | POA: Diagnosis not present

## 2021-06-07 MED ORDER — CARBIDOPA-LEVODOPA 25-100 MG PO TABS: ORAL_TABLET | ORAL | 1 refills | Status: DC

## 2021-06-07 MED ORDER — PRAMIPEXOLE DIHYDROCHLORIDE 0.5 MG PO TABS: ORAL_TABLET | ORAL | 1 refills | Status: DC

## 2021-06-07 NOTE — Patient Instructions (Signed)
Continue carbidopa/levodopa 25/100, 2 tablets 3 times per day.  Change time of day to 7am/11am/4pm Take pramipexole, 0.5 mg, 2 at 7am, 2 at 11am 1 at 4pm avoid napping so late and no longer than 1 hour (no napping after 3pm)   Deep Brain Stimulation  Is it the right choice for me?   What is Deep Brain Stimulation (DBS) Surgery?  DBS is a surgical procedure used to treat symptoms of Parkinson's disease (PD). It involves the implantation of an electrode into the brain (one on each side). The area of the brain that is typically targeted in PD is the subthalamic nucleus.   How does DBS work?  PD is caused by the degeneration of brain cells in a specific part of the brain which make a chemical (neurotransmitter) called dopamine. As time goes by, more and more cells degenerate and the level of dopamine in the brain declines. As a result of this dopamine deficiency, there is a certain circuit in the brain which becomes abnormally overactive. Many symptoms of PD are due to this abnormal, overactive circuit. With DBS, high frequency electrical stimulation is used to disrupt this circuit, thereby blocking the symptoms of PD that were previously being mediated through that circuit. The three main symptoms of PD are shaking (tremor), slowness of movement (bradykinesia), and stiffness (rigidity). All of these symptoms are mediated through this small circuit. Therefore, DBS is very effective in blocking these symptoms. It is important to remember, however, that DBS does not "cure" PD, but rather is a very effective method of treating the symptoms of the disease.   What is actually done during the operation?  The surgical procedure involves the implantation of 2 electrodes (one on each side of the brain). The electrodes are connected to 2 wires, which are then connected to a generator- pacemaker like device (either one or two) in the chest. The generator (and wires) are placed under the skin similar to a cardiac  pacemaker, thus the device itself is not visible. The implanted hardware does, however, produce a lump on the chest where the generator is placed and two small bumps on the scalp where underneath there are small plastic caps which are screwed into the skull and secure the electrode.   What symptoms can I expect DBS to treat?  DBS treats many, but not all symptoms of PD. As mentioned above, tremor, stiffness (rigidity), and slowness of movement (bradykinesia) all respond well to DBS therapy. In addition, many patients with advanced PD have problems with what we call "motor fluctuations". This refers to the wearing off of medication before the next dose associated with breakthrough of PD symptoms, and at other times the effects of excess medication, such as involuntary wiggling (dyskinesia). Because the electrical stimulation is constant, the effect is continuous. Therefore motor fluctuations can be significantly reduced. Furthermore, after DBS most patients are able to significantly reduce the amount of Parkinson's medications they were previously taking. Therefore, side effects of these medications can be significantly reduced as well, and often completely eliminated. Common anti-Parkinson medication side effects include: involuntary wiggling (dyskinesia), visual distortions and hallucinations, nausea and vomiting, and lightheadedness.   What symptoms are not treated with DBS?  Some symptoms of PD are mediated through other brain circuits. Therefore, those symptoms would not be expected to improve with DBS. These symptoms include: soft and mumbled speech (hypophonia), balance trouble, and memory deficits. Even if the DBS surgery is done perfectly, the patient will still have PD. Therefore, because DBS  does not block all of the effected brain circuits, the above mentioned symptoms will likely continue to progress and worsen with time.   How functional can I expect to be following DBS surgery?  Most patients  who are good candidates improve with DBS. Think about how functional you are now, when your medicines are "kicked in" and working at their very best. After DBS we can often get you to that point and keep you there, without all of the fluctuations and the medication side effects. Some patients with PD have bad tremor that does not respond well to medication. DBS can work well to control tremor even when medication cannot.   What are the risks of surgery?  Because the surgery involves introducing a foreign object into the brain, there are inherent risks that are present. First, there is a very small risk, approximately 1%, of having bleeding into the brain causing symptoms similar to that of a stroke. Secondly, there is a 5-7% chance of having an infection related to the procedure. If the device gets infected, then the treatment usually requires that the infected hardware be removed temporarily while antibiotics are given. After the infection is resolved, then the hardware needs to be re-implanted. This would not leave the patient with permanent problems, but it is easy to understand how disappointed someone might be if they have to go through the surgery again. Typical symptoms of infection include redness, swelling, or pain around the device on the skin. There is theoretically a very small chance (much less than 1%) that an infection could spread to the brain. This, of course, would be much more serious. Another small risk of brain surgery is possible seizure (2-3%). A seizure produces transient sudden loss of consciousness and generalized shaking (convulsion). This can be caused by irritation of the brain during the operation. If a seizure occurs, it is almost always at the time of operation. It may require temporarily being treated with seizure medications, but this is typically only short term.   How much trouble is it to get DBS?  Unfortunately, undergoing DBS surgery is a process involving multiple steps.  Even prior to surgery, there are several steps that must be done. The surgery itself takes place in three separate parts. About a week prior to insertion of the electrodes, you will be seen in an ambulatory surgery center to put in markers into the skull, called fiducials. This allows [place] to plan the surgery and to better localize the area in which we will operate. One week later, stage 1 of the procedure will be done in which the electrodes are implanted. Approximately one week after this, stage 2 of the surgery will be done in which the generator (battery) is inserted. Stage 1 of the surgery usually takes several hours. This is when the electrode is placed. This surgery has to be done while the patient is awake. Local anesthesia is used, so the procedure is not generally very painful, but obviously it is a little scary to be operated on while you are awake. Furthermore, patients need to be off of their anti-Parkinson medication during the operation, so that we can more easily identify the abnormal circuit in the brain. It is unpleasant being off anti-Parkinson medication, even for this short time. Approximately 6 weeks after the electrode placement, programming of the device will take place at our first in office clinic visit. This allows [place] to alter the type of stimulation and optimize the beneficial effects. This is done over  several clinic visits. The second post op clinic visit is usually just a week after the first, but subsequent visits will be less frequent. Eventually you shouldn't need to be seen more than once every 4-6 months or so. Overall, you should expect several programming visits before you see the full benefit from DBS surgery.   How long does the hardware last?  The generator runs on a battery inside the device. How long the battery lasts depends on the manufacturer of the device and whether you choose are rechargeable device or a traditional battery.  A traditional battery may last 3 to 5  years and a rechargeable device may last 15-20 years. The battery replacement operation, however, is much easier than the initial elaborate operation. It is typically done as an outpatient procedure.   Does DBS always work?  The key to success is exact placement of the electrode. As you can imagine, the brain has many circuits which are closely packed together. If the electrode is close to being in the right position, but not quite, then there may be a partial response rather than a complete response. If this happens, we may have to turn up the stimulation to try and more completely block the circuit. If we do this, however, we may effect other adjacent circuits that we are not intending to effect, and thereby produce stimulation-related side effects such as slurred speech or facial muscle pulling. These side effects can be easily eliminated by reducing the strength of the stimulation, but then some of the Parkinson symptoms may break through.   Is DBS the right choice for you?  As you can now see, there are many things that carefully need to be considered when making this decision. DBS is not appropriate for all patients with PD. The ultimate decision is yours to make. It is our job to provide you with all the pros and cons, so that you can make the choice that is right for you.  Logistical Details: Pre-Operative Visits:  1) "On-Off" Testing. This visit takes place in the clinic with Dr. Carles Collet several weeks prior to surgery to help determine if you are a good DBS candidate. You will come to the clinic having NOT TAKEN your PD medications. A series of physical examination tests will be done. Then you will be given a dose of carbidopa/levodopa dissolved in ginger ale. Approximately 30 minutes later you will be re-examined with the same tests to see how you respond.   IT IS EXTREMELY IMPORTANT NOT TO TAKE YOUR PARKINSON MEDICATIONS ON THE DAY OF THIS CLINIC VISIT.   2) Neuropsychological Testing. This is  standard testing in all potential candidates to help determine those patients that may be at risk for developing worsening cognition from the procedure. This is a long clinic visit (multiple hours).  3) Pre-Operative MRI. If you are deemed to be a good surgical candidate based on the above 2 visits, you will need to have MRI imaging done. It is very important that we get high quality study. You must have someone accompany you to this visit as we may have to give you sedation in order to make sure the MRI images are of adequate quality.  What to expect regarding surgery:  1. The first step involves placement of the fiducial (reference) pins. This is done the week before surgery by Dr. Vertell Limber. You are given 4 local injections of anesthetic (numbing medication). Next, 4 pins are screwed into the skull. Following the placement of the pins,  you will be transferred down to have a head CT scan. The CT is used in planning for the surgery.  2. Surgery typically takes place one week later. You will have been off all of your Parkinson medications. This can be quite uncomfortable! 3. You will have the sense of "hurry up and wait" multiple times throughout the day, but it is extremely important to remain as patient as possible. It is during these times that we are busy "behind the scenes" doing the surgical planning. 4. In the pre-op area, you will meet with the nurses and anesthesia staff. You may have a catheter placed into the bladder. Once you are taken back to the OR suite, you will be placed in a "beach chair" position. You will not be under general anesthesia. We need you to be awake during certain parts to allow [place] to do important testing. The actual surgical procedure is not generally painful. It is done with local anesthetic agents. However, the procedure can take many hours, and it is expected that you'll become uncomfortable. We try to minimize any sedating medications, but can give you something if needed.  5.  You will have a bad haircut, but it will grow back!  6. Following the surgery, you will stay overnight in te hospital for observation.  7. The following day, you will have either an MRI brain or a CT brain to allow [place] to evaluate the placement of the electrodes and also to make sure there were no bleeding complications. Provided there are no complications, you will be discharged home the day after surgery.   It is extremely important to remember that after having DBS surgery, you will need to notify your physicians before you have an MRI.  Most DBS electrodes ARE now MRI compatible but the DBS must be placed in a special mode before the MRI is completed for your safety.  8. About 1 week later you will return for an outpatient surgery that lasts 1-2 hours during which the generator(s) will be placed. You will go home on the same day as the surgery. You will find that you are more uncomfortable after this surgery than your first surgery. You will be given medications to help with this. The pain from this surgery usually resolves in 2 or 3 days.

## 2021-06-27 ENCOUNTER — Other Ambulatory Visit: Payer: Self-pay | Admitting: Family Medicine

## 2021-06-28 ENCOUNTER — Ambulatory Visit (INDEPENDENT_AMBULATORY_CARE_PROVIDER_SITE_OTHER): Payer: PPO | Admitting: Cardiovascular Disease

## 2021-06-28 ENCOUNTER — Encounter: Payer: Self-pay | Admitting: Cardiovascular Disease

## 2021-06-28 ENCOUNTER — Other Ambulatory Visit: Payer: Self-pay

## 2021-06-28 VITALS — BP 110/72 | HR 51 | Resp 18 | Ht 72.0 in | Wt 205.8 lb

## 2021-06-28 DIAGNOSIS — R7303 Prediabetes: Secondary | ICD-10-CM | POA: Diagnosis not present

## 2021-06-28 DIAGNOSIS — I10 Essential (primary) hypertension: Secondary | ICD-10-CM

## 2021-06-28 DIAGNOSIS — I25118 Atherosclerotic heart disease of native coronary artery with other forms of angina pectoris: Secondary | ICD-10-CM | POA: Diagnosis not present

## 2021-06-28 DIAGNOSIS — E785 Hyperlipidemia, unspecified: Secondary | ICD-10-CM

## 2021-06-28 NOTE — Progress Notes (Signed)
Cardiology office note     Date:  06/28/2021   ID:  Christopher Weeks, DOB 02-26-1951, MRN [id number]   PCP:  Zenia Resides, MD  Cardiologist:  Shaquayla Klimas Electrophysiologist:  None   Evaluation Performed:  Follow-Up Visit  Chief Complaint:  CAD  History of Present Illness:    Christopher Weeks is a 70 y.o. male with coronary artery disease (acute inferior MI, drug-eluting 3.0 x32 mm Promus stent to RCA in 2013; high-grade stenoses in small caliber left circumflex and first diagonal arteries, left for medical therapy), hyperlipidemia, hypertension, prediabetes, Parkinson's disease.  Recently he has not had any cardiovascular complaints.  Activity remains limited by significant movement disorders related to Parkinson's disease.  He has a lot of problems with tremor.  He is also had issues with orthostatic dizziness, but has not had full-blown syncope or injuries or falls.  He had a couple of coronary stenoses involving the ostial left circumflex and the ramus intermedius artery that could not be fixed with angioplasty, but on the current antianginal regimen (amlodipine plus long-acting nitrates and high-dose) his symptoms are well controlled, CCS functional class I-2.  We are not using beta-blockers due to symptomatic bradycardia.  His most recent lipid profile showed an LDL cholesterol close to target at 78.  His HDL remains low at 38.  Hemoglobin A1c has not been recently rechecked.   Past Medical History:  Diagnosis Date   Anxiety    Coronary artery disease involving native coronary artery 2013   DES PCI (3x30 2 mm Promus) mid RCA; July 2020 PTCA only of proximal Ramus Intermedius   Depression    Dysrhythmia    GERD (gastroesophageal reflux disease)    Heart murmur    History of colon polyps    History of kidney stones    Hypertension    Kidney stones    Myocardial infarction ([place])    Parkinson disease ([place])    Parkinson's disease ([place])    Past Surgical History:   Procedure Laterality Date   anal fisher repair     CARDIAC CATHETERIZATION  07/11/2012   high graded stenoses small ift circ and small 1st diag   which were  left for medicl therapy, lv systoilc fx preserved   CORONARY ANGIOPLASTY  07/11/2012   90% and 70% lesions RCA  3.0 x32 mm Promus DES   CORONARY BALLOON ANGIOPLASTY N/A 05/16/2019   Procedure: CORONARY BALLOON ANGIOPLASTY;  Surgeon: Leonie Man, MD;  Location: [facility];  Service: Cardiovascular;  Laterality: N/A;   CORONARY STENT PLACEMENT  07/11/12   EXTRACORPOREAL SHOCK WAVE LITHOTRIPSY Left 04/22/2018   Procedure: LEFT EXTRACORPOREAL SHOCK WAVE LITHOTRIPSY (ESWL);  Surgeon: Cleon Gustin, MD;  Location: [facility];  Service: Urology;  Laterality: Left;   EXTRACORPOREAL SHOCK WAVE LITHOTRIPSY Left 05/31/2018   Procedure: LEFT EXTRACORPOREAL SHOCK WAVE LITHOTRIPSY (ESWL);  Surgeon: Alexis Frock, MD;  Location: [facility];  Service: Urology;  Laterality: Left;  [phone] Jed Limerick [id number]   LEFT HEART CATH N/A 07/11/2012   Procedure: LEFT HEART CATH;  Surgeon: Sanda Klein, MD;  Location: [facility];  Service: Cardiovascular;  Laterality: N/A;   LEFT HEART CATH AND CORONARY ANGIOGRAPHY N/A 05/16/2019   Procedure: LEFT HEART CATH AND CORONARY ANGIOGRAPHY;  Surgeon: Leonie Man, MD;  Location: [facility];  Service: Cardiovascular;  Laterality: N/A;   PERCUTANEOUS CORONARY STENT INTERVENTION (PCI-S) Right 07/11/2012   Procedure: PERCUTANEOUS CORONARY STENT INTERVENTION (PCI-S);  Surgeon: Sanda Klein, MD;  Location: [facility];  Service: Cardiovascular;  Laterality: Right;     Current Meds  Medication Sig   aspirin 81 MG tablet Take 81 mg by mouth daily.   atorvastatin (LIPITOR) 80 MG tablet TAKE (1) TABLET BY MOUTH AT BEDTIME.   Calcium-Vitamin D 600-125 MG-UNIT TABS Take 1 tablet by mouth daily.    carbidopa-levodopa (SINEMET IR) 25-100 MG tablet 2 tablets at 7am/11am/4pm   citalopram (CELEXA) 40  MG tablet TAKE ONE TABLET BY MOUTH DAILY.   clopidogrel (PLAVIX) 75 MG tablet TAKE 1 TABLET BY MOUTH ONCE A DAY.   esomeprazole (NEXIUM) 20 MG capsule TAKE 1 CAPSULE BY MOUTH EVERY MORNING.   ezetimibe (ZETIA) 10 MG tablet TAKE 1 TABLET BY MOUTH ONCE A DAY.   isosorbide mononitrate (IMDUR) 120 MG 24 hr tablet Take 1 tablet (120 mg total) by mouth daily.   mirtazapine (REMERON SOL-TAB) 15 MG disintegrating tablet Take 1 tablet (15 mg total) by mouth at bedtime.   pramipexole (MIRAPEX) 0.5 MG tablet 2 at 7 AM, 2 at 11am and 1 at 4pm   Current Facility-Administered Medications for the 06/28/21 encounter (Office Visit) with Veer Elamin, Dani Gobble, MD  Medication   sodium chloride flush (NS) 0.9 % injection 3 mL     Allergies:   Codeine   Social History   Tobacco Use   Smoking status: Former    Packs/day: 1.00    Years: 20.00    Pack years: 20.00    Types: Cigarettes    Quit date: 11/10/1996    Years since quitting: 24.6   Smokeless tobacco: Never  Vaping Use   Vaping Use: Never used  Substance Use Topics   Alcohol use: No    Alcohol/week: 0.0 standard drinks   Drug use: Yes    Types: Marijuana    Comment: not using, uses CBD oil now- 04/21/18     Family Hx: The patient's family history includes Cancer in his father and mother; Heart attack in his father. He was adopted.  ROS:   Please see the history of present illness.     All other systems are reviewed and are negative  Prior CV studies:   The following studies were reviewed today: Angiography/PCI images May 16, 2019  Labs/Other Tests and Data Reviewed:    EKG: Ordered today and personally reviewed shows sinus bradycardia at 51 bpm and incomplete right bundle branch block.  He has old sharp Q waves in lead I and lead aVL. QT 456 ms  Recent Labs: 03/26/2021: BUN 20; Creatinine, Ser 1.11; Potassium 5.0; Sodium 141   Recent Lipid Panel Lab Results  Component Value Date/Time   CHOL 132 03/26/2021 10:01 AM   TRIG 79 03/26/2021  10:01 AM   HDL 38 (L) 03/26/2021 10:01 AM   CHOLHDL 3.5 03/26/2021 10:01 AM   CHOLHDL 4.7 02/12/2016 10:00 AM   LDLCALC 78 03/26/2021 10:01 AM    Wt Readings from Last 3 Encounters:  06/28/21 205 lb 12.8 oz (93.4 kg)  06/07/21 205 lb 6.4 oz (93.2 kg)  03/21/21 206 lb 12.8 oz (93.8 kg)     Objective:    Vital Signs:  BP 110/72   Pulse (!) 51   Resp 18   Ht 6' (1.829 m)   Wt 205 lb 12.8 oz (93.4 kg)   SpO2 98%   BMI 27.91 kg/m     General: Alert, oriented x3, no distress, overweight Head: no evidence of trauma, PERRL, EOMI, no exophtalmos or lid lag, no myxedema, no  xanthelasma; normal ears, nose and oropharynx Neck: normal jugular venous pulsations and no hepatojugular reflux; brisk carotid pulses without delay and no carotid bruits Chest: clear to auscultation, no signs of consolidation by percussion or palpation, normal fremitus, symmetrical and full respiratory excursions Cardiovascular: normal position and quality of the apical impulse, regular rhythm, normal first and second heart sounds, no murmurs, rubs or gallops Abdomen: no tenderness or distention, no masses by palpation, no abnormal pulsatility or arterial bruits, normal bowel sounds, no hepatosplenomegaly Extremities: no clubbing, cyanosis or edema; 2+ radial, ulnar and brachial pulses bilaterally; 2+ right femoral, posterior tibial and dorsalis pedis pulses; 2+ left femoral, posterior tibial and dorsalis pedis pulses; no subclavian or femoral bruits Neurological: grossly nonfocal except for moderately severe resting and intentional tremor. Psych: Normal mood and affect   ASSESSMENT & PLAN:    CAD: CCS functional class I-II on combination calcium channel blocker plus long-acting nitrates.  Beta-blocker intolerant due to bradycardia.  Barring any bleeding problems, plan to continue long-term aspirin plus clopidogrel due to the extent of his CAD. HTN: Well-controlled.  He does have some symptoms of orthostatic  hypotension. HLP: He is on maximum dose atorvastatin and also takes ezetimibe.  LDL is not fully at target but acceptable. PreDM: No recent hemoglobin A1c.  Most recent glucose level was [id number], but I not certain this was a fasting specimen.  Recommend checking hemoglobin A1c.  He is now only mildly overweight.  Patient Instructions  Medication Instructions:  No changes *If you need a refill on your cardiac medications before your next appointment, please call your pharmacy*   Lab Work: None ordered If you have labs (blood work) drawn today and your tests are completely normal, you will receive your results only by: [place] (if you have MyChart) OR A paper copy in the mail If you have any lab test that is abnormal or we need to change your treatment, we will call you to review the results.   Testing/Procedures: None ordered   Follow-Up: At [facility], you and your health needs are our priority.  As part of our continuing mission to provide you with exceptional heart care, we have created designated Provider Care Teams.  These Care Teams include your primary Cardiologist (physician) and Advanced Practice Providers (APPs -  Physician Assistants and Nurse Practitioners) who all work together to provide you with the care you need, when you need it.  We recommend signing up for the patient portal called "MyChart".  Sign up information is provided on this After Visit Summary.  MyChart is used to connect with patients for Virtual Visits (Telemedicine).  Patients are able to view lab/test results, encounter notes, upcoming appointments, etc.  Non-urgent messages can be sent to your provider as well.   To learn more about what you can do with MyChart, go to [email or web address].    Your next appointment:   12 month(s)  The format for your next appointment:   In Person  Provider:   You may see Sanda Klein, MD or one of the following Advanced Practice Providers on your  designated Care Team:   Almyra Deforest, PA-C Fabian Sharp, [place] or  Roby Lofts, PA-C    Signed, Sanda Klein, MD  06/28/2021 11:40 AM    [place]

## 2021-06-28 NOTE — Patient Instructions (Signed)

## 2021-07-09 ENCOUNTER — Other Ambulatory Visit: Payer: Self-pay | Admitting: Family Medicine

## 2021-09-26 ENCOUNTER — Other Ambulatory Visit: Payer: Self-pay | Admitting: General Practice

## 2021-10-28 ENCOUNTER — Other Ambulatory Visit: Payer: Self-pay | Admitting: General Practice

## 2021-11-25 ENCOUNTER — Other Ambulatory Visit: Payer: Self-pay | Admitting: General Practice

## 2021-11-25 ENCOUNTER — Other Ambulatory Visit: Payer: Self-pay | Admitting: Family Medicine

## 2021-11-25 ENCOUNTER — Other Ambulatory Visit: Payer: Self-pay | Admitting: Cardiovascular Disease

## 2021-11-25 DIAGNOSIS — F321 Major depressive disorder, single episode, moderate: Secondary | ICD-10-CM

## 2021-12-13 NOTE — Progress Notes (Signed)
Assessment/Plan:   1.  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    -continue Pramipexole, 0.5 mg, 2/2/1 (7am/11am/4pm)  -discussed again the concept of levodopa resistant tremor.  He won't get better tremor wise without dbs/surgical intervention.  He is rightfully nervous about surgery, but also is not sure that he wants to live with this degree of tremor.  He is interested in talking to somebody who has had DBS as well as talking with the patient care representative.  We will try to schedule him for this and then I will see him back again.  We reviewed the risks of DBS surgery.  Besides for the risks inherent with DBS, he would also need cardiac clearance.   2.  Insomnia  -on melatonin  -avoid napping so late and no longer than 1 hour.  -on mirtazapine  3.  Elbow pain after fall  -f/u pcp   Subjective:   Christopher Weeks was seen today in follow up for Parkinsons disease.  My previous records were reviewed prior to todays visit as well as outside records available to me. Pt had a fall 2 weeks ago and fell and hurt elbow.  Hasn't seen anyone about that yet.  Hurts to bend the elbow  Pt denies lightheadedness, near syncope.  No hallucinations.  Mood has been good with celexa.  Sleeping well with mirtazapine.  No compulsive behaviors or sleep attacks.  Discussed with patient last visit about proper timing of levodopa.  Reports that he is doing better with that.  Last visit told me not taking mirtazapine but states that he is now.  Current prescribed movement disorder medications: Carbidopa/levodopa 25/100, 2 tablets 3 times per day  Pramipexole 0.5 mg, 2/2/1  mirtazapine Celexa  PREVIOUS MEDICATIONS: Sinemet and Mirapex;  ALLERGIES:   Allergies  Allergen Reactions   Codeine Hives    Denies Airway involvement    CURRENT MEDICATIONS:  Outpatient Encounter Medications as of 12/18/2021  Medication  Sig   aspirin 81 MG tablet Take 81 mg by mouth daily.   atorvastatin (LIPITOR) 80 MG tablet TAKE (1) TABLET BY MOUTH AT BEDTIME.   Calcium-Vitamin D 600-125 MG-UNIT TABS Take 1 tablet by mouth daily.    carbidopa-levodopa (SINEMET IR) 25-100 MG tablet 2 tablets at 7am/11am/4pm   citalopram (CELEXA) 40 MG tablet TAKE ONE TABLET BY MOUTH DAILY.   clopidogrel (PLAVIX) 75 MG tablet TAKE 1 TABLET BY MOUTH ONCE A DAY.   esomeprazole (NEXIUM) 20 MG capsule TAKE 1 CAPSULE BY MOUTH EVERY MORNING.   ezetimibe (ZETIA) 10 MG tablet TAKE 1 TABLET BY MOUTH ONCE A DAY.   isosorbide mononitrate (IMDUR) 120 MG 24 hr tablet TAKE ONE TABLET BY MOUTH ONCE DAILY.   mirtazapine (REMERON SOL-TAB) 15 MG disintegrating tablet Take 1 tablet (15 mg total) by mouth at bedtime.   nitroGLYCERIN (NITROSTAT) 0.4 MG SL tablet DISSOLVE 1 TABLET SUBLINGUALLY AS NEEDED FOR CHEST PAIN, MAY REPEAT EVERY 5 MINUTES. AFTER 3 CALL 911.   pramipexole (MIRAPEX) 0.5 MG tablet 2 at 7 AM, 2 at 11am and 1 at 4pm   amLODipine (NORVASC) 5 MG tablet Take 1 tablet (5 mg total) by mouth daily.   lisinopril (ZESTRIL) 40 MG tablet Take 1 tablet (40 mg total) by mouth daily.   Facility-Administered Encounter Medications as of 12/18/2021  Medication   sodium chloride flush (NS) 0.9 % injection 3 mL    Objective:   PHYSICAL EXAMINATION:  VITALS:   Vitals:   12/18/21 0946  BP: 126/82  Pulse: (!) 56  SpO2: 97%  Weight: 223 lb 9.6 oz (101.4 kg)  Height: 6' (1.829 m)      GEN:  The patient appears stated age and is in NAD. HEENT:  Normocephalic, atraumatic.  The mucous membranes are moist.  Neurological examination:  Orientation: The patient is alert and oriented x3. Cranial nerves: There is good facial symmetry with mild facial hypomimia. The speech is fluent and clear. Soft palate rises symmetrically and there is no tongue deviation. Hearing is intact to conversational tone. Sensation: Sensation is intact to light touch  throughout Motor: Strength is at least antigravity x4.  Movement examination: Tone: There is mild to mod increased tone in the RUE/LLE Abnormal movements: there is bilateral UE rest tremor and LLE rest tremor, mod Coordination:  There is no decremation with RAM's, with any form of RAMS, including alternating supination and pronation of the forearm, hand opening and closing, finger taps, heel taps and toe taps. Gait and Station: The patient has no difficulty arising out of a deep-seated chair without the use of the hands. The patient's stride length is good.    I have reviewed and interpreted the following labs independently    Chemistry      Component Value Date/Time   NA 141 03/26/2021 1001   K 5.0 03/26/2021 1001   CL 103 03/26/2021 1001   CO2 22 03/26/2021 1001   BUN 20 03/26/2021 1001   CREATININE 1.11 03/26/2021 1001   CREATININE 0.91 01/31/2016 1136      Component Value Date/Time   CALCIUM 10.0 03/26/2021 1001   ALKPHOS 74 04/15/2018 1805   AST 23 04/15/2018 1805   ALT 8 (L) 04/15/2018 1805   BILITOT 0.9 04/15/2018 1805   BILITOT 0.6 04/08/2018 1052       Lab Results  Component Value Date   WBC 11.7 (H) 05/17/2019   HGB 14.1 05/17/2019   HCT 40.5 05/17/2019   MCV 91.2 05/17/2019   PLT 228 05/17/2019    Lab Results  Component Value Date   TSH 1.80 08/30/2019     Total time spent on today's visit was 30 minutes, including both face-to-face time and nonface-to-face time.  Time included that spent on review of records (prior notes available to me/labs/imaging if pertinent), discussing treatment and goals, answering patient's questions and coordinating care.  Cc:  Zenia Resides, MD

## 2021-12-18 ENCOUNTER — Other Ambulatory Visit: Payer: Self-pay

## 2021-12-18 ENCOUNTER — Encounter: Payer: Self-pay | Admitting: Neurology

## 2021-12-18 ENCOUNTER — Ambulatory Visit: Payer: PPO | Admitting: Neurology

## 2021-12-18 VITALS — BP 126/82 | HR 56 | Ht 72.0 in | Wt 223.6 lb

## 2021-12-18 DIAGNOSIS — G20 Parkinson's disease: Secondary | ICD-10-CM

## 2021-12-23 ENCOUNTER — Other Ambulatory Visit: Payer: Self-pay | Admitting: Neurology

## 2021-12-23 ENCOUNTER — Other Ambulatory Visit: Payer: Self-pay | Admitting: General Practice

## 2021-12-23 ENCOUNTER — Other Ambulatory Visit: Payer: Self-pay | Admitting: Cardiovascular Disease

## 2021-12-23 ENCOUNTER — Other Ambulatory Visit: Payer: Self-pay | Admitting: Family Medicine

## 2021-12-23 DIAGNOSIS — G20 Parkinson's disease: Secondary | ICD-10-CM

## 2022-01-21 ENCOUNTER — Other Ambulatory Visit: Payer: Self-pay | Admitting: Cardiovascular Disease

## 2022-01-21 ENCOUNTER — Other Ambulatory Visit: Payer: Self-pay | Admitting: General Practice

## 2022-02-24 ENCOUNTER — Other Ambulatory Visit: Payer: Self-pay | Admitting: Cardiovascular Disease

## 2022-03-10 ENCOUNTER — Ambulatory Visit: Payer: PPO | Admitting: Neurology

## 2022-03-11 NOTE — Progress Notes (Signed)
? ? ?Assessment/Plan:  ? ?1.  Parkinsons Disease with levodopa resistant resistant tremor ? -Continue carbidopa/levodopa 25/100, 2 tablets 3 times per day.   ? -continue Pramipexole, 0.5 mg, 2/2/1 (7am/11am/4pm) ? -I talked to the patient about the logistics associated with DBS therapy.  I talked to the patient about risks/benefits/side effects of DBS therapy.  We talked about risks which included but were not limited to infection, paralysis, intraoperative seizure, death, stroke, bleeding around the electrode.   I talked to patient about fiducial placement 1 week prior to DBS therapy.  I talked to the patient about what to expect in the operating room, including the fact that this is an awake surgery.  We talked about battery placement as well as which is done under general anesthesia, generally approximately one week following the initial surgery.  We also talked about the fact that the patient will need to be off of medications for surgery.  The patient and family were given the opportunity to ask questions, which they did, and I answered them to the best of my ability today.  Patient and son asked many questions and answered those to the best of my ability today.  Patient and son both expressed desire to proceed with potential DBS.  The next step is neurocognitive testing and levodopa challenge.  Spoke to Dr. Melvyn Novas about the patient.  We will get that scheduled. ? -Asked patient to make an appointment with cardiology to make sure that he can get cardiac clearance for surgery.  He has had a heart attack in the past.  Need to make sure that he is a surgical candidate. ? ? ? ?2.  Insomnia ? -on melatonin ? -avoid napping so late and no longer than 1 hour. ? -on mirtazapine ? ? ? ? ?Subjective:  ? ?Christopher Weeks was seen today in follow up for Parkinsons disease.  My previous records were reviewed prior to todays visit as well as outside records available to me.  Pt with son who supplements the history.  Since  our last visit, the patient did meet with a Public librarian, as well as a patient who had DBS surgery.  Patient reports that he would like to move forward with consideration of dbs.  He cannot live with this degree of tremor.   ? ?Current prescribed movement disorder medications: ?Carbidopa/levodopa 25/100, 2 tablets 3 times per day  ?Pramipexole 0.5 mg, 2/2/1  ?mirtazapine ?Celexa ? ?PREVIOUS MEDICATIONS: Sinemet and Mirapex; ? ?ALLERGIES:   ?Allergies  ?Allergen Reactions  ? Codeine Hives  ?  Denies Airway involvement  ? ? ?CURRENT MEDICATIONS:  ?Outpatient Encounter Medications as of 03/13/2022  ?Medication Sig  ? amLODipine (NORVASC) 5 MG tablet TAKE ONE TABLET BY MOUTH ONCE DAILY.  ? aspirin 81 MG tablet Take 81 mg by mouth daily.  ? atorvastatin (LIPITOR) 80 MG tablet TAKE (1) TABLET BY MOUTH AT BEDTIME.  ? Calcium-Vitamin D 600-125 MG-UNIT TABS Take 1 tablet by mouth daily.   ? carbidopa-levodopa (SINEMET IR) 25-100 MG tablet TAKE 2 TABLETS BY MOUTH THREE TIMES DAILY.  ? citalopram (CELEXA) 40 MG tablet TAKE ONE TABLET BY MOUTH DAILY.  ? clopidogrel (PLAVIX) 75 MG tablet TAKE 1 TABLET BY MOUTH ONCE A DAY.  ? esomeprazole (NEXIUM) 20 MG capsule TAKE 1 CAPSULE BY MOUTH EVERY MORNING.  ? ezetimibe (ZETIA) 10 MG tablet TAKE 1 TABLET BY MOUTH ONCE A DAY.  ? isosorbide mononitrate (IMDUR) 120 MG 24 hr tablet TAKE ONE TABLET BY MOUTH  ONCE DAILY.  ? lisinopril (ZESTRIL) 40 MG tablet TAKE ONE TABLET BY MOUTH ONCE DAILY.  ? mirtazapine (REMERON SOL-TAB) 15 MG disintegrating tablet Take 1 tablet (15 mg total) by mouth at bedtime.  ? nitroGLYCERIN (NITROSTAT) 0.4 MG SL tablet DISSOLVE 1 TABLET SUBLINGUALLY AS NEEDED FOR CHEST PAIN, MAY REPEAT EVERY 5 MINUTES. AFTER 3 CALL 911.  ? pramipexole (MIRAPEX) 0.5 MG tablet TAKE (2) TABLETS IN THE MORNING TAKE (2) TABLETS IN THE AFTERNOON THEN TAKE (1) TABLET IN THE EVENING.  ? ?Facility-Administered Encounter Medications as of 03/13/2022  ?Medication  ? sodium  chloride flush (NS) 0.9 % injection 3 mL  ? ? ?Objective:  ? ?PHYSICAL EXAMINATION:   ? ?VITALS:   ?Vitals:  ? 03/13/22 0847  ?BP: (!) 141/82  ?Pulse: 62  ?SpO2: 96%  ?Weight: 227 lb 6.4 oz (103.1 kg)  ? ? ? ? ? ?GEN:  The patient appears stated age and is in NAD. ?HEENT:  Normocephalic, atraumatic.  The mucous membranes are moist. ?Cardiovascular: Regular rate rhythm ?Lungs: Clear to auscultation bilaterally ?Neck: No bruits. ? ?Neurological examination: ? ?Orientation: The patient is alert and oriented x3. ?Cranial nerves: There is good facial symmetry with mild facial hypomimia. The speech is fluent and clear. Soft palate rises symmetrically and there is no tongue deviation. Hearing is intact to conversational tone. ?Sensation: Sensation is intact to light touch throughout ?Motor: Strength is at least antigravity x4. ? ?Movement examination: ?Tone: There is mild to mod increased tone in the RUE/LLE ?Abnormal movements: there is bilateral UE rest tremor and LLE rest tremor, mod ?Coordination:  There is no decremation with RAM's, with any form of RAMS, including alternating supination and pronation of the forearm, hand opening and closing, finger taps, heel taps and toe taps. ?Gait and Station: The patient has no difficulty arising out of a deep-seated chair without the use of the hands. The patient's stride length is good.   ? ?I have reviewed and interpreted the following labs independently ? ?  Chemistry   ?   ?Component Value Date/Time  ? NA 141 03/26/2021 1001  ? K 5.0 03/26/2021 1001  ? CL 103 03/26/2021 1001  ? CO2 22 03/26/2021 1001  ? BUN 20 03/26/2021 1001  ? CREATININE 1.11 03/26/2021 1001  ? CREATININE 0.91 01/31/2016 1136  ?    ?Component Value Date/Time  ? CALCIUM 10.0 03/26/2021 1001  ? ALKPHOS 74 04/15/2018 1805  ? AST 23 04/15/2018 1805  ? ALT 8 (L) 04/15/2018 1805  ? BILITOT 0.9 04/15/2018 1805  ? BILITOT 0.6 04/08/2018 1052  ?  ? ? ? ?Lab Results  ?Component Value Date  ? WBC 11.7 (H) 05/17/2019   ? HGB 14.1 05/17/2019  ? HCT 40.5 05/17/2019  ? MCV 91.2 05/17/2019  ? PLT 228 05/17/2019  ? ? ?Lab Results  ?Component Value Date  ? TSH 1.80 08/30/2019  ? ? ? ?Total time spent on today's visit was 45 minutes, including both face-to-face time and nonface-to-face time.  Time included that spent on review of records (prior notes available to me/labs/imaging if pertinent), discussing treatment and goals, answering patient's questions and coordinating care. ? ?Cc:  Zenia Resides, MD ? ?

## 2022-03-13 ENCOUNTER — Encounter: Payer: Self-pay | Admitting: Neurology

## 2022-03-13 ENCOUNTER — Ambulatory Visit: Payer: PPO | Admitting: Neurology

## 2022-03-13 VITALS — BP 141/82 | HR 62 | Wt 227.4 lb

## 2022-03-13 DIAGNOSIS — G20 Parkinson's disease: Secondary | ICD-10-CM | POA: Diagnosis not present

## 2022-03-13 NOTE — Patient Instructions (Addendum)
?Deep Brain Stimulation  ?Is it the right choice for me?  ? ?What is Deep Brain Stimulation (DBS) Surgery?  ?DBS is a surgical procedure used to treat symptoms of Parkinson?s disease (PD). It involves the implantation of an electrode into the brain (one on each side). The area of the brain that is typically targeted in PD is the subthalamic nucleus.  ? ?How does DBS work?  ?PD is caused by the degeneration of brain cells in a specific part of the brain which make a chemical (neurotransmitter) called dopamine. As time goes by, more and more cells degenerate and the level of dopamine in the brain declines. As a result of this dopamine deficiency, there is a certain circuit in the brain which becomes abnormally overactive. Many symptoms of PD are due to this abnormal, overactive circuit. With DBS, high frequency electrical stimulation is used to disrupt this circuit, thereby blocking the symptoms of PD that were previously being mediated through that circuit. The three main symptoms of PD are shaking (tremor), slowness of movement (bradykinesia), and stiffness (rigidity). All of these symptoms are mediated through this small circuit. Therefore, DBS is very effective in blocking these symptoms. It is important to remember, however, that DBS does not ?cure? PD, but rather is a very effective method of treating the symptoms of the disease.  ? ?What is actually done during the operation?  ?The surgical procedure involves the implantation of 2 electrodes (one on each side of the brain). The electrodes are connected to 2 wires, which are then connected to a generator- pacemaker like device (either one or two) in the chest. The generator (and wires) are placed under the skin similar to a cardiac pacemaker, thus the device itself is not visible. The implanted hardware does, however, produce a lump on the chest where the generator is placed and two small bumps on the scalp where underneath there are small plastic caps which  are screwed into the skull and secure the electrode.  ? ?What symptoms can I expect DBS to treat?  ?DBS treats many, but not all symptoms of PD. As mentioned above, tremor, stiffness (rigidity), and slowness of movement (bradykinesia) all respond well to DBS therapy. In addition, many patients with advanced PD have problems with what we call ?motor fluctuations?. This refers to the wearing off of medication before the next dose associated with breakthrough of PD symptoms, and at other times the effects of excess medication, such as involuntary wiggling (dyskinesia). Because the electrical stimulation is constant, the effect is continuous. Therefore motor fluctuations can be significantly reduced. Furthermore, after DBS most patients are able to significantly reduce the amount of Parkinson?s medications they were previously taking. Therefore, side effects of these medications can be significantly reduced as well, and often completely eliminated. Common anti-Parkinson medication side effects include: involuntary wiggling (dyskinesia), visual distortions and hallucinations, nausea and vomiting, and lightheadedness.  ? ?What symptoms are not treated with DBS?  ?Some symptoms of PD are mediated through other brain circuits. Therefore, those symptoms would not be expected to improve with DBS. These symptoms include: soft and mumbled speech (hypophonia), balance trouble, and memory deficits. Even if the DBS surgery is done perfectly, the patient will still have PD. Therefore, because DBS does not block all of the effected brain circuits, the above mentioned symptoms will likely continue to progress and worsen with time.  ? ?How functional can I expect to be following DBS surgery?  ?Most patients who are good candidates improve with DBS. Think  about how functional you are now, when your medicines are ?kicked in? and working at their very best. After DBS we can often get you to that point and keep you there, without all of  the fluctuations and the medication side effects. Some patients with PD have bad tremor that does not respond well to medication. DBS can work well to control tremor even when medication cannot.  ? ?What are the risks of surgery?  ?Because the surgery involves introducing a foreign object into the brain, there are inherent risks that are present. First, there is a very small risk, approximately 1%, of having bleeding into the brain causing symptoms similar to that of a stroke. Secondly, there is a 5-7% chance of having an infection related to the procedure. If the device gets infected, then the treatment usually requires that the infected hardware be removed temporarily while antibiotics are given. After the infection is resolved, then the hardware needs to be re-implanted. This would not leave the patient with permanent problems, but it is easy to understand how disappointed someone might be if they have to go through the surgery again. Typical symptoms of infection include redness, swelling, or pain around the device on the skin. There is theoretically a very small chance (much less than 1%) that an infection could spread to the brain. This, of course, would be much more serious. Another small risk of brain surgery is possible seizure (2-3%). A seizure produces transient sudden loss of consciousness and generalized shaking (convulsion). This can be caused by irritation of the brain during the operation. If a seizure occurs, it is almost always at the time of operation. It may require temporarily being treated with seizure medications, but this is typically only short term.  ? ?How much trouble is it to get DBS?  ?Unfortunately, undergoing DBS surgery is a process involving multiple steps. Even prior to surgery, there are several steps that must be done. The surgery itself takes place in three separate parts. About a week prior to insertion of the electrodes, you will be seen in an ambulatory surgery center to put  in markers into the skull, called fiducials. This allows [place] to plan the surgery and to better localize the area in which we will operate. One week later, stage 1 of the procedure will be done in which the electrodes are implanted. Approximately one week after this, stage 2 of the surgery will be done in which the generator (battery) is inserted. Stage 1 of the surgery usually takes several hours. This is when the electrode is placed. This surgery has to be done while the patient is awake. Local anesthesia is used, so the procedure is not generally very painful, but obviously it is a little scary to be operated on while you are awake. Furthermore, patients need to be off of their anti-Parkinson medication during the operation, so that we can more easily identify the abnormal circuit in the brain. It is unpleasant being off anti-Parkinson medication, even for this short time. Approximately 6 weeks after the electrode placement, programming of the device will take place at our first in office clinic visit. This allows [place] to alter the type of stimulation and optimize the beneficial effects. This is done over several clinic visits. The second post op clinic visit is usually just a week after the first, but subsequent visits will be less frequent. Eventually you shouldn?t need to be seen more than once every 4-6 months or so. Overall, you should expect several programming  visits before you see the full benefit from DBS surgery. ?  ?How long does the hardware last?  ?The generator runs on a battery inside the device. How long the battery lasts depends on the manufacturer of the device and whether you choose are rechargeable device or a traditional battery.  A traditional battery may last 3 to 5 years and a rechargeable device may last 15-20 years. The battery replacement operation, however, is much easier than the initial elaborate operation. It is typically done as an outpatient procedure.  ? ?Does DBS always work?  ?The  key to success is exact placement of the electrode. As you can imagine, the brain has many circuits which are closely packed together. If the electrode is close to being in the right position, but not qu

## 2022-03-18 ENCOUNTER — Ambulatory Visit: Payer: PPO | Admitting: Neurology

## 2022-03-18 ENCOUNTER — Encounter: Payer: Self-pay | Admitting: Cardiovascular Disease

## 2022-03-18 NOTE — Telephone Encounter (Signed)
Error

## 2022-03-19 ENCOUNTER — Telehealth: Payer: Self-pay | Admitting: Neurology

## 2022-03-19 NOTE — Telephone Encounter (Signed)
Patients wife called to give the name and number of [facility].  It is Dr Sallyanne Kuster and his number is [phone].  She stated he wanted to follow up and touch base with Dr Tat. ?

## 2022-03-20 ENCOUNTER — Ambulatory Visit: Payer: PPO | Admitting: Neurology

## 2022-03-20 NOTE — Telephone Encounter (Signed)
I spoke to his cardiologist this morning.  His cardiologist did not want to touch base with me.  I had previously asked the patient to make an appointment with his cardiologist to see if he would be an appropriate surgical candidate for DBS, since we will be working him up for DBS and a new he had had extensive coronary disease.  The patient has not yet done this.  I explained the procedure to the cardiologist so he would be aware, but the patient needs to actually make an appointment with the cardiologist so that they can assess risk.  Patient would need to be off his aspirin/Plavix for the staged procedure.  Appreciate help from his cardiologist, but ultimately patient needs to make an appointment with his cardiologist as he has not been there since last summer. ?

## 2022-03-20 NOTE — Telephone Encounter (Signed)
Called patients wife they are scheduled with Dr. Loletha Grayer for next week and scheduled with Dr. Melvyn Novas  ?

## 2022-03-27 ENCOUNTER — Encounter: Payer: Self-pay | Admitting: Cardiovascular Disease

## 2022-03-27 ENCOUNTER — Ambulatory Visit: Payer: PPO | Admitting: Cardiovascular Disease

## 2022-03-27 VITALS — BP 130/72 | HR 60 | Ht 72.0 in | Wt 227.6 lb

## 2022-03-27 DIAGNOSIS — I2089 Other forms of angina pectoris: Secondary | ICD-10-CM

## 2022-03-27 DIAGNOSIS — M25471 Effusion, right ankle: Secondary | ICD-10-CM | POA: Diagnosis not present

## 2022-03-27 DIAGNOSIS — M25472 Effusion, left ankle: Secondary | ICD-10-CM

## 2022-03-27 DIAGNOSIS — Z01810 Encounter for preprocedural cardiovascular examination: Secondary | ICD-10-CM | POA: Diagnosis not present

## 2022-03-27 DIAGNOSIS — I208 Other forms of angina pectoris: Secondary | ICD-10-CM

## 2022-03-27 DIAGNOSIS — R7303 Prediabetes: Secondary | ICD-10-CM | POA: Diagnosis not present

## 2022-03-27 DIAGNOSIS — E785 Hyperlipidemia, unspecified: Secondary | ICD-10-CM | POA: Diagnosis not present

## 2022-03-27 DIAGNOSIS — I10 Essential (primary) hypertension: Secondary | ICD-10-CM | POA: Diagnosis not present

## 2022-03-27 NOTE — Patient Instructions (Signed)
Medication Instructions:  No changes *If you need a refill on your cardiac medications before your next appointment, please call your pharmacy*   Lab Work: None ordered If you have labs (blood work) drawn today and your tests are completely normal, you will receive your results only by: [place] (if you have MyChart) OR A paper copy in the mail If you have any lab test that is abnormal or we need to change your treatment, we will call you to review the results.   Testing/Procedures: Your physician has requested that you have a lexiscan myoview. For further information please visit [email or web address]. Please follow instruction sheet, as given.   How to prepare for your Myocardial Perfusion Test: Do not eat or drink 3 hours prior to your test, except you may have water. Do not consume products containing caffeine (regular or decaffeinated) 12 hours prior to your test. (ex: coffee, chocolate, sodas, tea). Do bring a list of your current medications with you.  If not listed below, you may take your medications as normal. Do wear comfortable clothes (no dresses or overalls) and walking shoes, tennis shoes preferred (No heels or open toe shoes are allowed). Do NOT wear cologne, perfume, aftershave, or lotions (deodorant is allowed). The test will take approximately 3 to 4 hours to complete If these instructions are not followed, your test will have to be rescheduled.    Follow-Up: At [facility], you and your health needs are our priority.  As part of our continuing mission to provide you with exceptional heart care, we have created designated Provider Care Teams.  These Care Teams include your primary Cardiologist (physician) and Advanced Practice Providers (APPs -  Physician Assistants and Nurse Practitioners) who all work together to provide you with the care you need, when you need it.  We recommend signing up for the patient portal called "MyChart".  Sign up information is  provided on this After Visit Summary.  MyChart is used to connect with patients for Virtual Visits (Telemedicine).  Patients are able to view lab/test results, encounter notes, upcoming appointments, etc.  Non-urgent messages can be sent to your provider as well.   To learn more about what you can do with MyChart, go to [email or web address].    Your next appointment:   12 month(s)  The format for your next appointment:   In Person  Provider:   Sanda Klein, MD {   Important Information About Sugar

## 2022-03-27 NOTE — Progress Notes (Signed)
Cardiology office note     Date:  03/30/2022   ID:  Christopher Weeks, DOB [birth year], MRN [id number]   PCP:  Zenia Resides, MD  Cardiologist:  Suriah Peragine Electrophysiologist:  None   Evaluation Performed:  Follow-Up Visit  Chief Complaint:  CAD  History of Present Illness:    Christopher Weeks is a 71 y.o. male with coronary artery disease (acute inferior MI, drug-eluting 3.0 x32 mm Promus stent to RCA in 2013; high-grade stenoses in small caliber left circumflex and first diagonal arteries, left for medical therapy), hyperlipidemia, hypertension, prediabetes, Parkinson's disease.  He continues to have CCS functional class II exertional angina.  He notices chest pain when he has to mow a "dog patch" with a push lawnmower, but does not have any trouble during activities of daily living.  He has not required sublingual nitroglycerin.  His angina is described as a sensation of "indigestion" with activity.  He is currently aching amlodipine and a high dose of long-acting nitrates.  We are avoiding beta-blockers due to symptomatic bradycardia.  The biggest impediment to quality of life remains Parkinson's disease with associated movement disorders and occasionally disabling tremor.  He has had some issues with orthostatic hypotension as well, but has not had any falls or full-blown syncope since his last appointment.  Dr. Carles Collet has recommended evaluation for deep brain stimulation.  He is in part here today to discuss the cardiovascular risks of this procedure.  His most recent lipid profile showed an LDL cholesterol close to target at 78.  His HDL remains low at 38.  Hemoglobin A1c has not been recently rechecked.     Past Medical History:  Diagnosis Date   CAD S/P percutaneous coronary angioplasty 07/13/2012   CAD s/p DES RCA (07/2012 - Promus Element 3.0x32) Cath/ PCI to RI 05/15/2019 (unable to place stent). residual CAD of CFX- medical Rx   Coronary artery disease of native artery of  native heart with stable angina pectoris 05/12/2019   DES PCI (3x30 2 mm Promus) mid RCA; July 2020 PTCA only of proximal Ramus Intermedius   Dyslipidemia, goal LDL below 70 01/07/2007   Dysrhythmia    Essential hypertension, benign 03/14/2009   Has both true hypertension and white coat hypertension.  Avoid over treatment based on office readings, which run high.     Gastroesophageal reflux disease without esophagitis 01/24/2015   Heart murmur    Hematuria 12/24/2017   2 episodes of gross hematuria without infection   History of colon polyps    History of kidney stones    Impotence of organic origin 01/07/2007   Major depressive disorder 07/11/2015   Myocardial infarction    Parkinson's disease 01/14/2012   Tremor began Jan/Feb 2013 and seems to be progressive Update 04/13/12  Seen by neuro and diagnosed with Parkinson's Disease.    Pre-diabetes 08/06/2012   A1C with cardiac stent was 6.3   Progressive angina 05/16/2019   Past Surgical History:  Procedure Laterality Date   anal fisher repair     CARDIAC CATHETERIZATION  07/11/2012   high graded stenoses small ift circ and small 1st diag   which were  left for medicl therapy, lv systoilc fx preserved   CORONARY ANGIOPLASTY  07/11/2012   90% and 70% lesions RCA  3.0 x32 mm Promus DES   CORONARY BALLOON ANGIOPLASTY N/A 05/16/2019   Procedure: CORONARY BALLOON ANGIOPLASTY;  Surgeon: Leonie Man, MD;  Location: [facility];  Service: Cardiovascular;  Laterality: N/A;  CORONARY STENT PLACEMENT  07/11/12   EXTRACORPOREAL SHOCK WAVE LITHOTRIPSY Left 04/22/2018   Procedure: LEFT EXTRACORPOREAL SHOCK WAVE LITHOTRIPSY (ESWL);  Surgeon: Cleon Gustin, MD;  Location: [facility];  Service: Urology;  Laterality: Left;   EXTRACORPOREAL SHOCK WAVE LITHOTRIPSY Left 05/31/2018   Procedure: LEFT EXTRACORPOREAL SHOCK WAVE LITHOTRIPSY (ESWL);  Surgeon: Alexis Frock, MD;  Location: [facility];  Service: Urology;  Laterality: Left;   [phone] Jed Limerick [id number]   LEFT HEART CATH N/A 07/11/2012   Procedure: LEFT HEART CATH;  Surgeon: Sanda Klein, MD;  Location: [facility];  Service: Cardiovascular;  Laterality: N/A;   LEFT HEART CATH AND CORONARY ANGIOGRAPHY N/A 05/16/2019   Procedure: LEFT HEART CATH AND CORONARY ANGIOGRAPHY;  Surgeon: Leonie Man, MD;  Location: [facility];  Service: Cardiovascular;  Laterality: N/A;   PERCUTANEOUS CORONARY STENT INTERVENTION (PCI-S) Right 07/11/2012   Procedure: PERCUTANEOUS CORONARY STENT INTERVENTION (PCI-S);  Surgeon: Sanda Klein, MD;  Location: [facility];  Service: Cardiovascular;  Laterality: Right;     Current Meds  Medication Sig   amLODipine (NORVASC) 5 MG tablet TAKE ONE TABLET BY MOUTH ONCE DAILY.   aspirin 81 MG tablet Take 81 mg by mouth daily.   atorvastatin (LIPITOR) 80 MG tablet TAKE (1) TABLET BY MOUTH AT BEDTIME.   Calcium-Vitamin D 600-125 MG-UNIT TABS Take 1 tablet by mouth daily.    carbidopa-levodopa (SINEMET IR) 25-100 MG tablet TAKE 2 TABLETS BY MOUTH THREE TIMES DAILY.   citalopram (CELEXA) 40 MG tablet TAKE ONE TABLET BY MOUTH DAILY.   clopidogrel (PLAVIX) 75 MG tablet TAKE 1 TABLET BY MOUTH ONCE A DAY.   esomeprazole (NEXIUM) 20 MG capsule TAKE 1 CAPSULE BY MOUTH EVERY MORNING.   ezetimibe (ZETIA) 10 MG tablet TAKE 1 TABLET BY MOUTH ONCE A DAY.   isosorbide mononitrate (IMDUR) 120 MG 24 hr tablet TAKE ONE TABLET BY MOUTH ONCE DAILY.   lisinopril (ZESTRIL) 40 MG tablet TAKE ONE TABLET BY MOUTH ONCE DAILY.   mirtazapine (REMERON SOL-TAB) 15 MG disintegrating tablet Take 1 tablet (15 mg total) by mouth at bedtime.   nitroGLYCERIN (NITROSTAT) 0.4 MG SL tablet DISSOLVE 1 TABLET SUBLINGUALLY AS NEEDED FOR CHEST PAIN, MAY REPEAT EVERY 5 MINUTES. AFTER 3 CALL 911.   pramipexole (MIRAPEX) 0.5 MG tablet TAKE (2) TABLETS IN THE MORNING TAKE (2) TABLETS IN THE AFTERNOON THEN TAKE (1) TABLET IN THE EVENING.   Current  Facility-Administered Medications for the 03/27/22 encounter (Office Visit) with Chadrick Sprinkle, Dani Gobble, MD  Medication   sodium chloride flush (NS) 0.9 % injection 3 mL     Allergies:   Codeine   Social History   Tobacco Use   Smoking status: Former    Packs/day: 1.00    Years: 20.00    Pack years: 20.00    Types: Cigarettes    Quit date: 11/10/1996    Years since quitting: 25.4   Smokeless tobacco: Never  Vaping Use   Vaping Use: Never used  Substance Use Topics   Alcohol use: No    Alcohol/week: 0.0 standard drinks   Drug use: Not Currently    Comment: prior MJ use; not using now, uses CBD oil - 04/21/18     Family Hx: The patient's family history includes Cancer in his father and mother; Heart attack in his father. He was adopted.  ROS:   Please see the history of present illness.     All other systems are reviewed and are negative  Prior CV studies:  The following studies were reviewed today: Angiography/PCI images May 16, 2019  Labs/Other Tests and Data Reviewed:    EKG: Ordered today and personally reviewed shows sinus rhythm with old right bundle branch block and old Q waves in leads I and aVL, no acute ischemic repolarization abnormalities, QTc 454 ms. Recent Labs: No results found for requested labs within last 8760 hours.   Recent Lipid Panel Lab Results  Component Value Date/Time   CHOL 132 03/26/2021 10:01 AM   TRIG 79 03/26/2021 10:01 AM   HDL 38 (L) 03/26/2021 10:01 AM   CHOLHDL 3.5 03/26/2021 10:01 AM   CHOLHDL 4.7 02/12/2016 10:00 AM   LDLCALC 78 03/26/2021 10:01 AM    Wt Readings from Last 3 Encounters:  03/27/22 227 lb 9.6 oz (103.2 kg)  03/13/22 227 lb 6.4 oz (103.1 kg)  12/18/21 223 lb 9.6 oz (101.4 kg)     Objective:    Vital Signs:  BP 130/72   Pulse 60   Ht 6' (1.829 m)   Wt 227 lb 9.6 oz (103.2 kg)   SpO2 98%   BMI 30.87 kg/m     General: Alert, oriented x3, no distress, borderline obese Head: no evidence of trauma, PERRL, EOMI,  no exophtalmos or lid lag, no myxedema, no xanthelasma; normal ears, nose and oropharynx Neck: normal jugular venous pulsations and no hepatojugular reflux; brisk carotid pulses without delay and no carotid bruits Chest: clear to auscultation, no signs of consolidation by percussion or palpation, normal fremitus, symmetrical and full respiratory excursions Cardiovascular: normal position and quality of the apical impulse, regular rhythm, normal first and second heart sounds, no murmurs, rubs or gallops Abdomen: no tenderness or distention, no masses by palpation, no abnormal pulsatility or arterial bruits, normal bowel sounds, no hepatosplenomegaly Extremities: no clubbing, cyanosis; symmetrical 1+ ankle edema edema; 2+ radial, ulnar and brachial pulses bilaterally; 2+ right femoral, posterior tibial and dorsalis pedis pulses; 2+ left femoral, posterior tibial and dorsalis pedis pulses; no subclavian or femoral bruits Neurological: grossly nonfocal except for resting tremor most prominent in his right hand Psych: Normal mood and affect    ASSESSMENT & PLAN:    1. Stable angina (HCC)   2. Essential hypertension, benign   3. Dyslipidemia (high LDL; low HDL)   4. Pre-diabetes   5. Preoperative cardiovascular examination   6. Ankle edema, bilateral      CAD: CCS functional class II angina on amlodipine and long-acting nitrates.  Beta-blocker intolerant due to bradycardia.  He would have to discontinue aspirin and clopidogrel for the planned deep brain stimulation procedure.  We will check a Lexiscan Myoview to make sure that the ischemic areas are limited to the small distal territories we know from his last angiogram. HTN: Well-controlled, has some symptoms of orthostatic hypotension but no falls or syncope. HLP: Time to repeat his LDL cholesterol.  He is on maximum dose atorvastatin plus ezetimibe with an LDL that is not quite at target (less than 70).  Chronically low HDL cholesterol.  He has  gained a little weight since his last appointment and his lipid profile may have worsened. PreDM: Most recent hemoglobin A1c was 6.0% but this has not been checked recently. Preop CV risk eval: The deep brain stimulation surgical procedure should have low risk for major cardiovascular complications, but he would have to drop dual antiplatelet therapy for a long period of time.  We will check a Lexiscan Myoview before giving final recommendations. I suspect his ankle swelling is due  to treatment with amlodipine.  We will avoid diuretics due to his risk of orthostatic hypotension.  Patient Instructions  Medication Instructions:  No changes *If you need a refill on your cardiac medications before your next appointment, please call your pharmacy*   Lab Work: None ordered If you have labs (blood work) drawn today and your tests are completely normal, you will receive your results only by: [place] (if you have MyChart) OR A paper copy in the mail If you have any lab test that is abnormal or we need to change your treatment, we will call you to review the results.   Testing/Procedures: Your physician has requested that you have a lexiscan myoview. For further information please visit [email or web address]. Please follow instruction sheet, as given.   How to prepare for your Myocardial Perfusion Test: Do not eat or drink 3 hours prior to your test, except you may have water. Do not consume products containing caffeine (regular or decaffeinated) 12 hours prior to your test. (ex: coffee, chocolate, sodas, tea). Do bring a list of your current medications with you.  If not listed below, you may take your medications as normal. Do wear comfortable clothes (no dresses or overalls) and walking shoes, tennis shoes preferred (No heels or open toe shoes are allowed). Do NOT wear cologne, perfume, aftershave, or lotions (deodorant is allowed). The test will take approximately 3 to 4 hours to  complete If these instructions are not followed, your test will have to be rescheduled.    Follow-Up: At [facility], you and your health needs are our priority.  As part of our continuing mission to provide you with exceptional heart care, we have created designated Provider Care Teams.  These Care Teams include your primary Cardiologist (physician) and Advanced Practice Providers (APPs -  Physician Assistants and Nurse Practitioners) who all work together to provide you with the care you need, when you need it.  We recommend signing up for the patient portal called "MyChart".  Sign up information is provided on this After Visit Summary.  MyChart is used to connect with patients for Virtual Visits (Telemedicine).  Patients are able to view lab/test results, encounter notes, upcoming appointments, etc.  Non-urgent messages can be sent to your provider as well.   To learn more about what you can do with MyChart, go to [email or web address].    Your next appointment:   12 month(s)  The format for your next appointment:   In Person  Provider:   Sanda Klein, MD {   Important Information About Sugar         Signed, Sanda Klein, MD  03/30/2022 7:49 PM    [place]

## 2022-03-28 ENCOUNTER — Ambulatory Visit: Payer: PPO | Admitting: Psychology

## 2022-03-28 ENCOUNTER — Encounter: Payer: Self-pay | Admitting: Psychology

## 2022-03-28 ENCOUNTER — Ambulatory Visit (INDEPENDENT_AMBULATORY_CARE_PROVIDER_SITE_OTHER): Payer: PPO | Admitting: Psychology

## 2022-03-28 DIAGNOSIS — G3184 Mild cognitive impairment, so stated: Secondary | ICD-10-CM | POA: Insufficient documentation

## 2022-03-28 DIAGNOSIS — G20 Parkinson's disease: Secondary | ICD-10-CM | POA: Diagnosis not present

## 2022-03-28 DIAGNOSIS — I219 Acute myocardial infarction, unspecified: Secondary | ICD-10-CM | POA: Insufficient documentation

## 2022-03-28 DIAGNOSIS — R4189 Other symptoms and signs involving cognitive functions and awareness: Secondary | ICD-10-CM

## 2022-03-28 DIAGNOSIS — I499 Cardiac arrhythmia, unspecified: Secondary | ICD-10-CM | POA: Insufficient documentation

## 2022-03-28 HISTORY — DX: Mild cognitive impairment of uncertain or unknown etiology: G31.84

## 2022-03-28 NOTE — Progress Notes (Signed)
   Psychometrician Note   Cognitive testing was administered to [employer] by Milana Kidney, B.S. (psychometrist) under the supervision of Dr. Christia Reading, Ph.D., licensed psychologist on 03/28/2022. Mr. Dicostanzo did not appear overtly distressed by the testing session per behavioral observation or responses across self-report questionnaires. Rest breaks were offered.    The battery of tests administered was selected by Dr. Christia Reading, Ph.D. with consideration to Mr. Zinn current level of functioning, the nature of his symptoms, emotional and behavioral responses during interview, level of literacy, observed level of motivation/effort, and the nature of the referral question. This battery was communicated to the psychometrist. Communication between Dr. Christia Reading, Ph.D. and the psychometrist was ongoing throughout the evaluation and Dr. Christia Reading, Ph.D. was immediately accessible at all times. Dr. Christia Reading, Ph.D. provided supervision to the psychometrist on the date of this service to the extent necessary to assure the quality of all services provided.    Geralyn Corwin will return within approximately 1-2 weeks for an interactive feedback session with Dr. Melvyn Novas at which time his test performances, clinical impressions, and treatment recommendations will be reviewed in detail. Mr. Crate understands he can contact our office should he require our assistance before this time.  A total of 135 minutes of billable time were spent face-to-face with Mr. Encarnacion by the psychometrist. This includes both test administration and scoring time. Billing for these services is reflected in the clinical report generated by Dr. Christia Reading, Ph.D.  This note reflects time spent with the psychometrician and does not include test scores or any clinical interpretations made by Dr. Melvyn Novas. The full report will follow in a separate note.

## 2022-03-28 NOTE — Progress Notes (Signed)
NEUROPSYCHOLOGICAL EVALUATION [place]. [facility] Department of Neurology  Date of Evaluation: Mar 28, 2022  Reason for Referral:   Christopher Weeks is a 71 y.o. right-handed Caucasian male referred by Alonza Bogus, D.O., to characterize his current cognitive functioning and assist with diagnostic clarity and treatment planning in the context of Parkinson's disease and ongoing consideration for DBS surgical intervention.  Assessment and Plan:   Clinical Impression(s): Scores across stand-alone and embedded performance validity measures were variable but more often below expectation. I do not believe that Mr. Spadafore was purposefully performing poorly or exhibiting poor testing engagement. Rather, symptoms of anxiety were very noticeable during the evaluation. This culminated in the middle of memory testing, where he expressed a need to "get up and walk around." He ultimately walked to the waiting room and spent several minutes with his daughter prior to returning to complete additional testing. During an orientation task he responded "to see if I can get [DBS]" as the reason for the current evaluation, suggesting that may have panicked when faced with cognitive difficulties and experienced some fear that he would be disallowed to consider this surgical procedure. Based upon experienced anxiety, results of the current evaluation should be interpreted with some caution and lower scores may underestimate true cognitive abilities.  If taken at face value, Mr. Hingle pattern of performance is suggestive of severe impairment surrounding verbal fluency, as well as all aspects of verbal learning and memory. Receptive language also exhibited normative impairment; however, a functional impairment seemed less likely as he was able to comprehend cognitive tasks and interview questions without difficulty. Performance variability was exhibited across executive functioning and visual learning  and memory (with greater dysfunction surrounding encoding rather than retrieval aspects). Performances were appropriate across processing speed, attention/concentration, confrontation naming, and visuospatial abilities. Mr. Abdallah denied difficulties completing instrumental activities of daily living (ADLs) independently. As such, given evidence for cognitive dysfunction described above, he meets criteria for a Mild Neurocognitive Disorder (formerly "mild cognitive impairment") at the present time.  Important Considerations for DBS Candidacy:  1. Is the patient experiencing cognitive symptoms that far exceed what is expected for their situation? Unclear. As stated above, anxiety was present and very impactful throughout testing. This culminated in Mr. Lipari needing to take a break in the middle of memory testing, suggesting that the timing between learning trials and delayed retrieval tasks was elongated relative to standardized administration. This, coupled with how significant anxiety can affect cognitive functioning in general, may be responsible for some of memory impairment captured during testing. Trouble with executive functioning, verbal fluency, and encoding/retrieval aspects of memory are common in Parkinson's disease. If taken at face value, current verbal memory impairments would be beyond what would typically be expected for this illness.   2. Is there a separate neurological process at work? Unclear. As stated above, if scores are taken at face value, verbal memory impairment is beyond what would be expected in this condition. When considering neurodegenerative brain processes, verbal memory impairment will typically raise concerns for Alzheimer's disease. However, retention rates were variable across verbal measures (i.e., 0% retention to 76% retention) and fully appropriate across a visual memory task. Confrontation naming was also appropriate, which is commonly affected early on in Alzheimer's  disease presentations. Currently, even when taking scores at face value, patterns do not fully align with early concerns for this illness. It should also be said that anxiety during test very likely suppressed scores, making pattern interpretation less accurate  in this case.   He does not display cognitive or behavioral features particularly concerning for Lewy body disease or frontotemporal lobar degeneration at this time. I do not have recent neuroimaging to better examine potential vascular contributions to ongoing impairment.   3. Were any psychological stressors identified within the individual and/or family beyond PD that may impact post-operative adjustment? I do not believe so. He did report acute symptoms of mild anxiety and depression occurring during the past 1-2 weeks. Generally speaking, he did not report a history of severe psychiatric symptoms during interview and it seems more likely to me that psychiatric distress would improve should his tremors become better managed.   4. Can this person cope with the stress of surgery and be compliant as an awake participant in surgery? Somewhat unclear. Him experiencing significant anxiety while being evaluated could certainly translate into significant anxiety while his brain is being operated on and an electrode being placed. However, I do theorize that much of the current anxiety during testing surrounded the outcome (i.e., being cleared for DBS). Should he be cleared and the fear of being not deemed a surgical candidate be eliminated, acute anxiety may become less of an issue.   5. Can this person participate in the multiple post-operative programming sessions and medication adjustments? Yes, I believe so.   6. From a neuropsychological perspective, does this person appear to be a good candidate for DBS? Unfortunately, I cannot make that determination at the present time given the likelihood that acute anxiety affected the validity of obtained test  data.  Recommendations: Repeat testing could be considered in the future. Now that he has gone through the process, there could be the potential for anxiety to be somewhat tempered given first hand knowledge of what to expect.   If testing is taken at face value, it would suggest greater left hemisphere dysfunction, particularly left frontotemporal circuitry, assuming normal brain lateralization in this right-handed individual. I would recommend that he be referred for neuroimaging in the form of a brain MRI to better understand any possible anatomical correlates for ongoing dysfunction. A PET scan could also be considered to look for any hypometabolic patterns which could suggest the presence of a neurological condition outside of Parkinson's disease. If both return without a noted abnormality, the decision to proceed with DBS surgery would be at Dr. Doristine Devoid discretion.   Mr. Lieske is encouraged to attend to lifestyle factors for brain health (e.g., regular physical exercise, good nutrition habits, regular participation in cognitively-stimulating activities, and general stress management techniques), which are likely to have benefits for both emotional adjustment and cognition. In fact, in addition to promoting good general health, regular exercise incorporating aerobic activities (e.g., brisk walking, jogging, cycling, etc.) has been demonstrated to be a very effective treatment for depression and stress, with similar efficacy rates to both antidepressant medication and psychotherapy. Optimal control of vascular risk factors (including safe cardiovascular exercise and adherence to dietary recommendations) is encouraged.   Memory can be improved using internal strategies such as rehearsal, repetition, chunking, mnemonics, association, and imagery. External strategies such as written notes in a consistently used memory journal, visual and nonverbal auditory cues such as a calendar on the refrigerator or  appointments with alarm, such as on a cell phone, can also help maximize recall.    Because he shows better recall for structured information, he will likely understand and retain new information better if it is presented to him in a meaningful or well-organized manner at the  outset, such as grouping items into meaningful categories or presenting information in an outlined, bulleted, or story format. Reducing anxiety may also aid in the retrieval of information.   To address problems with executive dysfunction, he may wish to consider:   -Avoiding external distractions when needing to concentrate   -Limiting exposure to fast paced environments with multiple sensory demands   -Writing down complicated information and using checklists   -Attempting and completing one task at a time (i.e., no multi-tasking)   -Verbalizing aloud each step of a task to maintain focus   -Reducing the amount of information considered at one time  Review of Records:   Mr. Isadore was most recently seen by [facility] Guiles, Tat, D.O.) on 03/13/2022 for follow-up of Parkinson's disease diagnosed around 2013. Mr. Sissel experiences a bilateral upper extremity resting tremor of moderate severity. Gait and mobility appeared adequate and she noted no decreased RAMs. Tremors have remained quite significant despite ongoing levodopa medication intervention [currently carbidopa/levodopa 25/100, 2 tablets 3 times per day; Pramipexole, 0.5 mg, 2/2/1 (7am/11am/4pm)]. Given this, he has expressed a desire to undergo DBS as a potential means of better managing tremors. Ultimately, he was referred for a comprehensive neuropsychological evaluation as a part of the pre-surgical process.   Head CT on 04/10/2014 in the context of a fall and severe headache was negative. Head CT on 05/16/2019 in the context of headaches was also negative. No additional neuroimaging was available for review.   Past Medical History:  Diagnosis Date   CAD S/P  percutaneous coronary angioplasty 07/13/2012   CAD s/p DES RCA (07/2012 - Promus Element 3.0x32) Cath/ PCI to RI 05/15/2019 (unable to place stent). residual CAD of CFX- medical Rx   Coronary artery disease of native artery of native heart with stable angina pectoris 05/12/2019   DES PCI (3x30 2 mm Promus) mid RCA; July 2020 PTCA only of proximal Ramus Intermedius   Dyslipidemia, goal LDL below 70 01/07/2007   Dysrhythmia    Essential hypertension, benign 03/14/2009   Has both true hypertension and white coat hypertension.  Avoid over treatment based on office readings, which run high.     Gastroesophageal reflux disease without esophagitis 01/24/2015   Heart murmur    Hematuria 12/24/2017   2 episodes of gross hematuria without infection   History of colon polyps    History of kidney stones    Impotence of organic origin 01/07/2007   Major depressive disorder 07/11/2015   Myocardial infarction    Parkinson's disease 01/14/2012   Tremor began Jan/Feb 2013 and seems to be progressive Update 04/13/12  Seen by neuro and diagnosed with Parkinson's Disease.    Pre-diabetes 08/06/2012   A1C with cardiac stent was 6.3   Progressive angina 05/16/2019    Past Surgical History:  Procedure Laterality Date   anal fisher repair     CARDIAC CATHETERIZATION  07/11/2012   high graded stenoses small ift circ and small 1st diag   which were  left for medicl therapy, lv systoilc fx preserved   CORONARY ANGIOPLASTY  07/11/2012   90% and 70% lesions RCA  3.0 x32 mm Promus DES   CORONARY BALLOON ANGIOPLASTY N/A 05/16/2019   Procedure: CORONARY BALLOON ANGIOPLASTY;  Surgeon: Leonie Man, MD;  Location: [facility];  Service: Cardiovascular;  Laterality: N/A;   CORONARY STENT PLACEMENT  07/11/12   EXTRACORPOREAL SHOCK WAVE LITHOTRIPSY Left 04/22/2018   Procedure: LEFT EXTRACORPOREAL SHOCK WAVE LITHOTRIPSY (ESWL);  Surgeon: Cleon Gustin,  MD;  Location: [facility];  Service: Urology;  Laterality: Left;    EXTRACORPOREAL SHOCK WAVE LITHOTRIPSY Left 05/31/2018   Procedure: LEFT EXTRACORPOREAL SHOCK WAVE LITHOTRIPSY (ESWL);  Surgeon: Alexis Frock, MD;  Location: [facility];  Service: Urology;  Laterality: Left;  [phone] Jed Limerick [id number]   LEFT HEART CATH N/A 07/11/2012   Procedure: LEFT HEART CATH;  Surgeon: Sanda Klein, MD;  Location: [facility];  Service: Cardiovascular;  Laterality: N/A;   LEFT HEART CATH AND CORONARY ANGIOGRAPHY N/A 05/16/2019   Procedure: LEFT HEART CATH AND CORONARY ANGIOGRAPHY;  Surgeon: Leonie Man, MD;  Location: [place];  Service: Cardiovascular;  Laterality: N/A;   PERCUTANEOUS CORONARY STENT INTERVENTION (PCI-S) Right 07/11/2012   Procedure: PERCUTANEOUS CORONARY STENT INTERVENTION (PCI-S);  Surgeon: Sanda Klein, MD;  Location: [facility];  Service: Cardiovascular;  Laterality: Right;    Current Outpatient Medications:    amLODipine (NORVASC) 5 MG tablet, TAKE ONE TABLET BY MOUTH ONCE DAILY., Disp: 90 tablet, Rfl: 0   aspirin 81 MG tablet, Take 81 mg by mouth daily., Disp: , Rfl:    atorvastatin (LIPITOR) 80 MG tablet, TAKE (1) TABLET BY MOUTH AT BEDTIME., Disp: 90 tablet, Rfl: 3   Calcium-Vitamin D 600-125 MG-UNIT TABS, Take 1 tablet by mouth daily. , Disp: , Rfl:    carbidopa-levodopa (SINEMET IR) 25-100 MG tablet, TAKE 2 TABLETS BY MOUTH THREE TIMES DAILY., Disp: 180 tablet, Rfl: 11   citalopram (CELEXA) 40 MG tablet, TAKE ONE TABLET BY MOUTH DAILY., Disp: 90 tablet, Rfl: 3   clopidogrel (PLAVIX) 75 MG tablet, TAKE 1 TABLET BY MOUTH ONCE A DAY., Disp: 90 tablet, Rfl: 3   esomeprazole (NEXIUM) 20 MG capsule, TAKE 1 CAPSULE BY MOUTH EVERY MORNING., Disp: 90 capsule, Rfl: 3   ezetimibe (ZETIA) 10 MG tablet, TAKE 1 TABLET BY MOUTH ONCE A DAY., Disp: 90 tablet, Rfl: 3   isosorbide mononitrate (IMDUR) 120 MG 24 hr tablet, TAKE ONE TABLET BY MOUTH ONCE DAILY., Disp: 90 tablet, Rfl: 2   lisinopril (ZESTRIL) 40 MG tablet, TAKE ONE TABLET BY  MOUTH ONCE DAILY., Disp: 90 tablet, Rfl: 0   mirtazapine (REMERON SOL-TAB) 15 MG disintegrating tablet, Take 1 tablet (15 mg total) by mouth at bedtime., Disp: 90 tablet, Rfl: 1   nitroGLYCERIN (NITROSTAT) 0.4 MG SL tablet, DISSOLVE 1 TABLET SUBLINGUALLY AS NEEDED FOR CHEST PAIN, MAY REPEAT EVERY 5 MINUTES. AFTER 3 CALL 911., Disp: 25 tablet, Rfl: 0   pramipexole (MIRAPEX) 0.5 MG tablet, TAKE (2) TABLETS IN THE MORNING TAKE (2) TABLETS IN THE AFTERNOON THEN TAKE (1) TABLET IN THE EVENING., Disp: 450 tablet, Rfl: 0  Current Facility-Administered Medications:    sodium chloride flush (NS) 0.9 % injection 3 mL, 3 mL, Intravenous, Q12H, Croitoru, Mihai, MD  Clinical Interview:   The following information was obtained during a clinical interview with Mr. Hing and his daughter prior to cognitive testing.  Cognitive Symptoms: Decreased short-term memory: Endorsed. He reported difficulties with word/information retrieval, especially surrounding names of individuals. Information was said to often come back to him with time or when provided cueing. He also noted trouble misplacing objects around his residence. Difficulties were said to be present for the past several years and have appeared to gradually worsen over time. His daughter was in agreement with this assessment.  Decreased long-term memory: Denied. Decreased attention/concentration: Denied. Reduced processing speed: Denied. Difficulties with executive functions: Endorsed. Primary difficulties surrounded multi-tasking and organization. These were also said to be present for the  past several years. No trouble with impulsivity was noted. No significant personality changes were described outside of his daughter noting that Mr. Pryer has less of a filter while speaking to others.  Difficulties with emotion regulation: Denied. Difficulties with receptive language: Denied. Difficulties with word finding: Endorsed. Decreased visuoperceptual ability:  Endorsed. He noted more commonly bumping into door frames or furniture in his environment. He evidenced this by showing his forearms, which did exhibit notable bruising and scarring from previous injuries. He noted "my arms haven't always looked like this." This was said to have gradually worsened over time.   Difficulties completing ADLs: Largely denied. He reported independence with medication and financial management. He did acknowledge rare instances where he may forget to take a medication on time. He continues to drive. While he noted the potential for diminished navigational abilities, he and his daughter denied any unsafe driving behaviors.   Additional Medical History: History of traumatic brain injury/concussion: Denied. History of stroke: Denied. History of seizure activity: Denied. History of known exposure to toxins: Unclear. 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  Symptoms of chronic pain: Denied. Experience of frequent headaches/migraines: Denied. Headache symptoms were said to occur infrequently.  Frequent instances of dizziness/vertigo: Denied. He did report some dizziness occurring for a few seconds when standing up quickly or abruptly moving his head.  Sensory changes: He wears glasses with benefit. He reported ongoing hearing loss but has not had this formally evaluated. Other sensory changes/difficulties (e.g., taste or smell) were denied.  Balance/coordination difficulties: Denied. He also denied any recent falls.  Other motor difficulties: Endorsed. Notable bilateral resting tremors were observed in his upper extremities. He noted that medication intervention was effective initially. However, the effects have weaned over time, causing him to consider surgical intervention. He denied evidence for myoclonus or any other dyskinesia or involuntary movements.   Sleep History: Estimated hours obtained each night: 8 hours.   Difficulties falling asleep: Endorsed. Difficulties are often influenced by tremors where ongoing movement prevents him from falling asleep quickly.  Difficulties staying asleep: Endorsed. He reported waking throughout the night and then can have trouble falling back asleep.  Feels rested and refreshed upon awakening: Denied. He reported commonly taking a nap a few hours after he wakes in the morning due to ongoing fatigue.   History of snoring: Endorsed. History of waking up gasping for air: Endorsed. However, this was said to be related to startling dream content. Outside of these instances, this was not reported with regularity.  Witnessed breath cessation while asleep: Denied.  History of vivid dreaming: Endorsed. Excessive movement while asleep: Denied. He did report that he has been told he may talk or make various vocalizations in his sleep. He denied any physical actions.  Instances of acting out his dreams: Denied.  Psychiatric/Behavioral Health History: Depression: He described his current mood as "all right." He denied to his knowledge any formal mental health concerns or diagnoses. His daughter added that he has been put on psychiatric medications in the past. This aligns with his medical records which do suggest prior anxiety and depression. Per his daughter, symptoms are directly related to his adjustment to Parkinson's disease and ongoing limitations given the significant of his tremors. Current or remote suicidal ideation, intent, or plan was denied.  Anxiety: Denied. Mania: Denied. Trauma History: Denied. Visual/auditory hallucinations: Denied. Delusional thoughts: Denied.  Tobacco: Denied. Alcohol: He denied current alcohol consumption as well as a  history of problematic alcohol abuse or dependence.  Recreational drugs: Denied.  Family History: Adopted: Yes  Problem Relation Age of Onset   Cancer Mother        bone; kidney   Cancer Father    Heart attack Father     This information was confirmed by Mr. Saraceno.  Academic/Vocational History: Highest level of educational attainment: 12 years. He graduated from high school and described himself as an average (B/C) student in academic settings. Math was noted as a likely relative weakness.  History of developmental delay: Denied. History of grade repetition: Denied. Enrollment in special education courses: Denied. History of LD/ADHD: Denied.  Employment: Retired. He previously worked in Theatre manager. 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   Evaluation Results:   Behavioral Observations: Mr. Revak was accompanied by his daughter, arrived to his appointment on time, and was appropriately dressed and groomed. He appeared alert and oriented. Observed gait and station were within normal limits. Significant resting tremors were observed in his upper extremities bilaterally throughout the interview. His affect was generally relaxed and positive, but did range appropriately given the subject being discussed during the clinical interview or the task at hand during testing procedures. Spontaneous speech was fluent and word finding difficulties were not observed during the clinical interview or testing procedures. Thought processes were coherent, organized, and normal in content. Insight into his cognitive difficulties appeared adequate.  During testing, he was noted to be extremely anxious. He expressed anxiety outwardly on several occasions and at one point had to take a break in the middle of memory testing due to escalating symptoms (he walked to the waiting room and spent several minutes sitting with his daughter). The evaluation was minimally abbreviated in response increasing anxiety. Otherwise, sustained attention was appropriate. Task engagement was adequate and he persisted when challenged. Tremors involving his upper extremities were quite noteworthy during testing. Non-motor  tasks were prioritized as a result. Overall, Mr. Ketchum was cooperative with the clinical interview and subsequent testing procedures.   Adequacy of Effort: The validity of neuropsychological testing is limited by the extent to which the individual being tested may be assumed to have exerted adequate effort during testing. Mr. Martine expressed his intention to perform to the best of his abilities and exhibited adequate task engagement and persistence. Scores across stand-alone and embedded performance validity measures were variable but more often below expectation. As such, the results of the current evaluation should be interpreted with some caution and lower scores may underestimate true cognitive abilities.  Test Results: Mr. Niu was largely oriented at the time of the current evaluation. Points were lost for him being one day off when stating the current date and 84 minutes off when estimating the current time (he estimated the time being 12:00 despite having a 1:00 appointment).   Intellectual abilities based upon educational and vocational attainment were estimated to be in the below average to average range. Premorbid abilities were estimated to be within the well below average range based upon a single-word reading test.  Processing speed was below average to average. Basic attention was below average. More complex attention (e.g., working memory) was also below average. Executive functioning was variable, ranging from the exceptionally low to average normative ranges.  Assessed receptive language abilities (i.e., understanding more complex sentence structure) were exceptionally low. However, Mr. Yonkers did not exhibit any difficulties comprehending task instructions and answered all questions asked of him appropriately during interview, suggesting  that these abilities may be functionally appropriate. Assessed expressive language was exceptionally low to well below average across fluency tasks, while  confrontation naming was below average   Assessed visuospatial/visuoconstructional abilities were below average.    Learning (i.e., encoding) of novel verbal and visual information was exceptionally low to well below average. Spontaneous delayed recall (i.e., retrieval) of previously learned information was mildly variable, ranging from the exceptionally low to below average normative ranges. Retention rates were 76% across a story learning task, 0% across a list learning task, and 100% across a shape learning task. Performance across recognition tasks was below average across a visual task but exceptionally low across two verbal tasks, suggesting limited evidence for information consolidation.   Results of emotional screening instruments suggested that recent symptoms of generalized anxiety were in the mild range, while symptoms of depression were also within the mild range. A screening instrument assessing recent sleep quality suggested the presence of minimal sleep dysfunction.  Tables of Scores:   Note: This summary of test scores accompanies the interpretive report and should not be considered in isolation without reference to the appropriate sections in the text. Descriptors are based on appropriate normative data and may be adjusted based on clinical judgment. Terms such as "Within Normal Limits" and "Outside Normal Limits" are used when a more specific description of the test score cannot be determined.       Percentile - Normative Descriptor > 98 - Exceptionally High 91-97 - Well Above Average 75-90 - Above Average 25-74 - Average 9-24 - Below Average 2-8 - Well Below Average < 2 - Exceptionally Low       Validity:   DESCRIPTOR       ACS Word Choice: --- --- Outside Normal Limits  Dot Counting Test: --- --- Within Normal Limits  NAB EVI: --- --- Outside Normal Limits       Orientation:      Raw Score Percentile   NAB Orientation, Form 1 27/29 --- ---       Intellectual  Functioning:      Standard Score Percentile   Test of Premorbid Functioning: 57 6 Well Below Average       Memory:     NAB Memory Module, Form 1: Standard Score/ T Score Percentile   Total Memory Index 64 1 Exceptionally Low  List Learning       Total Trials 1-3 8/36 (19) <1 Exceptionally Low    Short Delay Free Recall 1/12 (28) 2 Well Below Average    Long Delay Free Recall 0/12 (30) 2 Well Below Average    Retention Percentage 0 (15) <1 Exceptionally Low    Recognition Discriminability -4 (17) <1 Exceptionally Low  Shape Learning       Total Trials 1-3 7/27 (30) 2 Well Below Average    Delayed Recall 3/9 (39) 14 Below Average    Retention Percentage 100 (51) 54 Average    Recognition Discriminability 5 (42) 21 Below Average  Story Learning       Immediate Recall 32/80 (34) 5 Well Below Average    Delayed Recall 19/40 (39) 14 Below Average    Retention Percentage 76 (43) 25 Average  Daily Living Memory       Immediate Recall 25/51 (33) 5 Well Below Average    Delayed Recall 4/17 (24) <1 Exceptionally Low    Retention Percentage 36 (15) <1 Exceptionally Low    Recognition Hits 5/10 (20) <1 Exceptionally Low  Attention/Executive Function:     Oral Trail Making Test (OTMT): Raw Score (Z-Score) Percentile     Part A 8 secs., 0  errors (-0.33) 37 Average    Part B 92 secs.,  2 errors (-2.94) <1 Exceptionally Low       Symbol Digit Modalities Test (SDMT): Raw Score (Z-Score) Percentile     Oral 31 (-0.98) 16 Below Average       NAB Attention Module, Form 1: T Score Percentile     Digits Forward 41 18 Below Average    Digits Backwards 39 14 Below Average        Scaled Score Percentile   WAIS-IV Similarities: 6 9 Below Average       D-KEFS Verbal Fluency Test: Raw Score (Scaled Score) Percentile     Letter Total Correct 10 (2) <1 Exceptionally Low    Category Total Correct 15 (2) <1 Exceptionally Low    Category Switching Total Correct 10 (7) 16 Below Average     Category Switching Accuracy 9 (8) 25 Average      Total Set Loss Errors 4 (8) 25 Average      Total Repetition Errors 2 (11) 63 Average       Language:     Boston Diagnostic Aphasia Examination (BDAE) Raw Score Percentile     Complex Ideational Material: 5/12 --- Exceptionally Low       Verbal Fluency Test: Raw Score (T Score) Percentile     Phonemic Fluency (FAS) 10 (23) <1 Exceptionally Low    Animal Fluency 9 (28) 2 Well Below Average             NAB Language Module, Form 1: T Score Percentile     Naming 28/31 (39) 14 Below Average       Visuospatial/Visuoconstruction:     NAB Spatial Module, Form 1: T Score Percentile     Visual Discrimination 42 21 Below Average        Scaled Score Percentile   WAIS-IV Matrix Reasoning: 7 16 Below Average  WAIS-IV Visual Puzzles: 7 16 Below Average       Mood and Personality:      Raw Score Percentile   Geriatric Depression Scale: 16 --- Mild  Geriatric Anxiety Scale: 21 --- Mild    Somatic 15 --- Severe    Cognitive 2 --- Minimal    Affective 4 --- Mild       Additional Questionnaires:      Raw Score Percentile   PROMIS Sleep Disturbance Questionnaire: 20 --- None to Slight   Informed Consent and Coding/Compliance:   Mr. Hernon was provided with a verbal description of the nature and purpose of the present neuropsychological evaluation. Also reviewed were the foreseeable risks and/or discomforts and benefits of the procedure, limits of confidentiality, and mandatory reporting requirements of this provider. The patient was given the opportunity to ask questions and receive answers about the evaluation. Oral consent to participate was provided by the patient.   This evaluation was conducted by Christia Reading, Ph.D., ABPP-CN, board certified clinical neuropsychologist. Mr. Irby completed a comprehensive clinical interview with Dr. Melvyn Novas, billed as one unit [phone], and 135 minutes of cognitive testing and scoring, billed as one unit [phone] and  four additional units [id number]. Psychometrist Milana Kidney, B.S., assisted Dr. Melvyn Novas with test administration and scoring procedures. As a separate and discrete service, Dr. Melvyn Novas spent a total of 180 minutes in interpretation and report writing billed as one unit [phone] and two  units [id number].

## 2022-03-31 ENCOUNTER — Encounter: Payer: Self-pay | Admitting: Psychology

## 2022-03-31 ENCOUNTER — Telehealth (HOSPITAL_COMMUNITY): Payer: Self-pay | Admitting: *Deleted

## 2022-03-31 NOTE — Telephone Encounter (Signed)
Left message on voicemail per DPR in reference to upcoming appointment scheduled on  04/03/22 with detailed instructions given per Myocardial Perfusion Study Information Sheet for the test. LM to arrive 15 minutes early, and that it is imperative to arrive on time for appointment to keep from having the test rescheduled. If you need to cancel or reschedule your appointment, please call the office within 24 hours of your appointment. Failure to do so may result in a cancellation of your appointment, and a $50 no show fee. Phone number given for call back for any questions. Kirstie Peri

## 2022-04-03 ENCOUNTER — Ambulatory Visit (HOSPITAL_COMMUNITY): Payer: PPO | Attending: Cardiology

## 2022-04-03 DIAGNOSIS — I208 Other forms of angina pectoris: Secondary | ICD-10-CM | POA: Diagnosis not present

## 2022-04-03 LAB — MYOCARDIAL PERFUSION IMAGING
LV dias vol: 112 mL (ref 62–150)
LV sys vol: 44 mL
Nuc Stress EF: 61 %
Peak HR: 78 {beats}/min
Rest HR: 60 {beats}/min
Rest Nuclear Isotope Dose: 10.7 mCi
SDS: 2
SRS: 8
SSS: 10
ST Depression (mm): 0 mm
Stress Nuclear Isotope Dose: 32.4 mCi
TID: 1

## 2022-04-03 MED ORDER — REGADENOSON 0.4 MG/5ML IV SOLN
0.4000 mg | Freq: Once | INTRAVENOUS | Status: AC
  Administered 2022-04-03: 0.4 mg via INTRAVENOUS

## 2022-04-03 MED ORDER — TECHNETIUM TC 99M TETROFOSMIN IV KIT
10.7000 | PACK | Freq: Once | INTRAVENOUS | Status: AC | PRN
  Administered 2022-04-03: 10.7 via INTRAVENOUS

## 2022-04-03 MED ORDER — TECHNETIUM TC 99M TETROFOSMIN IV KIT
32.4000 | PACK | Freq: Once | INTRAVENOUS | Status: AC | PRN
  Administered 2022-04-03: 32.4 via INTRAVENOUS

## 2022-04-09 ENCOUNTER — Ambulatory Visit (INDEPENDENT_AMBULATORY_CARE_PROVIDER_SITE_OTHER): Payer: PPO | Admitting: Psychology

## 2022-04-09 DIAGNOSIS — G3184 Mild cognitive impairment, so stated: Secondary | ICD-10-CM

## 2022-04-09 DIAGNOSIS — G20 Parkinson's disease: Secondary | ICD-10-CM

## 2022-04-09 NOTE — Progress Notes (Signed)
   Neuropsychology Feedback Session Tillie Rung. [place] [facility]     Mr. Christopher Weeks did not show for his scheduled feedback session. His report will be mailed to his address on file. He is encouraged to follow-up with Dr. Carles Collet for ongoing neurological care.

## 2022-04-15 ENCOUNTER — Encounter: Payer: Self-pay | Admitting: *Deleted

## 2022-04-17 ENCOUNTER — Encounter: Payer: Self-pay | Admitting: *Deleted

## 2022-04-23 NOTE — Progress Notes (Deleted)
Assessment/Plan:   1.  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    -continue Pramipexole, 0.5 mg, 2/2/1 (7am/11am/4pm)  -Patient had neurocognitive testing and I personally talked to Dr. Melvyn Novas about this.  He stated that, taken at face value, the patient's results showed severe impairment, and we generally would not do DBS in somebody with severe impairment.  However, Dr. Melvyn Novas felt that the patient had panicked during the test and the test anxiety severely influenced of the results of the test.  We discussed this today and ultimately, if we are going to proceed with DBS, I think it may be best to proceed with PET scan to see if there is true cognitive impairment/dementia and to rule out AD versus FTD     2.  Insomnia  -on melatonin  -avoid napping so late and no longer than 1 hour.  -on mirtazapine   3.  Coronary artery disease  -Cardiology is going to repeat Myoview before consideration for DBS.  Subjective:   Christopher Weeks was seen today in follow up for Parkinsons disease.  My previous records were reviewed prior to todays visit as well as outside records available to me.  Patient here for levodopa challenge times.  Patient has helped levodopa since ***.  Patient had neurocognitive testing since last visit.  I personally talked to Dr. Melvyn Novas about this.  He stated that, taken at face value, the patient's results showed severe impairment, and we generally would not do DBS in somebody with severe impairment.  However, Dr. Melvyn Novas felt that the patient had panicked during the test and the test anxiety severely influenced of the results of the test.  Patient did follow-up with cardiology since last visit in regards to whether or not he would be a DBS candidate from cardiology standpoint.   I spoke to the cardiologist personally about him.  Cardiology noted that there is a severe perfusion abnormality in the  distribution of the ramus intermedius artery (where we were unable to place a stent) and the small left circumflex coronary artery, which we know has a very tight blockage, but is too small for stenting (cath 2020).  The wall motion appears normal in that area, which fits with very severe ischemia, but viable myocardium (not a scar).  The area is moderate in size in cardiologist opinion.  There is no evidence of ischemia in the distribution of the LAD artery or right coronary artery.  Overall heart pumping function is normal.  He did not think that cardiac catheterization need to be repeated, but did feel that the patient needed a Myoview.  Current prescribed movement disorder medications: Carbidopa/levodopa 25/100, 2 tablets 3 times per day  Pramipexole 0.5 mg, 2/2/1  mirtazapine Celexa  PREVIOUS MEDICATIONS: Sinemet and Mirapex;  ALLERGIES:   Allergies  Allergen Reactions   Codeine Hives    Denies Airway involvement    CURRENT MEDICATIONS:  Outpatient Encounter Medications as of 04/25/2022  Medication Sig   amLODipine (NORVASC) 5 MG tablet TAKE ONE TABLET BY MOUTH ONCE DAILY.   aspirin 81 MG tablet Take 81 mg by mouth daily.   atorvastatin (LIPITOR) 80 MG tablet TAKE (1) TABLET BY MOUTH AT BEDTIME.   Calcium-Vitamin D 600-125 MG-UNIT TABS Take 1 tablet by mouth daily.    carbidopa-levodopa (SINEMET IR) 25-100 MG tablet TAKE 2 TABLETS BY MOUTH THREE TIMES DAILY.   citalopram (CELEXA) 40 MG tablet TAKE ONE TABLET BY  MOUTH DAILY.   clopidogrel (PLAVIX) 75 MG tablet TAKE 1 TABLET BY MOUTH ONCE A DAY.   esomeprazole (NEXIUM) 20 MG capsule TAKE 1 CAPSULE BY MOUTH EVERY MORNING.   ezetimibe (ZETIA) 10 MG tablet TAKE 1 TABLET BY MOUTH ONCE A DAY.   isosorbide mononitrate (IMDUR) 120 MG 24 hr tablet TAKE ONE TABLET BY MOUTH ONCE DAILY.   lisinopril (ZESTRIL) 40 MG tablet TAKE ONE TABLET BY MOUTH ONCE DAILY.   mirtazapine (REMERON SOL-TAB) 15 MG disintegrating tablet Take 1 tablet (15 mg total)  by mouth at bedtime.   nitroGLYCERIN (NITROSTAT) 0.4 MG SL tablet DISSOLVE 1 TABLET SUBLINGUALLY AS NEEDED FOR CHEST PAIN, MAY REPEAT EVERY 5 MINUTES. AFTER 3 CALL 911.   pramipexole (MIRAPEX) 0.5 MG tablet TAKE (2) TABLETS IN THE MORNING TAKE (2) TABLETS IN THE AFTERNOON THEN TAKE (1) TABLET IN THE EVENING.   Facility-Administered Encounter Medications as of 04/25/2022  Medication   sodium chloride flush (NS) 0.9 % injection 3 mL    Objective:   PHYSICAL EXAMINATION:    VITALS:   There were no vitals filed for this visit.      GEN:  The patient appears stated age and is in NAD. HEENT:  Normocephalic, atraumatic.  The mucous membranes are moist. Cardiovascular: Regular rate rhythm Lungs: Clear to auscultation bilaterally Neck: No bruits.  Neurological examination:  Orientation: The patient is alert and oriented x3. Cranial nerves: There is good facial symmetry with mild facial hypomimia. The speech is fluent and clear. Soft palate rises symmetrically and there is no tongue deviation. Hearing is intact to conversational tone. Sensation: Sensation is intact to light touch throughout Motor: Strength is at least antigravity x4.  Movement examination: Tone: There is mild to mod increased tone in the RUE/LLE Abnormal movements: there is bilateral UE rest tremor and LLE rest tremor, mod Coordination:  There is no decremation with RAM's, with any form of RAMS, including alternating supination and pronation of the forearm, hand opening and closing, finger taps, heel taps and toe taps. Gait and Station: The patient has no difficulty arising out of a deep-seated chair without the use of the hands. The patient's stride length is good.    I have reviewed and interpreted the following labs independently    Chemistry      Component Value Date/Time   NA 141 03/26/2021 1001   K 5.0 03/26/2021 1001   CL 103 03/26/2021 1001   CO2 22 03/26/2021 1001   BUN 20 03/26/2021 1001   CREATININE  1.11 03/26/2021 1001   CREATININE 0.91 01/31/2016 1136      Component Value Date/Time   CALCIUM 10.0 03/26/2021 1001   ALKPHOS 74 04/15/2018 1805   AST 23 04/15/2018 1805   ALT 8 (L) 04/15/2018 1805   BILITOT 0.9 04/15/2018 1805   BILITOT 0.6 04/08/2018 1052       Lab Results  Component Value Date   WBC 11.7 (H) 05/17/2019   HGB 14.1 05/17/2019   HCT 40.5 05/17/2019   MCV 91.2 05/17/2019   PLT 228 05/17/2019    Lab Results  Component Value Date   TSH 1.80 08/30/2019     Total time spent on today's visit was *** minutes, including both face-to-face time and nonface-to-face time.  Time included that spent on review of records (prior notes available to me/labs/imaging if pertinent), discussing treatment and goals, answering patient's questions and coordinating care.  Cc:  Zenia Resides, MD

## 2022-04-24 ENCOUNTER — Other Ambulatory Visit: Payer: Self-pay | Admitting: Neurology

## 2022-04-24 ENCOUNTER — Telehealth: Payer: Self-pay | Admitting: Neurology

## 2022-04-24 DIAGNOSIS — G20 Parkinson's disease: Secondary | ICD-10-CM

## 2022-04-24 NOTE — Telephone Encounter (Signed)
Spoke with pt's wife. She wants to make sure Dr. Carles Collet is aware of the of the testing the patient did with [facility] before he does the On/Off test.

## 2022-04-24 NOTE — Telephone Encounter (Signed)
Pt wife called she stated that they wanted Dr Tat to know that they got a letter from heart care, the wife stated that she think that his heart isn't good but the letter states ( I do not think we need to repeat a cardiac catheterization before the planned deep brain stimulation procedure.) She said that they didn't keep his appointment for tomorrow because they thought it was more invasive, I told them that with the on off test he does not take his medication and then we give it to him to see how it affects him. Pt wife stated OH I thought it was more to it. When pt walked out the room pt wife stated that she didn't think his heart could handle the DBS. When he came back in the room she said that he wants the DBS and that they will call heart care to get results that the letter talked about. Pt wife informed that I would send this information to Dr Tat

## 2022-04-25 ENCOUNTER — Other Ambulatory Visit: Payer: Self-pay

## 2022-04-25 ENCOUNTER — Ambulatory Visit: Payer: PPO | Admitting: Neurology

## 2022-04-25 DIAGNOSIS — G20 Parkinson's disease: Secondary | ICD-10-CM

## 2022-04-25 MED ORDER — PRAMIPEXOLE DIHYDROCHLORIDE 0.5 MG PO TABS: ORAL_TABLET | ORAL | 0 refills | Status: DC

## 2022-05-07 ENCOUNTER — Telehealth: Payer: Self-pay | Admitting: Neurology

## 2022-05-07 ENCOUNTER — Telehealth: Payer: Self-pay

## 2022-05-07 NOTE — Telephone Encounter (Signed)
Pt's wife called in stating the pt has an On/Off test on Friday. She is wanting to find out when the pt is supposed to stop taking his parkinson's medication?

## 2022-05-07 NOTE — Telephone Encounter (Signed)
Called Patient to remind him that the On/Off test and to stop his parkinson's meds carbidopa levodopa and pramipexole. No answer left voicemail also sent message on my chart

## 2022-05-07 NOTE — Telephone Encounter (Signed)
Called patiens wife  and verified to stop medication 24 hours before appointment

## 2022-05-08 NOTE — Progress Notes (Signed)
Assessment/Plan:   1.  Parkinsons Disease with levodopa resistant resistant tremor  -Increase carbidopa/levodopa 25/100, 3/3/2  -continue Pramipexole, 0.5 mg, 2/2/1 (7am/11am/4pm)  -We did levodopa challenge test today, May 09, 2022, and patient's tremor did respond fairly significantly to levodopa, although not completely.  Both patient and I were a bit surprised about this.  -Patient had neurocognitive testing and I personally talked to Dr. Melvyn Novas about this.  He stated that, taken at face value, the patient's results showed severe impairment, and we generally would not do DBS in somebody with severe impairment.  However, Dr. Melvyn Novas felt that the patient had panicked during the test and the test anxiety severely influenced of the results of the test.  We discussed this today and ultimately, if we are going to proceed with DBS, I think it may be best to proceed with PET scan to see if there is true cognitive impairment/dementia and to rule out AD versus FTD.  After some discussion, the patient stated that he would rather just increase levodopa and see how he did.  I think that is a very reasonable approach.  -Discussed some DBS reservations with the patient today, including Dr. Gustavus Bryant reservations, but also including the fact that the patient's wife called here since our last visit expressing reservations.  I do not generally perform DBS on patients unless the family is agreeable.  I have previously met the patient's children, who are very much on board, but I have actually never met the wife.  Patient expressed understanding, and as above we decided to just try to increase the medication for now.  Sleep   2.  Insomnia  -on melatonin  -avoid napping so late and no longer than 1 hour.  -on mirtazapine   3.  Coronary artery disease  -Cardiology noted patient had cardiac disease, but felt okay to proceed with DBS.  Nonetheless, patient and I decided to hold on that for now.  Subjective:    Christopher Weeks was seen today in follow up for Parkinsons disease.  My previous records were reviewed prior to todays visit as well as outside records available to me.  Patient here for levodopa challenge test.  Patient has been off levodopa since Wednesday at around 5 PM (it is currently Friday at 1pm).  He feels much more shaky than he thought.  He states that he did not think that the medication was helping, but now realizes it was.  He is much more sweaty than he was on medication.  Patient had neurocognitive testing since last visit.  I personally talked to Dr. Melvyn Novas about this.  He stated that, taken at face value, the patient's results showed severe impairment, and we generally would not do DBS in somebody with severe impairment.  However, Dr. Melvyn Novas felt that the patient had panicked during the test and the test anxiety severely influenced of the results of the test.  Patient did follow-up with cardiology since last visit in regards to whether or not he would be a DBS candidate from cardiology standpoint.   I spoke to the cardiologist personally about him.  Cardiology noted that there is a severe perfusion abnormality in the distribution of the ramus intermedius artery (where we were unable to place a stent) and the small left circumflex coronary artery, which we know has a very tight blockage, but is too small for stenting (cath 2020).  The wall motion appears normal in that area, which fits with very severe ischemia, but viable  myocardium (not a scar).  The area is moderate in size in cardiologist opinion.  There is no evidence of ischemia in the distribution of the LAD artery or right coronary artery.  Overall heart pumping function is normal.      Current prescribed movement disorder medications: Carbidopa/levodopa 25/100, 2 tablets 3 times per day  Pramipexole 0.5 mg, 2/2/1  mirtazapine Celexa  PREVIOUS MEDICATIONS: Sinemet and Mirapex;  ALLERGIES:   Allergies  Allergen Reactions    Codeine Hives    Denies Airway involvement    CURRENT MEDICATIONS:  Outpatient Encounter Medications as of 05/09/2022  Medication Sig   amLODipine (NORVASC) 5 MG tablet TAKE ONE TABLET BY MOUTH ONCE DAILY.   aspirin 81 MG tablet Take 81 mg by mouth daily.   atorvastatin (LIPITOR) 80 MG tablet TAKE (1) TABLET BY MOUTH AT BEDTIME.   Calcium-Vitamin D 600-125 MG-UNIT TABS Take 1 tablet by mouth daily.    carbidopa-levodopa (SINEMET IR) 25-100 MG tablet TAKE 2 TABLETS BY MOUTH THREE TIMES DAILY.   citalopram (CELEXA) 40 MG tablet TAKE ONE TABLET BY MOUTH DAILY.   clopidogrel (PLAVIX) 75 MG tablet TAKE 1 TABLET BY MOUTH ONCE A DAY.   esomeprazole (NEXIUM) 20 MG capsule TAKE 1 CAPSULE BY MOUTH EVERY MORNING.   ezetimibe (ZETIA) 10 MG tablet TAKE 1 TABLET BY MOUTH ONCE A DAY.   isosorbide mononitrate (IMDUR) 120 MG 24 hr tablet TAKE ONE TABLET BY MOUTH ONCE DAILY.   lisinopril (ZESTRIL) 40 MG tablet TAKE ONE TABLET BY MOUTH ONCE DAILY.   mirtazapine (REMERON SOL-TAB) 15 MG disintegrating tablet Take 1 tablet (15 mg total) by mouth at bedtime.   nitroGLYCERIN (NITROSTAT) 0.4 MG SL tablet DISSOLVE 1 TABLET SUBLINGUALLY AS NEEDED FOR CHEST PAIN, MAY REPEAT EVERY 5 MINUTES. AFTER 3 CALL 911.   pramipexole (MIRAPEX) 0.5 MG tablet TAKE (2) TABLETS IN THE MORNING TAKE (2) TABLETS IN THE AFTERNOON THEN TAKE (1) TABLET IN THE EVENING.   pramipexole (MIRAPEX) 0.5 MG tablet TAKE (2) TABLETS IN THE MORNING TAKE (2) TABLETS IN THE AFTERNOON THEN TAKE (1) TABLET IN THE EVENING. (Patient not taking: Reported on 05/09/2022)   Facility-Administered Encounter Medications as of 05/09/2022  Medication   sodium chloride flush (NS) 0.9 % injection 3 mL    Objective:   PHYSICAL EXAMINATION:    VITALS:   Vitals:   05/09/22 1246  BP: 132/82  Pulse: 67  SpO2: 97%  Weight: 222 lb (100.7 kg)  Height: 6' (1.829 m)        GEN:  The patient appears stated age and is in NAD. HEENT:  Normocephalic, atraumatic.   The mucous membranes are moist.   Neurological examination:  Orientation: The patient is alert and oriented x3. Cranial nerves: There is good facial symmetry with mild facial hypomimia. The speech is fluent and clear. Soft palate rises symmetrically and there is no tongue deviation. Hearing is intact to conversational tone. Sensation: Sensation is intact to light touch throughout Motor: Strength is at least antigravity x4.  Levodopa challenge done today.  UPDRS motor off score was 30.  Pt then given 311m of levodopa dissolved in ginger ale and waited 456mutes to re-examine him.  UPDRS motor on score was 16.  Details of UPDRS motor score documented on separate neurophysiologic worksheet.     Movement examination: Tone: There is actually fairly good tone both pre and post levodopa (better than when I usually see him). Abnormal movements: there is bilateral UE rest tremor and LLE rest tremor,  mod prior to addition of levodopa.  Post levodopa, patient's right sided tremor was gone and left-sided tremor was markedly reduced in amplitude and frequency. Coordination:  There is no decremation with RAM's, with any form of RAMS, including alternating supination and pronation of the forearm, hand opening and closing, finger taps, heel taps and toe taps. Gait and Station: The patient has no difficulty arising out of a deep-seated chair without the use of the hands. The patient's stride length is good both pre and post levodopa.  He had a positive pull test prelevodopa (although he was trying to trick the examiner and be playful as well) and a negative pull test post levodopa.  I have reviewed and interpreted the following labs independently    Chemistry      Component Value Date/Time   NA 141 03/26/2021 1001   K 5.0 03/26/2021 1001   CL 103 03/26/2021 1001   CO2 22 03/26/2021 1001   BUN 20 03/26/2021 1001   CREATININE 1.11 03/26/2021 1001   CREATININE 0.91 01/31/2016 1136      Component Value  Date/Time   CALCIUM 10.0 03/26/2021 1001   ALKPHOS 74 04/15/2018 1805   AST 23 04/15/2018 1805   ALT 8 (L) 04/15/2018 1805   BILITOT 0.9 04/15/2018 1805   BILITOT 0.6 04/08/2018 1052       Lab Results  Component Value Date   WBC 11.7 (H) 05/17/2019   HGB 14.1 05/17/2019   HCT 40.5 05/17/2019   MCV 91.2 05/17/2019   PLT 228 05/17/2019    Lab Results  Component Value Date   TSH 1.80 08/30/2019     Total time spent on today's visit was 73 minutes, including both face-to-face time and nonface-to-face time.  Time included that spent on review of records (prior notes available to me/labs/imaging if pertinent), discussing treatment and goals, answering patient's questions and coordinating care.  This did not include the time spent waiting for levodopa to kick in.  Cc:  Zenia Resides, MD

## 2022-05-09 ENCOUNTER — Other Ambulatory Visit: Payer: Self-pay

## 2022-05-09 ENCOUNTER — Ambulatory Visit (INDEPENDENT_AMBULATORY_CARE_PROVIDER_SITE_OTHER): Payer: PPO | Admitting: Neurology

## 2022-05-09 ENCOUNTER — Encounter: Payer: Self-pay | Admitting: Neurology

## 2022-05-09 VITALS — BP 132/82 | HR 67 | Ht 72.0 in | Wt 222.0 lb

## 2022-05-09 DIAGNOSIS — I25118 Atherosclerotic heart disease of native coronary artery with other forms of angina pectoris: Secondary | ICD-10-CM | POA: Diagnosis not present

## 2022-05-09 DIAGNOSIS — G3184 Mild cognitive impairment, so stated: Secondary | ICD-10-CM

## 2022-05-09 DIAGNOSIS — G20 Parkinson's disease: Secondary | ICD-10-CM | POA: Diagnosis not present

## 2022-05-09 MED ORDER — CARBIDOPA-LEVODOPA 25-100 MG PO TABS: ORAL_TABLET | ORAL | 0 refills | Status: DC

## 2022-05-09 MED ORDER — CARBIDOPA-LEVODOPA 25-100 MG PO TABS: ORAL_TABLET | ORAL | 5 refills | Status: DC

## 2022-05-09 NOTE — Patient Instructions (Addendum)
Increase carbidopa/levodopa 25/100 to 3 tablets at 8am, 3 at noon, 2 at 5pm Continue pramipexole 0.5 mg three times per day

## 2022-05-22 ENCOUNTER — Other Ambulatory Visit: Payer: Self-pay | Admitting: Cardiovascular Disease

## 2022-06-16 ENCOUNTER — Ambulatory Visit (INDEPENDENT_AMBULATORY_CARE_PROVIDER_SITE_OTHER): Payer: PPO | Admitting: Family Medicine

## 2022-06-16 ENCOUNTER — Encounter: Payer: Self-pay | Admitting: Family Medicine

## 2022-06-16 DIAGNOSIS — I10 Essential (primary) hypertension: Secondary | ICD-10-CM | POA: Diagnosis not present

## 2022-06-16 DIAGNOSIS — G20 Parkinson's disease: Secondary | ICD-10-CM

## 2022-06-16 DIAGNOSIS — I25118 Atherosclerotic heart disease of native coronary artery with other forms of angina pectoris: Secondary | ICD-10-CM

## 2022-06-16 DIAGNOSIS — K089 Disorder of teeth and supporting structures, unspecified: Secondary | ICD-10-CM | POA: Insufficient documentation

## 2022-06-16 NOTE — Assessment & Plan Note (Signed)
Emphasize connection between dental health and cardiac health.  Encouraged him to follow up with dental extractions.

## 2022-06-16 NOTE — Assessment & Plan Note (Signed)
Asymptomatic.  Check lipids and renal function.

## 2022-06-16 NOTE — Progress Notes (Signed)
    SUBJECTIVE:   CHIEF COMPLAINT / HPI:   Multiple issues.  Has not been seen by me in several years.  We have some catching up.  Known CAD.  On ASA, plavix and statin.  Needs to check lipids to see if he is at goal.  No chest pain or DOE. Parkinsons.  He is followed by neurology.  They talked about a deep brain stimulator.  They tweaked his meds and he is doing a bit better.   Poor dentition.  Had a bad experience as a child.  Fears dentists.  Knows he needs several teeth extracted.   Hypertension.  I did not notice his considerably elevated BP during the visit.  I called and apologized.  Family follows BPs at home.  They have been normal to slightly elevated.  He knows his BP is high when anxious.  He was anxious during our visit.      OBJECTIVE:   BP (!) 176/140   Pulse (!) 51   Ht '5\' 5"'$  (1.651 m)   Wt 195 lb 12.8 oz (88.8 kg)   SpO2 97%   BMI 32.58 kg/m   Lungs clear Cardiac RRR without m or g Ext no edema Pill rolling tremor of parkinsons.  Mentation is normal. Terrible dentition.  ASSESSMENT/PLAN:   Parkinson's disease ([place]) Followed by neuro.  He is doing OK for now.    Coronary artery disease of native artery of native heart with stable angina pectoris Asymptomatic.  Check lipids and renal function.  Essential hypertension, benign Check BP at home and call.  They know goal is 140/90 or below.  Poor dentition Emphasize connection between dental health and cardiac health.  Encouraged him to follow up with dental extractions.     Zenia Resides, MD [place]

## 2022-06-16 NOTE — Assessment & Plan Note (Signed)
Followed by neuro.  He is doing OK for now.

## 2022-06-16 NOTE — Assessment & Plan Note (Addendum)
Check BP at home and call.  They know goal is 140/90 or below.  When I called with lab results, home blood pressures [id number] systolic and 69-67 diastololic

## 2022-06-16 NOTE — Patient Instructions (Signed)
I will call next week with the results of the blood work. I think you are doing well The biggest thing I can see is that you need to get your teeth taken care of.   Keep having fun and enjoy the move to [place].

## 2022-06-17 LAB — CMP14+EGFR
ALT: 17 IU/L (ref 0–44)
AST: 17 IU/L (ref 0–40)
Albumin/Globulin Ratio: 2 (ref 1.2–2.2)
Albumin: 4.7 g/dL (ref 3.9–4.9)
Alkaline Phosphatase: 122 IU/L — ABNORMAL HIGH (ref 44–121)
BUN/Creatinine Ratio: 15 (ref 10–24)
BUN: 15 mg/dL (ref 8–27)
Bilirubin Total: 0.5 mg/dL (ref 0.0–1.2)
CO2: 22 mmol/L (ref 20–29)
Calcium: 10.1 mg/dL (ref 8.6–10.2)
Chloride: 102 mmol/L (ref 96–106)
Creatinine, Ser: 0.97 mg/dL (ref 0.76–1.27)
Globulin, Total: 2.3 g/dL (ref 1.5–4.5)
Glucose: 138 mg/dL — ABNORMAL HIGH (ref 70–99)
Potassium: 4.6 mmol/L (ref 3.5–5.2)
Sodium: 141 mmol/L (ref 134–144)
Total Protein: 7 g/dL (ref 6.0–8.5)
eGFR: 85 mL/min/{1.73_m2} (ref >59)

## 2022-06-17 LAB — CBC
Hematocrit: 41.8 % (ref 37.5–51.0)
Hemoglobin: 14 g/dL (ref 13.0–17.7)
MCH: 31.2 pg (ref 26.6–33.0)
MCHC: 33.5 g/dL (ref 31.5–35.7)
MCV: 93 fL (ref 79–97)
Platelets: 270 10*3/uL (ref 150–450)
RBC: 4.49 x10E6/uL (ref 4.14–5.80)
RDW: 12.8 % (ref 11.6–15.4)
WBC: 11.4 10*3/uL — ABNORMAL HIGH (ref 3.4–10.8)

## 2022-06-17 LAB — LIPID PANEL
Chol/HDL Ratio: 3.8 ratio (ref 0.0–5.0)
Cholesterol, Total: 136 mg/dL (ref 100–199)
HDL: 36 mg/dL — ABNORMAL LOW (ref >39)
LDL Chol Calc (NIH): 76 mg/dL (ref 0–99)
Triglycerides: 136 mg/dL (ref 0–149)
VLDL Cholesterol Cal: 24 mg/dL (ref 5–40)

## 2022-06-24 ENCOUNTER — Other Ambulatory Visit: Payer: Self-pay | Admitting: Family Medicine

## 2022-07-24 ENCOUNTER — Other Ambulatory Visit: Payer: Self-pay | Admitting: Family Medicine

## 2022-07-24 ENCOUNTER — Other Ambulatory Visit: Payer: Self-pay | Admitting: Cardiovascular Disease

## 2022-08-21 ENCOUNTER — Other Ambulatory Visit: Payer: Self-pay | Admitting: Neurology

## 2022-08-21 DIAGNOSIS — G20A1 Parkinson's disease without dyskinesia, without mention of fluctuations: Secondary | ICD-10-CM

## 2022-11-19 ENCOUNTER — Other Ambulatory Visit: Payer: Self-pay | Admitting: Family Medicine

## 2022-11-19 ENCOUNTER — Other Ambulatory Visit: Payer: Self-pay | Admitting: Neurology

## 2022-11-19 DIAGNOSIS — F321 Major depressive disorder, single episode, moderate: Secondary | ICD-10-CM

## 2022-11-19 DIAGNOSIS — G20A1 Parkinson's disease without dyskinesia, without mention of fluctuations: Secondary | ICD-10-CM

## 2022-11-25 ENCOUNTER — Encounter: Payer: Self-pay | Admitting: Neurology

## 2022-11-25 ENCOUNTER — Ambulatory Visit (INDEPENDENT_AMBULATORY_CARE_PROVIDER_SITE_OTHER): Payer: PPO | Admitting: Neurology

## 2022-11-25 VITALS — BP 116/72 | HR 62 | Ht 72.0 in | Wt 212.0 lb

## 2022-11-25 DIAGNOSIS — G20A2 Parkinson's disease without dyskinesia, with fluctuations: Secondary | ICD-10-CM

## 2022-11-25 DIAGNOSIS — G20A1 Parkinson's disease without dyskinesia, without mention of fluctuations: Secondary | ICD-10-CM

## 2022-11-25 MED ORDER — CARBIDOPA-LEVODOPA 25-100 MG PO TABS: ORAL_TABLET | ORAL | 1 refills | Status: DC

## 2022-11-25 MED ORDER — CARBIDOPA-LEVODOPA ER 50-200 MG PO TBCR: 1.0000 | EXTENDED_RELEASE_TABLET | Freq: Every day | ORAL | 1 refills | Status: DC

## 2022-11-25 NOTE — Progress Notes (Signed)
Assessment/Plan:   1.  Parkinsons Disease with levodopa resistant resistant tremor  -pt looks much better than last visit!  -Add carbidopa/levodopa 25/100, 3 at 7am/3 at 11am/2 at 3pm/1 at 7pm  -add carbidopa/levodopa 50/200 at bed for first AM on  -continue Pramipexole, 0.5 mg, 2/2/1 (7am/11am/4pm)  -Levodopa challenge to May 09, 2022 and patient's tremor responded fairly significantly to levodopa, although not completely.  This was actually a surprise to both of [place].    -Patient had neurocognitive testing and I personally talked to Dr. Melvyn Novas about this.  He stated that, taken at face value, the patient's results showed severe impairment, and we generally would not do DBS in somebody with severe impairment.  However, Dr. Melvyn Novas felt that the patient had panicked during the test and the test anxiety severely influenced of the results of the test.  We discussed this today and ultimately, if we are going to proceed with DBS, I think it may be best to proceed with PET scan to see if there is true cognitive impairment/dementia and to rule out AD versus FTD.  For now, patient has decided to hold on that and be treated medically.  -Discussed some DBS reservations with the patient today, including Dr. Gustavus Bryant reservations, but also including the fact that the patient's wife called here previously expressing reservations.  I do not generally perform DBS on patients unless the family is agreeable.  I have previously met the patient's children, who are very much on board, but I have actually never met the wife.  Patient expressed understanding, and as above we decided to just try to increase the medication for now.    -will let me know if needs referral to mov't physician in [place] (he may be moving)  2.  Insomnia  -on melatonin  -avoid napping so late and no longer than 1 hour.  -on mirtazapine  3.  Coronary artery disease  -Cardiology noted patient had cardiac disease, but felt okay to proceed with DBS.   Nonetheless, patient and I decided to hold on that for now.  Subjective:   Christopher Weeks was seen today in follow up for Parkinsons disease.  My previous records were reviewed prior to todays visit as well as outside records available to me.  Last visit, the patient had a levodopa challenge test, which surprised both of those and showed both because that levodopa did help tremor, although incompletely so.  We decided to hold off on pursuing DBS for now.  If we do it in the future, we would likely consider PET scan given results of his neurocognitive testing. He saw primary care in August and notes indicate that he was moving to [place].  Pt states that they are thinking about it but haven't made any decisions yet.  His son is in [place] but its hard to leave his home in [place].  He has had no falls.  He did hurt his leg on the storm door. His wife did and he has been caregiving for her over the last week.  No hallucinations.  He is doing okay with the increased dose of levodopa but lots of tremor when he first wakes up and late in the evening.  He wakes up at 7am but doesn't go back to bed until close to midnight.   Current prescribed movement disorder medications: Carbidopa/levodopa 25/100, 3/3/2 (increase last visit) Pramipexole 0.5 mg, 2/2/1  mirtazapine Celexa  PREVIOUS MEDICATIONS: Sinemet and Mirapex;  ALLERGIES:   Allergies  Allergen  Reactions   Codeine Hives    Denies Airway involvement    CURRENT MEDICATIONS:  Outpatient Encounter Medications as of 11/25/2022  Medication Sig   amLODipine (NORVASC) 5 MG tablet Take 1 tablet (5 mg total) by mouth daily.   aspirin 81 MG tablet Take 81 mg by mouth daily.   atorvastatin (LIPITOR) 80 MG tablet TAKE (1) TABLET BY MOUTH AT BEDTIME.   Calcium-Vitamin D 600-125 MG-UNIT TABS Take 1 tablet by mouth daily.    carbidopa-levodopa (SINEMET CR) 50-200 MG tablet Take 1 tablet by mouth at bedtime.   citalopram (CELEXA) 40 MG tablet TAKE  ONE TABLET BY MOUTH DAILY.   clopidogrel (PLAVIX) 75 MG tablet TAKE 1 TABLET BY MOUTH ONCE A DAY.   esomeprazole (NEXIUM) 20 MG capsule TAKE 1 CAPSULE BY MOUTH EVERY MORNING.   ezetimibe (ZETIA) 10 MG tablet TAKE 1 TABLET BY MOUTH ONCE A DAY.   famotidine (PEPCID) 20 MG tablet TAKE 1 TABLET BY MOUTH 2 TIMES DAILY.   isosorbide mononitrate (IMDUR) 120 MG 24 hr tablet TAKE ONE TABLET BY MOUTH ONCE DAILY.   lisinopril (ZESTRIL) 40 MG tablet Take 1 tablet (40 mg total) by mouth daily.   nitroGLYCERIN (NITROSTAT) 0.4 MG SL tablet DISSOLVE 1 TABLET SUBLINGUALLY AS NEEDED FOR CHEST PAIN, MAY REPEAT EVERY 5 MINUTES. AFTER 3 CALL 911.   pramipexole (MIRAPEX) 0.5 MG tablet TAKE (2) TABLETS IN THE MORNING TAKE (2) TABLETS IN THE AFTERNOON THEN TAKE (1) TABLET IN THE EVENING.   [DISCONTINUED] carbidopa-levodopa (SINEMET IR) 25-100 MG tablet TAKE (3) TABLETS AT 8AM, (3) TABLETS AT NOON, AND (2) TABLETS AT 5PM,   carbidopa-levodopa (SINEMET IR) 25-100 MG tablet 3 at 7am/3 at 11am/2 at 3pm/1 at 7pm   No facility-administered encounter medications on file as of 11/25/2022.    Objective:   PHYSICAL EXAMINATION:    VITALS:   Vitals:   11/25/22 1300  BP: 116/72  Pulse: 62  SpO2: 99%  Weight: 212 lb (96.2 kg)  Height: 6' (1.829 m)    GEN:  The patient appears stated age and is in NAD. HEENT:  Normocephalic, atraumatic.  The mucous membranes are moist.   Neurological examination:  Orientation: The patient is alert and oriented x3. Cranial nerves: There is good facial symmetry with mild facial hypomimia. The speech is fluent and clear. Soft palate rises symmetrically and there is no tongue deviation. Hearing is intact to conversational tone. Sensation: Sensation is intact to light touch throughout Motor: Strength is at least antigravity x4.    Movement examination: Tone: mild increased LUE Abnormal movements: there is actually no tremor today (greatly improved) Coordination:  There is no  decremation with RAM's, with any form of RAMS, including alternating supination and pronation of the forearm, hand opening and closing, finger taps, heel taps and toe taps. Gait and Station: The patient has no difficulty arising out of a deep-seated chair without the use of the hands. The patient's stride length is good today  I have reviewed and interpreted the following labs independently    Chemistry      Component Value Date/Time   NA 141 06/16/2022 1210   K 4.6 06/16/2022 1210   CL 102 06/16/2022 1210   CO2 22 06/16/2022 1210   BUN 15 06/16/2022 1210   CREATININE 0.97 06/16/2022 1210   CREATININE 0.91 01/31/2016 1136      Component Value Date/Time   CALCIUM 10.1 06/16/2022 1210   ALKPHOS 122 (H) 06/16/2022 1210   AST 17 06/16/2022 1210  ALT 17 06/16/2022 1210   BILITOT 0.5 06/16/2022 1210       Lab Results  Component Value Date   WBC 11.4 (H) 06/16/2022   HGB 14.0 06/16/2022   HCT 41.8 06/16/2022   MCV 93 06/16/2022   PLT 270 06/16/2022    Lab Results  Component Value Date   TSH 1.80 08/30/2019     Total time spent on today's visit was 42 minutes, including both face-to-face time and nonface-to-face time.  Time included that spent on review of records (prior notes available to me/labs/imaging if pertinent), discussing treatment and goals, answering patient's questions and coordinating care.  This did not include the time spent waiting for levodopa to kick in.  Cc:  Zenia Resides, MD

## 2022-11-25 NOTE — Patient Instructions (Addendum)
Increase carbidopa/levodopa 25/100, 3 at 7am/3 at 11am/2 at 3pm/1 at 7pm 2.   add carbidopa/levodopa 50/200 CR  at bed.   3.  Continue pramipexole 0.5 mg, 2 at 7am, 2 at 11 am, 1 at 3pm

## 2022-12-16 ENCOUNTER — Encounter: Payer: Self-pay | Admitting: Family Medicine

## 2022-12-29 ENCOUNTER — Telehealth: Payer: Self-pay | Admitting: Family Medicine

## 2022-12-29 ENCOUNTER — Telehealth: Payer: Self-pay | Admitting: Anesthesiology

## 2022-12-29 DIAGNOSIS — G20A1 Parkinson's disease without dyskinesia, without mention of fluctuations: Secondary | ICD-10-CM

## 2022-12-29 MED ORDER — CARBIDOPA-LEVODOPA 25-100 MG PO TABS: ORAL_TABLET | ORAL | 1 refills | Status: DC

## 2022-12-29 NOTE — Telephone Encounter (Signed)
Pt's wife Olegario Shearer called stating pt is taking carbidopa-levodopa 50-200 mg, States since medication changed almost a week ago when he started taking, pt is not doing well, he's been shaking a lot and does not help with his tremors. Requests call back.

## 2022-12-29 NOTE — Telephone Encounter (Signed)
Contacted Christopher Weeks to schedule their annual wellness visit. Appointment made for 01/09/2023.  Thank you,  [place] Direct dial  [phone]

## 2022-12-29 NOTE — Telephone Encounter (Signed)
Patient misunderstood and stopped taking  all the 25/100 and was only taking 50/200 at night. Went over medication and sent in refill for this patient    Increase was the 1 at 7 and the Carbidopa levodopa 50/200 CR   Increase carbidopa/levodopa 25/100, 3 at 7am/3 at 11am/2 at 3pm/1 at 7pm 2.   add carbidopa/levodopa 50/200 CR  at bed.   3.  Continue pramipexole 0.5 mg, 2 at 7am, 2 at 11 am, 1 at 3pm

## 2023-01-08 NOTE — Patient Instructions (Signed)
Health Maintenance, Male Adopting a healthy lifestyle and getting preventive care are important in promoting health and wellness. Ask your health care provider about: The right schedule for you to have regular tests and exams. Things you can do on your own to prevent diseases and keep yourself healthy. What should I know about diet, weight, and exercise? Eat a healthy diet  Eat a diet that includes plenty of vegetables, fruits, low-fat dairy products, and lean protein. Do not eat a lot of foods that are high in solid fats, added sugars, or sodium. Maintain a healthy weight Body mass index (BMI) is a measurement that can be used to identify possible weight problems. It estimates body fat based on height and weight. Your health care provider can help determine your BMI and help you achieve or maintain a healthy weight. Get regular exercise Get regular exercise. This is one of the most important things you can do for your health. Most adults should: Exercise for at least 150 minutes each week. The exercise should increase your heart rate and make you sweat (moderate-intensity exercise). Do strengthening exercises at least twice a week. This is in addition to the moderate-intensity exercise. Spend less time sitting. Even light physical activity can be beneficial. Watch cholesterol and blood lipids Have your blood tested for lipids and cholesterol at 70 years of age, then have this test every 5 years. You may need to have your cholesterol levels checked more often if: Your lipid or cholesterol levels are high. You are older than 70 years of age. You are at high risk for heart disease. What should I know about cancer screening? Many types of cancers can be detected early and may often be prevented. Depending on your health history and family history, you may need to have cancer screening at various ages. This may include screening for: Colorectal cancer. Prostate cancer. Skin cancer. Lung  cancer. What should I know about heart disease, diabetes, and high blood pressure? Blood pressure and heart disease High blood pressure causes heart disease and increases the risk of stroke. This is more likely to develop in people who have high blood pressure readings or are overweight. Talk with your health care provider about your target blood pressure readings. Have your blood pressure checked: Every 3-5 years if you are 18-39 years of age. Every year if you are 40 years old or older. If you are between the ages of 65 and 75 and are a current or former smoker, ask your health care provider if you should have a one-time screening for abdominal aortic aneurysm (AAA). Diabetes Have regular diabetes screenings. This checks your fasting blood sugar level. Have the screening done: Once every three years after age 45 if you are at a normal weight and have a low risk for diabetes. More often and at a younger age if you are overweight or have a high risk for diabetes. What should I know about preventing infection? Hepatitis B If you have a higher risk for hepatitis B, you should be screened for this virus. Talk with your health care provider to find out if you are at risk for hepatitis B infection. Hepatitis C Blood testing is recommended for: Everyone born from 1945 through 1965. Anyone with known risk factors for hepatitis C. Sexually transmitted infections (STIs) You should be screened each year for STIs, including gonorrhea and chlamydia, if: You are sexually active and are younger than 70 years of age. You are older than 70 years of age and your   health care provider tells you that you are at risk for this type of infection. Your sexual activity has changed since you were last screened, and you are at increased risk for chlamydia or gonorrhea. Ask your health care provider if you are at risk. Ask your health care provider about whether you are at high risk for HIV. Your health care provider  may recommend a prescription medicine to help prevent HIV infection. If you choose to take medicine to prevent HIV, you should first get tested for HIV. You should then be tested every 3 months for as long as you are taking the medicine. Follow these instructions at home: Alcohol use Do not drink alcohol if your health care provider tells you not to drink. If you drink alcohol: Limit how much you have to 0-2 drinks a day. Know how much alcohol is in your drink. In the [place] one drink equals one 12 oz bottle of beer (355 mL), one 5 oz glass of wine (148 mL), or one 1 oz glass of hard liquor (44 mL). Lifestyle Do not use any products that contain nicotine or tobacco. These products include cigarettes, chewing tobacco, and vaping devices, such as e-cigarettes. If you need help quitting, ask your health care provider. Do not use street drugs. Do not share needles. Ask your health care provider for help if you need support or information about quitting drugs. General instructions Schedule regular health, dental, and eye exams. Stay current with your vaccines. Tell your health care provider if: You often feel depressed. You have ever been abused or do not feel safe at home. Summary Adopting a healthy lifestyle and getting preventive care are important in promoting health and wellness. Follow your health care provider's instructions about healthy diet, exercising, and getting tested or screened for diseases. Follow your health care provider's instructions on monitoring your cholesterol and blood pressure. This information is not intended to replace advice given to you by your health care provider. Make sure you discuss any questions you have with your health care provider. Document Revised: 03/18/2021 Document Reviewed: 03/18/2021 Elsevier Patient Education  2023 [employer]  

## 2023-01-08 NOTE — Progress Notes (Signed)
I connected with  Christopher Weeks on 01/09/2023 by a audio enabled telemedicine application and verified that I am speaking with the correct person using two identifiers.  Patient Location: Home  Provider Location: Home Office  I discussed the limitations of evaluation and management by telemedicine. The patient expressed understanding and agreed to proceed.   Subjective:   [name] is a 72 y.o. male who presents for Medicare Annual/Subsequent preventive examination.  Review of Systems    Per HPI unless specifically indicated below.  Cardiac Risk Factors include: advanced age (>44mn, >>76women);male gender, Essential Hypertension, CAD, and Dyslipidemia.          Objective:       11/25/2022    1:00 PM 06/16/2022   10:50 AM 05/09/2022   12:46 PM  Vitals with BMI  Height '6\' 0"'$  '5\' 5"'$  '6\' 0"'$   Weight 212 lbs 195 lbs 13 oz 222 lbs  BMI 28.75 [id number] Systolic [id number] Diastolic 72 [id number]  Pulse 62 51 67    There were no vitals filed for this visit. There is no height or weight on file to calculate BMI.     01/09/2023   10:42 AM 11/25/2022    1:01 PM 06/16/2022   10:52 AM 05/09/2022   12:49 PM 03/13/2022    8:49 AM 12/18/2021    9:47 AM 06/07/2021   12:41 PM  Advanced Directives  Does Patient Have a Medical Advance Directive? No No No No No Yes No  Would patient like information on creating a medical advance directive? No - Patient declined          Current Medications (verified) Outpatient Encounter Medications as of 01/09/2023  Medication Sig   amLODipine (NORVASC) 5 MG tablet Take 1 tablet (5 mg total) by mouth daily.   aspirin 81 MG tablet Take 81 mg by mouth daily.   atorvastatin (LIPITOR) 80 MG tablet TAKE (1) TABLET BY MOUTH AT BEDTIME.   Calcium-Vitamin D 600-125 MG-UNIT TABS Take 1 tablet by mouth daily.    carbidopa-levodopa (SINEMET CR) 50-200 MG tablet Take 1 tablet by mouth at bedtime.   carbidopa-levodopa (SINEMET IR) 25-100 MG tablet 3 at 7am/3 at  11am/2 at 3pm/1 at 7pm   citalopram (CELEXA) 40 MG tablet TAKE ONE TABLET BY MOUTH DAILY.   clopidogrel (PLAVIX) 75 MG tablet TAKE 1 TABLET BY MOUTH ONCE A DAY.   esomeprazole (NEXIUM) 20 MG capsule TAKE 1 CAPSULE BY MOUTH EVERY MORNING.   ezetimibe (ZETIA) 10 MG tablet TAKE 1 TABLET BY MOUTH ONCE A DAY.   famotidine (PEPCID) 20 MG tablet TAKE 1 TABLET BY MOUTH 2 TIMES DAILY.   isosorbide mononitrate (IMDUR) 120 MG 24 hr tablet TAKE ONE TABLET BY MOUTH ONCE DAILY.   lisinopril (ZESTRIL) 40 MG tablet Take 1 tablet (40 mg total) by mouth daily.   pramipexole (MIRAPEX) 0.5 MG tablet TAKE (2) TABLETS IN THE MORNING TAKE (2) TABLETS IN THE AFTERNOON THEN TAKE (1) TABLET IN THE EVENING.   nitroGLYCERIN (NITROSTAT) 0.4 MG SL tablet DISSOLVE 1 TABLET SUBLINGUALLY AS NEEDED FOR CHEST PAIN, MAY REPEAT EVERY 5 MINUTES. AFTER 3 CALL 911. (Patient not taking: Reported on 01/09/2023)   No facility-administered encounter medications on file as of 01/09/2023.    Allergies (verified) Codeine   History: Past Medical History:  Diagnosis Date   CAD S/P percutaneous coronary angioplasty 07/13/2012   CAD s/p DES RCA (07/2012 - Promus Element 3.0x32) Cath/ PCI to RI 05/15/2019 (  unable to place stent). residual CAD of CFX- medical Rx   Coronary artery disease of native artery of native heart with stable angina pectoris 05/12/2019   DES PCI (3x30 2 mm Promus) mid RCA; July 2020 PTCA only of proximal Ramus Intermedius   Dyslipidemia, goal LDL below 70 01/07/2007   Dysrhythmia    Essential hypertension, benign 03/14/2009   Has both true hypertension and white coat hypertension.  Avoid over treatment based on office readings, which run high.     Gastroesophageal reflux disease without esophagitis 01/24/2015   Heart murmur    Hematuria 12/24/2017   2 episodes of gross hematuria without infection   History of colon polyps    History of kidney stones    Impotence of organic origin 01/07/2007   Major depressive  disorder 07/11/2015   Mild cognitive impairment of uncertain or unknown etiology 03/28/2022   Myocardial infarction    Parkinson's disease 01/14/2012   Tremor began Jan/Feb 2013 and seems to be progressive Update 04/13/12  Seen by neuro and diagnosed with Parkinson's Disease.    Pre-diabetes 08/06/2012   A1C with cardiac stent was 6.3   Progressive angina 05/16/2019   Past Surgical History:  Procedure Laterality Date   anal fisher repair     CARDIAC CATHETERIZATION  07/11/2012   high graded stenoses small ift circ and small 1st diag   which were  left for medicl therapy, lv systoilc fx preserved   CORONARY ANGIOPLASTY  07/11/2012   90% and 70% lesions RCA  3.0 x32 mm Promus DES   CORONARY BALLOON ANGIOPLASTY N/A 05/16/2019   Procedure: CORONARY BALLOON ANGIOPLASTY;  Surgeon: Leonie Man, MD;  Location: [facility];  Service: Cardiovascular;  Laterality: N/A;   CORONARY STENT PLACEMENT  07/11/12   EXTRACORPOREAL SHOCK WAVE LITHOTRIPSY Left 04/22/2018   Procedure: LEFT EXTRACORPOREAL SHOCK WAVE LITHOTRIPSY (ESWL);  Surgeon: Cleon Gustin, MD;  Location: [facility];  Service: Urology;  Laterality: Left;   EXTRACORPOREAL SHOCK WAVE LITHOTRIPSY Left 05/31/2018   Procedure: LEFT EXTRACORPOREAL SHOCK WAVE LITHOTRIPSY (ESWL);  Surgeon: Alexis Frock, MD;  Location: [facility];  Service: Urology;  Laterality: Left;  [phone] Jed Limerick [id number]   LEFT HEART CATH N/A 07/11/2012   Procedure: LEFT HEART CATH;  Surgeon: Sanda Klein, MD;  Location: [facility];  Service: Cardiovascular;  Laterality: N/A;   LEFT HEART CATH AND CORONARY ANGIOGRAPHY N/A 05/16/2019   Procedure: LEFT HEART CATH AND CORONARY ANGIOGRAPHY;  Surgeon: Leonie Man, MD;  Location: [facility];  Service: Cardiovascular;  Laterality: N/A;   PERCUTANEOUS CORONARY STENT INTERVENTION (PCI-S) Right 07/11/2012   Procedure: PERCUTANEOUS CORONARY STENT INTERVENTION (PCI-S);  Surgeon: Sanda Klein, MD;  Location:  [facility];  Service: Cardiovascular;  Laterality: Right;   Family History  Adopted: Yes  Problem Relation Age of Onset   Cancer Mother        bone; kidney   Cancer Father    Heart attack Father    Social History   Socioeconomic History   Marital status: Married    Spouse name: Vickie   Number of children: 2   Years of education: 12   Highest education level: High school graduate  Occupational History   Occupation: Retired/disabled    Comment: maintenance  Tobacco Use   Smoking status: Former    Packs/day: 1.00    Years: 20.00    Total pack years: 20.00    Types: Cigarettes    Quit date: 11/10/1996  Years since quitting: 26.1   Smokeless tobacco: Never  Vaping Use   Vaping Use: Never used  Substance and Sexual Activity   Alcohol use: No    Alcohol/week: 0.0 standard drinks of alcohol   Drug use: Not Currently    Comment: prior MJ use; not using now, uses CBD oil - 04/21/18   Sexual activity: Yes    Comment: monogamous  Other Topics Concern   Not on file  Social History Narrative   Live with wife 1 level home   Right handed   Caffeine - coffee 1 cup in am and tea with meals   Education - 11th grade   Social Determinants of Health   Financial Resource Strain: Low Risk  (01/09/2023)   Overall Financial Resource Strain (CARDIA)    Difficulty of Paying Living Expenses: Not hard at all  Food Insecurity: No Food Insecurity (01/09/2023)   Hunger Vital Sign    Worried About Running Out of Food in the Last Year: Never true    Ran Out of Food in the Last Year: Never true  Transportation Needs: No Transportation Needs (01/09/2023)   PRAPARE - Hydrologist (Medical): No    Lack of Transportation (Non-Medical): No  Physical Activity: Insufficiently Active (01/09/2023)   Exercise Vital Sign    Days of Exercise per Week: 3 days    Minutes of Exercise per Session: 20 min  Stress: No Stress Concern Present (01/09/2023)   [place]    Feeling of Stress : Not at all  Social Connections: Moderately Isolated (01/09/2023)   Social Connection and Isolation Panel [NHANES]    Frequency of Communication with Friends and Family: Twice a week    Frequency of Social Gatherings with Friends and Family: Once a week    Attends Religious Services: Never    Marine scientist or Organizations: No    Attends Music therapist: Never    Marital Status: Married    Tobacco Counseling Counseling given: No   Clinical Intake:  Pre-visit preparation completed: No  Pain : No/denies pain     Nutritional Status: BMI 25 -29 Overweight Nutritional Risks: Unintentional weight gain Diabetes: No  How often do you need to have someone help you when you read instructions, pamphlets, or other written materials from your doctor or pharmacy?: 1 - Never  Diabetic?no         Activities of Daily Living    01/09/2023   10:20 AM  In your present state of health, do you have any difficulty performing the following activities:  Hearing? 1  Vision? 0  Difficulty concentrating or making decisions? 1  Walking or climbing stairs? 0  Dressing or bathing? 0  Doing errands, shopping? 0    Patient Care Team: McDiarmid, Blane Ohara, MD as PCP - General (Family Medicine) Croitoru, Dani Gobble, MD as PCP - Cardiology (Cardiology) Tat, Eustace Quail, DO as Consulting Physician (Neurology)  Indicate any recent Medical Services you may have received from other than [facility] providers in the past year (date may be approximate).     Assessment:   This is a routine wellness examination for Aldon.   Hearing/Vision screen Admits to some hearing loss. Denies any change to her vision. Wear glasses. Overdue Annual Eye Exam.   Dietary issues and exercise activities discussed: Current Exercise Habits: Home exercise routine, Type of exercise: walking, Time (Minutes): 20, Frequency (Times/Week):  7, Weekly Exercise (Minutes/Week):  140, Intensity: Mild, Exercise limited by: orthopedic condition(s);neurologic condition(s)   Goals Addressed   None   Depression Screen    01/09/2023   10:19 AM 06/16/2022   10:48 AM 04/08/2018    9:57 AM 01/07/2018   10:48 AM 04/08/2017   10:54 AM 06/12/2016    9:38 AM 03/13/2016   10:57 AM  PHQ 2/9 Scores  PHQ - 2 Score 0 2 0 0 0 0 0  PHQ- 9 Score  7         Fall Risk    01/09/2023   10:19 AM 11/25/2022    1:01 PM 06/16/2022   10:52 AM 05/09/2022   12:48 PM 03/13/2022    8:49 AM  Fall Risk   Falls in the past year? 1 0 0 0 0  Number falls in past yr: 1 0 0 0 0  Injury with Fall? 1 0  0 0  Risk for fall due to : Impaired mobility      Follow up Falls evaluation completed Falls evaluation completed Falls evaluation completed      FALL RISK PREVENTION PERTAINING TO THE HOME:  Any stairs in or around the home? Yes  If so, are there any without handrails? No  Home free of loose throw rugs in walkways, pet beds, electrical cords, etc? Yes  Adequate lighting in your home to reduce risk of falls? Yes   ASSISTIVE DEVICES UTILIZED TO PREVENT FALLS:  Life alert? No  Use of a cane, walker or w/c? No  Grab bars in the bathroom? Yes  Shower chair or bench in shower? No  Elevated toilet seat or a handicapped toilet? Yes   TIMED UP AND GO:  Was the test performed?Unable to perform, virtual appointment   Cognitive Function:    01/16/2015    4:30 PM  MMSE - Mini Mental State Exam  Orientation to time 5  Orientation to Place 5  Registration 3  Attention/ Calculation 3  Recall 3  Language- name 2 objects 2  Language- repeat 1  Language- follow 3 step command 3  Language- read & follow direction 1  Write a sentence 1  Copy design 1  Total score 28        01/09/2023   10:21 AM  6CIT Screen  What Year? 0 points  What month? 0 points  What time? 0 points  Count back from 20 0 points  Months in reverse 0 points  Repeat phrase 10 points  Total  Score 10 points    Immunizations Immunization History  Administered Date(s) Administered   Influenza Split 12/19/2011   Influenza Whole 11/29/2009   Influenza,inj,Quad PF,6+ Mos 10/26/2013, 08/23/2014, 08/28/2014, 11/02/2019   Influenza-Unspecified 11/15/2015, 08/14/2016, 08/17/2017, 10/13/2020   Pneumococcal Conjugate-13 01/07/2018   Pneumococcal Polysaccharide-23 06/12/2016   Td 11/10/1996, 03/14/2009   Zoster, Live 08/28/2014    TDAP status: Due, Education has been provided regarding the importance of this vaccine. Advised may receive this vaccine at local pharmacy or Health Dept. Aware to provide a copy of the vaccination record if obtained from local pharmacy or Health Dept. Verbalized acceptance and understanding.  Flu Vaccine status: Due, Education has been provided regarding the importance of this vaccine. Advised may receive this vaccine at local pharmacy or Health Dept. Aware to provide a copy of the vaccination record if obtained from local pharmacy or Health Dept. Verbalized acceptance and understanding.  Pneumococcal vaccine status: Due, Education has been provided regarding the importance of this vaccine. Advised may receive this  vaccine at local pharmacy or Health Dept. Aware to provide a copy of the vaccination record if obtained from local pharmacy or Health Dept. Verbalized acceptance and understanding.  Covid-19 vaccine status: Information provided on how to obtain vaccines.   Qualifies for Shingles Vaccine? Yes   Zostavax completed No   Shingrix Completed?: No.    Education has been provided regarding the importance of this vaccine. Patient has been advised to call insurance company to determine out of pocket expense if they have not yet received this vaccine. Advised may also receive vaccine at local pharmacy or Health Dept. Verbalized acceptance and understanding.  Screening Tests Health Maintenance  Topic Date Due   COVID-19 Vaccine (1) Never done   Zoster  Vaccines- Shingrix (1 of 2) Never done   Fecal DNA (Cologuard)  Never done   DTaP/Tdap/Td (3 - Tdap) 03/15/2019   INFLUENZA VACCINE  06/10/2022   Pneumonia Vaccine 61+ Years old (3 of 3 - PPSV23 or PCV20) 01/07/2023   Medicare Annual Wellness (AWV)  01/09/2024   Hepatitis C Screening  Completed   HPV VACCINES  Aged Out    Health Maintenance  Health Maintenance Due  Topic Date Due   COVID-19 Vaccine (1) Never done   Zoster Vaccines- Shingrix (1 of 2) Never done   Fecal DNA (Cologuard)  Never done   DTaP/Tdap/Td (3 - Tdap) 03/15/2019   INFLUENZA VACCINE  06/10/2022   Pneumonia Vaccine 14+ Years old (3 of 3 - PPSV23 or PCV20) 01/07/2023    Colorectal cancer screening: Referral to GI placed 01/09/2023. Pt aware the office will call re: appt.  Lung Cancer Screening: (Low Dose CT Chest recommended if Age 46-80 years, 30 pack-year currently smoking OR have quit w/in 15years.) does not qualify.   Lung Cancer Screening Referral: not applicable   Additional Screening:  Hepatitis C Screening: does qualify; Completed 01/31/2016  Vision Screening: Recommended annual ophthalmology exams for early detection of glaucoma and other disorders of the eye. Is the patient up to date with their annual eye exam?  No  Who is the provider or what is the name of the office in which the patient attends annual eye exams? Overdue for an Eye exam. The pt will call and schedule an Eye Exam  If pt is not established with a provider, would they like to be referred to a provider to establish care? No .   Dental Screening: Recommended annual dental exams for proper oral hygiene  Community Resource Referral / Chronic Care Management: CRR required this visit?  No   CCM required this visit?  No      Plan:     I have personally reviewed and noted the following in the patient's chart:   Medical and social history Use of alcohol, tobacco or illicit drugs  Current medications and supplements including  opioid prescriptions. Patient is not currently taking opioid prescriptions. Functional ability and status Nutritional status Physical activity Advanced directives List of other physicians Hospitalizations, surgeries, and ER visits in previous 12 months Vitals Screenings to include cognitive, depression, and falls Referrals and appointments  In addition, I have reviewed and discussed with patient certain preventive protocols, quality metrics, and best practice recommendations. A written personalized care plan for preventive services as well as general preventive health recommendations were provided to patient.     Wilson Singer, [place]   01/09/2023  Nurse Notes: Approximately 30 minute Non-Face -To-Face Medicare Wellness Visit

## 2023-01-09 ENCOUNTER — Ambulatory Visit (INDEPENDENT_AMBULATORY_CARE_PROVIDER_SITE_OTHER): Payer: PPO

## 2023-01-09 DIAGNOSIS — Z0000 Encounter for general adult medical examination without abnormal findings: Secondary | ICD-10-CM | POA: Diagnosis not present

## 2023-01-09 DIAGNOSIS — Z1211 Encounter for screening for malignant neoplasm of colon: Secondary | ICD-10-CM

## 2023-01-09 DIAGNOSIS — Z86010 Personal history of colonic polyps: Secondary | ICD-10-CM

## 2023-01-20 ENCOUNTER — Other Ambulatory Visit: Payer: Self-pay | Admitting: Cardiovascular Disease

## 2023-01-20 NOTE — Telephone Encounter (Signed)
This patient is requesting refill on clopidogrel. Based on last encounter note, has pt restarted this medication? Ok to fill?   CAD: CCS functional class II angina on amlodipine and long-acting nitrates.  Beta-blocker intolerant due to bradycardia.  He would have to discontinue aspirin and clopidogrel for the planned deep brain stimulation procedure.  We will check a Lexiscan Myoview to make sure that the ischemic areas are limited to the small distal territories we know from his last angiogram.

## 2023-01-21 NOTE — Telephone Encounter (Signed)
The clopidogrel was only stopped temporarily. Refill both meds.

## 2023-02-17 ENCOUNTER — Other Ambulatory Visit: Payer: Self-pay | Admitting: Neurology

## 2023-02-17 ENCOUNTER — Other Ambulatory Visit: Payer: Self-pay | Admitting: Cardiovascular Disease

## 2023-02-17 DIAGNOSIS — G20A1 Parkinson's disease without dyskinesia, without mention of fluctuations: Secondary | ICD-10-CM

## 2023-03-12 ENCOUNTER — Encounter: Payer: Self-pay | Admitting: Family Medicine

## 2023-03-12 ENCOUNTER — Ambulatory Visit (INDEPENDENT_AMBULATORY_CARE_PROVIDER_SITE_OTHER): Payer: PPO | Admitting: Family Medicine

## 2023-03-12 VITALS — BP 109/64 | HR 54 | Ht 72.0 in | Wt 209.4 lb

## 2023-03-12 DIAGNOSIS — I2510 Atherosclerotic heart disease of native coronary artery without angina pectoris: Secondary | ICD-10-CM

## 2023-03-12 DIAGNOSIS — Z79899 Other long term (current) drug therapy: Secondary | ICD-10-CM

## 2023-03-12 DIAGNOSIS — K219 Gastro-esophageal reflux disease without esophagitis: Secondary | ICD-10-CM

## 2023-03-12 DIAGNOSIS — G20A1 Parkinson's disease without dyskinesia, without mention of fluctuations: Secondary | ICD-10-CM

## 2023-03-12 DIAGNOSIS — Z9861 Coronary angioplasty status: Secondary | ICD-10-CM | POA: Diagnosis not present

## 2023-03-12 DIAGNOSIS — I10 Essential (primary) hypertension: Secondary | ICD-10-CM | POA: Diagnosis not present

## 2023-03-12 DIAGNOSIS — E785 Hyperlipidemia, unspecified: Secondary | ICD-10-CM | POA: Diagnosis not present

## 2023-03-12 NOTE — Patient Instructions (Signed)
Your blood pressure looks very good.  Keep taking your blood pressure medications.   Please ask your pharmacist to give you a Prevnar-20 vaccination. Please ask your pharmacist to give you a Tetanus booster.   We are checking your cholesterol today, along with your kidneys, blood sugar and electrolytes.

## 2023-03-13 ENCOUNTER — Encounter: Payer: Self-pay | Admitting: Family Medicine

## 2023-03-13 LAB — LIPID PANEL
Chol/HDL Ratio: 3.2 ratio (ref 0.0–5.0)
Cholesterol, Total: 116 mg/dL (ref 100–199)
HDL: 36 mg/dL — ABNORMAL LOW (ref >39)
LDL Chol Calc (NIH): 60 mg/dL (ref 0–99)
Triglycerides: 105 mg/dL (ref 0–149)
VLDL Cholesterol Cal: 20 mg/dL (ref 5–40)

## 2023-03-13 LAB — BASIC METABOLIC PANEL
BUN/Creatinine Ratio: 21 (ref 10–24)
BUN: 20 mg/dL (ref 8–27)
CO2: 21 mmol/L (ref 20–29)
Calcium: 9.8 mg/dL (ref 8.6–10.2)
Chloride: 103 mmol/L (ref 96–106)
Creatinine, Ser: 0.94 mg/dL (ref 0.76–1.27)
Glucose: 132 mg/dL — ABNORMAL HIGH (ref 70–99)
Potassium: 4.3 mmol/L (ref 3.5–5.2)
Sodium: 143 mmol/L (ref 134–144)
eGFR: 87 mL/min/{1.73_m2} (ref >59)

## 2023-03-13 NOTE — Assessment & Plan Note (Addendum)
Established problem Well Controlled. Patient is at goal of < 140/90. No signs of complications, medication side effects, or red flags. Continue current medications and other regiments. Comprehensive Metabolic Panel pend If orthostatic symptoms worsen, may have to reduce antihypertensive regiment

## 2023-03-13 NOTE — Progress Notes (Signed)
Westley Zameer Awwad is accompanied by wife Sources of clinical information for visit is/are patient and spouse/SO. Nursing assessment for this office visit was reviewed with the patient for accuracy and revision.     Previous Report(s) Reviewed: Neuro visit note 11/25/22 Dr Tat    03/12/2023    1:37 PM  Depression screen PHQ 2/9  Decreased Interest 1  Down, Depressed, Hopeless 2  PHQ - 2 Score 3  Altered sleeping 3  Tired, decreased energy 3  Change in appetite 2  Feeling bad or failure about yourself  2  Trouble concentrating 1  Moving slowly or fidgety/restless 1  Suicidal thoughts 1  PHQ-9 Score 16  Difficult doing work/chores Not difficult at all   [facility] Office Visit from 03/12/2023 in [facility] Office Visit from 06/16/2022 in [facility]  Thoughts that you would be better off dead, or of hurting yourself in some way Several days Not at all  PHQ-9 Total Score 16 7          03/12/2023    1:38 PM 01/09/2023   10:19 AM 11/25/2022    1:01 PM 06/16/2022   10:52 AM 05/09/2022   12:48 PM  Fall Risk   Falls in the past year? 1 1 0 0 0  Number falls in past yr: 0 1 0 0 0  Injury with Fall? 1 1 0  0  Risk for fall due to :  Impaired mobility     Follow up  Falls evaluation completed Falls evaluation completed Falls evaluation completed        03/12/2023    1:37 PM 01/09/2023   10:19 AM 06/16/2022   10:48 AM  PHQ9 SCORE ONLY  PHQ-9 Total Score 16 0 7    There are no preventive care reminders to display for this patient.  Health Maintenance Due  Topic Date Due   COVID-19 Vaccine (1) Never done   Zoster Vaccines- Shingrix (1 of 2) Never done   Fecal DNA (Cologuard)  Never done   DTaP/Tdap/Td (3 - Tdap) 03/15/2019   Pneumonia Vaccine 65+ Years old (3 of 3 - PPSV23 or PCV20) 01/07/2023      History/P.E. limitations: none  There are no preventive care reminders to display for this patient. There are no preventive care reminders to  display for this patient.  Health Maintenance Due  Topic Date Due   COVID-19 Vaccine (1) Never done   Zoster Vaccines- Shingrix (1 of 2) Never done   Fecal DNA (Cologuard)  Never done   DTaP/Tdap/Td (3 - Tdap) 03/15/2019   Pneumonia Vaccine 1+ Years old (3 of 3 - PPSV23 or PCV20) 01/07/2023     Chief Complaint  Patient presents with   Hypertension     --------------------------------------------------------------------------------------------------------------------------------------------- Visit Problem List with A/P  No problem-specific Assessment & Plan notes found for this encounter.

## 2023-03-13 NOTE — Assessment & Plan Note (Signed)
Adequate symptom control. Tolerating nexium and Pepcid medication. Continue current medication regiment.

## 2023-03-13 NOTE — Assessment & Plan Note (Signed)
Established problem Tolerating atorvastatin 80 mg No signs of complications, medication side effects, or red flags. Lipid panel pend Continue current medications and other regiments.

## 2023-03-13 NOTE — Assessment & Plan Note (Addendum)
Followed by Dr Tat (Neuro) Stable He reports being able to complete his ADLs and iADLs with his current antiparkinson meds He does having wearing off in the morning that is associated with tremor, especially on left side, that prevents him completing several of his morning ADLs until his first morning antiparkinson med(S) dosing. After meds take affect, he is able to complete his moring tasks.  He does have occasional difficulty with nocturnal movement in bed. He does have intermittent dizziness with rising from sitting to standing.   Ne denies constipation, urgency, dysphagia, choreoathetotic movements.

## 2023-03-13 NOTE — Assessment & Plan Note (Signed)
Established problem. Stable. Patient is at goal of cardiac symptom control. No signs of complications, medication side effects, or red flags. Continue current medications and other regiments.

## 2023-04-21 ENCOUNTER — Other Ambulatory Visit: Payer: Self-pay | Admitting: Cardiovascular Disease

## 2023-05-19 ENCOUNTER — Other Ambulatory Visit: Payer: Self-pay | Admitting: Cardiovascular Disease

## 2023-05-19 ENCOUNTER — Other Ambulatory Visit: Payer: Self-pay | Admitting: Neurology

## 2023-05-19 DIAGNOSIS — G20A1 Parkinson's disease without dyskinesia, without mention of fluctuations: Secondary | ICD-10-CM

## 2023-06-01 NOTE — Progress Notes (Unsigned)
Assessment/Plan:   1.  Parkinsons Disease with levodopa resistant resistant tremor  -pt looks much better than last visit!  -continue carbidopa/levodopa 25/100, 3 at 7am/3 at 11am/2 at 3pm/1 at 7pm  -continue carbidopa/levodopa 50/200 at bed for first AM on  -continue Pramipexole, 0.5 mg, 2/2/1 (7am/11am/4pm)  -Levodopa challenge test done May 09, 2022 and patient's tremor responded fairly significantly to levodopa, although not completely.  This was actually a surprise to both of [place].    -Patient had neurocognitive testing and I personally talked to Dr. Milbert Coulter about this.  He stated that, taken at face value, the patient's results showed severe impairment, and we generally would not do DBS in somebody with severe impairment.  However, Dr. Milbert Coulter felt that the patient had panicked during the test and the test anxiety severely influenced of the results of the test.  We discussed this today and ultimately, if we are going to proceed with DBS, I think it may be best to proceed with PET scan to see if there is true cognitive impairment/dementia and to rule out AD versus FTD.  For now, patient has decided to hold on that and be treated medically.  -Discussed some DBS reservations with the patient today, including Dr. Tammy Sours reservations, but also including the fact that the patient's wife called here previously expressing reservations.  I do not generally perform DBS on patients unless the family is agreeable.  I have previously met the patient's children, who are very much on board, but I have actually never met the wife.  After discussion today, I have serious concerns about wife (she hasn't left home in 6 months and is paranoid and I told him he needs to get wife to doc asap).  -will let me know if needs referral to mov't physician in [place] (he may be moving)  2.  Insomnia  -on melatonin  -avoid napping so late and no longer than 1 hour.  -on mirtazapine  3.  Coronary artery disease  -Cardiology  noted patient had cardiac disease, but felt okay to proceed with DBS.  Nonetheless, patient and I decided to hold on that for now.  -his BPs was fairly low in the office.  Not sure if he needs all of the BP meds he is on  4.  RBD  -citalopram may be making it worse - discuss with pcp  -may need to add klonopin in future  Subjective:   Christopher Weeks was seen today in follow up for Parkinsons disease.  My previous records were reviewed prior to todays visit as well as outside records available to me. Pt unaccompanied today.   Last visit, we increased his daytime levodopa and added CR levodopa at bed for first morning on.  He called about a month after we did this, and reported he had only made the change a week earlier, but stated that he was not doing well at all.  It turns out that he stopped taking the 25/100 completely and was only taking the carbidopa/levodopa 50/200 at bedtime.  We gave him the directions again to increase the daytime levodopa and start the bedtime CR levodopa.  He is also on pramipexole.  He reports today that this worked well.  He has no hallucinations or compulsive behaviors.  He is acting out his dreams some.  Stress has been increased as his daughter moved back in, although she is happy she has a job now.   He has fallen going down his stairs.  Didn't get hurt.  No other falls.     Current prescribed movement disorder medications: Carbidopa/levodopa 25/100, 3 at 7am/3 at 11am/2 at 3pm/1 at 7pm(increase last visit) carbidopa/levodopa 50/200 CR at bed (started last visit) Pramipexole 0.5 mg, 2/2/1  mirtazapine Celexa  PREVIOUS MEDICATIONS: Sinemet and Mirapex;  ALLERGIES:   Allergies  Allergen Reactions   Codeine Hives    Denies Airway involvement    CURRENT MEDICATIONS:  Outpatient Encounter Medications as of 06/02/2023  Medication Sig   amLODipine (NORVASC) 5 MG tablet TAKE ONE TABLET BY MOUTH ONCE DAILY.   aspirin 81 MG tablet Take 81 mg by mouth daily.    atorvastatin (LIPITOR) 80 MG tablet TAKE (1) TABLET BY MOUTH AT BEDTIME.   Calcium-Vitamin D 600-125 MG-UNIT TABS Take 1 tablet by mouth daily.    carbidopa-levodopa (SINEMET CR) 50-200 MG tablet Take 1 tablet by mouth at bedtime.   carbidopa-levodopa (SINEMET IR) 25-100 MG tablet 3 at 7am/3 at 11am/2 at 3pm/1 at 7pm   citalopram (CELEXA) 40 MG tablet TAKE ONE TABLET BY MOUTH DAILY.   clopidogrel (PLAVIX) 75 MG tablet TAKE 1 TABLET BY MOUTH ONCE A DAY.   esomeprazole (NEXIUM) 20 MG capsule TAKE 1 CAPSULE BY MOUTH EVERY MORNING.   ezetimibe (ZETIA) 10 MG tablet TAKE 1 TABLET BY MOUTH ONCE A DAY.   famotidine (PEPCID) 20 MG tablet TAKE 1 TABLET BY MOUTH 2 TIMES DAILY.   isosorbide mononitrate (IMDUR) 120 MG 24 hr tablet TAKE ONE TABLET BY MOUTH ONCE DAILY.   lisinopril (ZESTRIL) 40 MG tablet TAKE ONE TABLET BY MOUTH ONCE DAILY.   nitroGLYCERIN (NITROSTAT) 0.4 MG SL tablet DISSOLVE 1 TABLET SUBLINGUALLY AS NEEDED FOR CHEST PAIN, MAY REPEAT EVERY 5 MINUTES. AFTER 3 CALL 911.   pramipexole (MIRAPEX) 0.5 MG tablet TAKE (2) TABLETS IN THE MORNING TAKE (2) TABLETS IN THE AFTERNOON THEN TAKE (1) TABLET IN THE EVENING.   No facility-administered encounter medications on file as of 06/02/2023.    Objective:   PHYSICAL EXAMINATION:    VITALS:   Vitals:   06/02/23 1247  BP: 116/60  Pulse: 72  SpO2: 96%  Weight: 202 lb 12.8 oz (92 kg)  Height: 6' (1.829 m)     GEN:  The patient appears stated age and is in NAD. HEENT:  Normocephalic, atraumatic.  The mucous membranes are moist. CV:  RRR Lungs: CTAB  Neurological examination:  Orientation: The patient is alert and oriented x3. Cranial nerves: There is good facial symmetry with mild facial hypomimia. The speech is fluent and clear. Soft palate rises symmetrically and there is no tongue deviation. Hearing is intact to conversational tone. Sensation: Sensation is intact to light touch throughout Motor: Strength is at least antigravity  x4.   Movement examination: Tone: mild to mod increased LUE Abnormal movements: there is LUE/LLE tremor and more rare RLE rest tremor Coordination:  There is no decremation with RAM's, with any form of RAMS, including alternating supination and pronation of the forearm, hand opening and closing, finger taps, heel taps and toe taps. Gait and Station: The patient has no difficulty arising out of a deep-seated chair without the use of the hands. The patient's stride length is good today  I have reviewed and interpreted the following labs independently    Chemistry      Component Value Date/Time   NA 143 03/12/2023 1725   K 4.3 03/12/2023 1725   CL 103 03/12/2023 1725   CO2 21 03/12/2023 1725   BUN 20 03/12/2023  1725   CREATININE 0.94 03/12/2023 1725   CREATININE 0.91 01/31/2016 1136      Component Value Date/Time   CALCIUM 9.8 03/12/2023 1725   ALKPHOS 122 (H) 06/16/2022 1210   AST 17 06/16/2022 1210   ALT 17 06/16/2022 1210   BILITOT 0.5 06/16/2022 1210       Lab Results  Component Value Date   WBC 11.4 (H) 06/16/2022   HGB 14.0 06/16/2022   HCT 41.8 06/16/2022   MCV 93 06/16/2022   PLT 270 06/16/2022    Lab Results  Component Value Date   TSH 1.80 08/30/2019     Total time spent on today's visit was 30 minutes, including both face-to-face time and nonface-to-face time.  Time included that spent on review of records (prior notes available to me/labs/imaging if pertinent), discussing treatment and goals, answering patient's questions and coordinating care.  This did not include the time spent waiting for levodopa to kick in.  Cc:  Caro Laroche, DO

## 2023-06-02 ENCOUNTER — Ambulatory Visit (INDEPENDENT_AMBULATORY_CARE_PROVIDER_SITE_OTHER): Payer: PPO | Admitting: Neurology

## 2023-06-02 ENCOUNTER — Encounter: Payer: Self-pay | Admitting: Neurology

## 2023-06-02 VITALS — BP 116/60 | HR 72 | Ht 72.0 in | Wt 202.8 lb

## 2023-06-02 DIAGNOSIS — G20A1 Parkinson's disease without dyskinesia, without mention of fluctuations: Secondary | ICD-10-CM | POA: Diagnosis not present

## 2023-06-02 NOTE — Patient Instructions (Addendum)
Citalopram could be making the acting out of the dreams a tad worse, although the Parkinsons Disease is the ultimate source of that.  Ask your PCP about that medication.  SAVE THE DATE!  We are planning a Parkinsons Disease educational symposium at [facility] in [place] on October 11.  More details to come!  If you would like to be added to our email list to get further information, email [email or web address].  We hope to see you there!  The physicians and staff at [facility] are committed to providing excellent care. You may receive a survey requesting feedback about your experience at our office. We strive to receive "very good" responses to the survey questions. If you feel that your experience would prevent you from giving the office a "very good " response, please contact our office to try to remedy the situation. We may be reached at [phone]. Thank you for taking the time out of your busy day to complete the survey.

## 2023-06-18 ENCOUNTER — Other Ambulatory Visit: Payer: Self-pay | Admitting: Neurology

## 2023-06-18 DIAGNOSIS — G20A1 Parkinson's disease without dyskinesia, without mention of fluctuations: Secondary | ICD-10-CM

## 2023-06-22 NOTE — Progress Notes (Signed)
Cardiology Office Note:  . F@  Date:  07/06/2023  ID:  [name], DOB 12-05-1950, MRN [id number] PCP: Caro Laroche, DO  [facility] Providers Cardiologist:  Thurmon Fair, MD    History of Present Illness: .   Evann Azan Faver is a 72 y.o. male  with coronary artery disease (acute inferior MI, drug-eluting 3.0 x32 mm Promus stent to RCA in 2013; high-grade stenoses in small caliber left circumflex and first diagonal arteries, left for medical therapy), hyperlipidemia, hypertension, prediabetes, Parkinson's disease.  Patient last saw Dr. Royann Shivers 03/2022 with ongoing angina with little activity. Biggest problem was Parkinson's and some orthostatic hypotension. No BB due to bradycardia. NST done prior to brain stimulation and was read as high risk b/c of defect in area of known CAD and no reversibility. Normal LVEF.  Patient comes in for f/u. He has to stop mowing his lawn because of chest pain eases with rest. Hasn't used NTG yet. Occurring more frequently.  Dizzy if he bends over and has to stand up. No bleeding problems on plavix and ASA.   ROS:    Studies Reviewed: Marland Kitchen      EKG Interpretation Date/Time:  Monday July 06 2023 11:27:24 EDT Ventricular Rate:  59 PR Interval:  156 QRS Duration:  118 QT Interval:  446 QTC Calculation: 441 R Axis:   -51  Text Interpretation: Normal sinus rhythm (artifact due to parkinson's shaking) Right bundle branch block Left anterior fascicular block   Bifascicular block   Minimal voltage criteria for LVH, may be normal variant ( R in aVL ) Inferior infarct , age undetermined Confirmed by Jacolyn Reedy [phone]) on 07/06/2023 11:45:33 AM  Prior CV Studies: LEFT HEART CATH AND CORONARY ANGIOGRAPHY, LEFT HEART CATH AND CORONARY ANGIOGRAPHY 05/16/2019  Narrative  TARGET LESION Ramus-2 lesion is 80% stenosed.  PTCA: Balloon angioplasty was performed on the 80% stenosis using a BALLOON SAPPHIRE 2.0X15. Unable to pass stent. Post  intervention, there is a 30% residual stenosis.  Ost Cx to Prox Cx lesion is 99% stenosed -stable, plan for medical management  Previously placed Prox RCA to Mid RCA stent (unknown type) is widely patent.  -------------------------------------------------------------  The left ventricular systolic function is normal. The left ventricular ejection fraction is 55-65% by visual estimate.  LV end diastolic pressure is normal.  SUMMARY  Three-vessel CAD: Widely patent mid RCA stent, 95% ostial small AV groove Circumflex (too small for PCI), 80% proximal Ramus Intermedius  Unsuccessful PCI, PTCA only of proximal Ramus Intermedius lesion reducing from 80% to 30%.  Normal LVEF with normal LVEDP but severely elevated systemic pressures.  RECOMMENDATIONS  Because of significant comorbidities, the patient we monitored overnight for post PCI evaluation.  TR band removal per protocol  Run ACT for 2 hours post PCI and then stop  Anticipate discharge tomorrow if stable -> would likely consider Ranexa if an option given his Parkinson's.    Bryan Lemma, MD   GATED SPECT MYO PERF [facility] STRESS 1D 04/03/2022  Narrative   Findings are equivocal. The study is high risk.   No ST deviation was noted.   LV perfusion is abnormal. Defect 1: There is a large defect with severe reduction in uptake present in the apical to basal anterior and anterolateral location(s) that is fixed. There is normal wall motion in the defect area. Consistent with infarction.   Left ventricular function is normal. Nuclear stress EF: 61 %. The left ventricular ejection fraction is normal (55-65%). End diastolic cavity  size is normal. End systolic cavity size is normal.   Prior study available for comparison from 02/12/2016.  Abnormal stress test, but no clear reversibility seen. There is a severe fixed defect at the basal and mid anterior/anterolateral wall. There are low counts overall. Given decreased uptake, wall  motion incompletely seen but appears grossly normal. There are overall low counts to the study. Cannot determine if this is artifact vs. Infarction based on images.   ECHO COMPLETE WO IMAGING ENHANCING AGENT 10/23/2020  Narrative ECHOCARDIOGRAM REPORT    Patient Name:   SHERLEY VIVO Date of Exam: 10/23/2020 Medical Rec #:  [id number]        Height:       72.0 in Accession #:    [id number]       Weight:       209.2 lb Date of Birth:  1951-07-02        BSA:          2.172 m Patient Age:    69 years         BP:           126/64 mmHg Patient Gender: M                HR:           58 bpm. [facility]  Procedure: 2D Echo, Cardiac Doppler and Color Doppler  Indications:    I25.118 (ICD-10-CM) CAD  History:        Patient has no prior history of Echocardiogram examinations. Previous Myocardial Infarction; Risk Factors:Hypertension and Dyslipidemia. S/P percutaneous coronary angioplasty, Parkinson's disease, Pre-diabetes.  Sonographer:    Celesta Gentile RCS Referring Phys: [id number] JESSE M CLEAVER  IMPRESSIONS   1. Left ventricular ejection fraction, by estimation, is 70 to 75%. The left ventricle has hyperdynamic function. The left ventricle has no regional wall motion abnormalities. There is mild left ventricular hypertrophy. Left ventricular diastolic parameters were normal. 2. Right ventricular systolic function is normal. The right ventricular size is normal. 3. Left atrial size was mildly dilated. 4. The mitral valve is normal in structure. No evidence of mitral valve regurgitation. No evidence of mitral stenosis. 5. The aortic valve has an indeterminant number of cusps. Aortic valve regurgitation is not visualized. No aortic stenosis is present.  FINDINGS Left Ventricle: Left ventricular ejection fraction, by estimation, is 70 to 75%. The left ventricle has hyperdynamic function. The left ventricle has no regional wall motion abnormalities. The left ventricular  internal cavity size was normal in size. There is mild left ventricular hypertrophy. Left ventricular diastolic parameters were normal.  Right Ventricle: The right ventricular size is normal. No increase in right ventricular wall thickness. Right ventricular systolic function is normal.  Left Atrium: Left atrial size was mildly dilated.  Right Atrium: Right atrial size was normal in size.  Pericardium: There is no evidence of pericardial effusion.  Mitral Valve: The mitral valve is normal in structure. No evidence of mitral valve regurgitation. No evidence of mitral valve stenosis.  Tricuspid Valve: The tricuspid valve is normal in structure. Tricuspid valve regurgitation is not demonstrated. No evidence of tricuspid stenosis.  Aortic Valve: The aortic valve has an indeterminant number of cusps. Aortic valve regurgitation is not visualized. No aortic stenosis is present. Aortic valve mean gradient measures 8.0 mmHg. Aortic valve peak gradient measures 16.2 mmHg. Aortic valve area, by VTI measures 2.52 cm.  Pulmonic Valve: The pulmonic valve was not well visualized. Pulmonic valve regurgitation  is not visualized. No evidence of pulmonic stenosis.  Aorta: The aortic root is normal in size and structure.  IAS/Shunts: No atrial level shunt detected by color flow Doppler.   LEFT VENTRICLE PLAX 2D LVIDd:         4.90 cm  Diastology LVIDs:         2.50 cm  LV e' medial:    7.29 cm/s LV PW:         1.10 cm  LV E/e' medial:  11.6 LV IVS:        1.00 cm  LV e' lateral:   14.70 cm/s LVOT diam:     2.10 cm  LV E/e' lateral: 5.8 LV SV:         98 LV SV Index:   45 LVOT Area:     3.46 cm   RIGHT VENTRICLE RV S prime:     18.20 cm/s TAPSE (M-mode): 2.5 cm  LEFT ATRIUM             Index       RIGHT ATRIUM           Index LA diam:        4.00 cm 1.84 cm/m  RA Area:     19.20 cm LA Vol (A2C):   63.9 ml 29.42 ml/m RA Volume:   51.60 ml  23.76 ml/m LA Vol (A4C):   71.6 ml 32.97  ml/m LA Biplane Vol: 68.8 ml 31.68 ml/m AORTIC VALVE AV Area (Vmax):    2.39 cm AV Area (Vmean):   2.70 cm AV Area (VTI):     2.52 cm AV Vmax:           201.00 cm/s AV Vmean:          126.000 cm/s AV VTI:            0.389 m AV Peak Grad:      16.2 mmHg AV Mean Grad:      8.0 mmHg LVOT Vmax:         138.49 cm/s LVOT Vmean:        98.136 cm/s LVOT VTI:          0.283 m LVOT/AV VTI ratio: 0.73  AORTA Ao Root diam: 3.20 cm  MITRAL VALVE MV Area (PHT): 2.90 cm    SHUNTS MV Decel Time: 262 msec    Systemic VTI:  0.28 m MV E velocity: 84.70 cm/s  Systemic Diam: 2.10 cm MV A velocity: 72.30 cm/s MV E/A ratio:  1.17  Dina Rich MD Electronically signed by Dina Rich MD Signature Date/Time: 10/23/2020/12:10:13 PM    Final       Risk Assessment/Calculations:             Physical Exam:   VS:  BP 128/70   Pulse (!) 59   Ht 6' (1.829 m)   Wt 203 lb (92.1 kg)   SpO2 98%   BMI 27.53 kg/m    Wt Readings from Last 3 Encounters:  07/06/23 203 lb (92.1 kg)  06/02/23 202 lb 12.8 oz (92 kg)  03/12/23 209 lb 6 oz (95 kg)    GEN: Well nourished, well developed in no acute distress NECK: No JVD; No carotid bruits CARDIAC:  RRR, 1/6 systolic murmur LSB RESPIRATORY:  Clear to auscultation without rales, wheezing or rhonchi  ABDOMEN: Soft, non-tender, non-distended EXTREMITIES:  No edema; No deformity   ASSESSMENT AND PLAN: .   CAD: on amlodipine, lisinopril  and Imdur.  Beta-blocker  intolerant due to bradycardia. Lexiscan Myoview see above for detail. Continue medical therapy. Try low dose ranexa to see if this helps. Bring back in 1 week for an EKG  HTN: Well-controlled, has some symptoms of orthostatic hypotension but no falls or syncope.  HLP:LDL 60 in May on Zetia and lipitor  PreDM: Most recent hemoglobin A1c was 6.0% but this has not been checked recently-to see a new PCP in a month and will need checked.         Dispo: f/u in 1 yr    Signed, Jacolyn Reedy, PA-C

## 2023-06-25 ENCOUNTER — Encounter: Payer: Self-pay | Admitting: Pharmacist

## 2023-06-25 NOTE — Progress Notes (Signed)
Reviewed chart for adherence for [facility] measure. Returned appears adherent per Dr. Tiajuana Amass 06/18/23.

## 2023-07-06 ENCOUNTER — Encounter: Payer: Self-pay | Admitting: Physician Assistant

## 2023-07-06 ENCOUNTER — Ambulatory Visit: Payer: PPO | Attending: Physician Assistant | Admitting: Physician Assistant

## 2023-07-06 VITALS — BP 128/70 | HR 59 | Ht 72.0 in | Wt 203.0 lb

## 2023-07-06 DIAGNOSIS — I10 Essential (primary) hypertension: Secondary | ICD-10-CM

## 2023-07-06 DIAGNOSIS — E7849 Other hyperlipidemia: Secondary | ICD-10-CM

## 2023-07-06 DIAGNOSIS — I25118 Atherosclerotic heart disease of native coronary artery with other forms of angina pectoris: Secondary | ICD-10-CM | POA: Diagnosis not present

## 2023-07-06 DIAGNOSIS — R7303 Prediabetes: Secondary | ICD-10-CM

## 2023-07-06 MED ORDER — RANOLAZINE ER 500 MG PO TB12: 500.0000 mg | ORAL_TABLET | Freq: Two times a day (BID) | ORAL | 11 refills | Status: DC

## 2023-07-06 NOTE — Patient Instructions (Signed)
Medication Instructions:   START Ranexa 500 mg twice a day   Labwork: None today  Testing/Procedures: None today  Follow-Up: 1 week with nurse for EKG  1 year with Dr.Mallipeddi  Any Other Special Instructions Will Be Listed Below (If Applicable).  If you need a refill on your cardiac medications before your next appointment, please call your pharmacy.

## 2023-07-14 ENCOUNTER — Ambulatory Visit: Payer: PPO | Attending: Cardiology

## 2023-07-14 VITALS — BP 118/71 | HR 48

## 2023-07-14 DIAGNOSIS — Z79899 Other long term (current) drug therapy: Secondary | ICD-10-CM | POA: Diagnosis not present

## 2023-07-14 NOTE — Progress Notes (Signed)
Nurse visit for repeat EKG as patients one from 07/06/23 had a lot of noise from shaking as he has Parkinson's disease   Patient states the ranexa has helped his cp but he feels dizzy after taking it. He was taking at 11 am and 7 pm which is only 8 hours apart.I asked him to take it 12 hours apart at 7 am and 7 pm. He is going to monitor his bp at home.    I will forward to M.Lenze,PA-C

## 2023-07-15 ENCOUNTER — Telehealth: Payer: Self-pay

## 2023-07-15 NOTE — Telephone Encounter (Signed)
Apt made for 10/16 at 1140 with dr.mallipeddi   Left message to return call

## 2023-07-15 NOTE — Telephone Encounter (Signed)
-----   Message from Jacolyn Reedy sent at 07/15/2023  8:27 AM EDT ----- EKG shows sinus bradycardia 48/m. Notes say ranexa helped his chest pain but he's having dizziness. If he continues to be dizzy taking ranexa 12 hrs apart we will need to stop ranexa. Please get him in for f/u with Dr. Jenene Slicker next available.

## 2023-07-17 ENCOUNTER — Other Ambulatory Visit: Payer: Self-pay | Admitting: Neurology

## 2023-07-17 ENCOUNTER — Other Ambulatory Visit: Payer: Self-pay | Admitting: Cardiovascular Disease

## 2023-07-20 ENCOUNTER — Other Ambulatory Visit: Payer: Self-pay | Admitting: Family Medicine

## 2023-07-21 ENCOUNTER — Other Ambulatory Visit: Payer: Self-pay | Admitting: Family Medicine

## 2023-07-21 NOTE — Telephone Encounter (Signed)
Provider not at this practice.  Requested Prescriptions  Pending Prescriptions Disp Refills   famotidine (PEPCID) 20 MG tablet [Pharmacy Med Name: FAMOTIDINE 20 MG TABLET] 60 tablet 0    Sig: TAKE 1 TABLET BY MOUTH 2 TIMES DAILY.     There is no refill protocol information for this order     esomeprazole (NEXIUM) 20 MG capsule [Pharmacy Med Name: ESOMEPRAZOLE MAG DR 20 MG CAP] 30 capsule 0    Sig: TAKE 1 CAPSULE BY MOUTH EVERY MORNING.     There is no refill protocol information for this order

## 2023-08-03 ENCOUNTER — Ambulatory Visit (INDEPENDENT_AMBULATORY_CARE_PROVIDER_SITE_OTHER): Payer: PPO | Admitting: Family Medicine

## 2023-08-03 ENCOUNTER — Encounter: Payer: Self-pay | Admitting: Family Medicine

## 2023-08-03 VITALS — BP 121/63 | HR 97 | Ht 72.0 in | Wt 202.8 lb

## 2023-08-03 DIAGNOSIS — Z1211 Encounter for screening for malignant neoplasm of colon: Secondary | ICD-10-CM

## 2023-08-03 DIAGNOSIS — Z23 Encounter for immunization: Secondary | ICD-10-CM

## 2023-08-03 DIAGNOSIS — I25118 Atherosclerotic heart disease of native coronary artery with other forms of angina pectoris: Secondary | ICD-10-CM | POA: Diagnosis not present

## 2023-08-03 DIAGNOSIS — G20A1 Parkinson's disease without dyskinesia, without mention of fluctuations: Secondary | ICD-10-CM | POA: Diagnosis not present

## 2023-08-03 DIAGNOSIS — I10 Essential (primary) hypertension: Secondary | ICD-10-CM

## 2023-08-03 DIAGNOSIS — F321 Major depressive disorder, single episode, moderate: Secondary | ICD-10-CM | POA: Diagnosis not present

## 2023-08-03 NOTE — Patient Instructions (Signed)
It was great to see you!  Our plans for today:  - Let your heart doctor know about your ranexa.  - We ordered cologuard for you.   Take care and seek immediate care sooner if you develop any concerns.   Dr. Linwood Dibbles

## 2023-08-03 NOTE — Progress Notes (Unsigned)
   SUBJECTIVE:   CHIEF COMPLAINT / HPI:   Hypertension, CAD s/p PCI: - Medications: imdur, lisinopril, norvasc, plavix, ASA, lipitor, NTG prn, zetia, ranexa - follows with cardiology - Compliance: good. Feels ranexa continuing to make him dizzy, has 2 days of pills left.  - Checking BP at home: no - has not had to take NTG  Depression - celexa, doing well.   Parkinsons - follows with neuro. Compliant with sinemet, pramipexole for RLS.  HM - due for colon cancer screening, covid vaccine, flu vaccine, Tdap, PCV20.    OBJECTIVE:   BP 121/63   Pulse 97   Ht 6' (1.829 m)   Wt 202 lb 12.8 oz (92 kg)   SpO2 97%   BMI 27.50 kg/m   Gen: well appearing, in NAD Card: RRR Lungs: CTAB Ext: WWP, no edema   ASSESSMENT/PLAN:   Coronary artery disease of native artery of native heart with stable angina pectoris Currently asymptomatic. Per recent cardiology note, likely d/c ranexa if still symptomatic after dose decrease. Instructed to call cardiology regarding symptoms.   Essential hypertension, benign At goal, no changes. Continue to follow with cardiology.  Parkinson's disease Stable. Continue current meds. Continue to follow with neuro.   Major depressive disorder Doing well on current regimen, no changes made today.   HM - flu shot given. Will obtain records of shingles vaccine. Cologuard ordered.  Caro Laroche, DO

## 2023-08-04 NOTE — Assessment & Plan Note (Signed)
At goal, no changes. Continue to follow with cardiology.

## 2023-08-04 NOTE — Assessment & Plan Note (Signed)
Doing well on current regimen, no changes made today. 

## 2023-08-04 NOTE — Assessment & Plan Note (Signed)
Currently asymptomatic. Per recent cardiology note, likely d/c ranexa if still symptomatic after dose decrease. Instructed to call cardiology regarding symptoms.

## 2023-08-04 NOTE — Assessment & Plan Note (Signed)
Stable. Continue current meds. Continue to follow with neuro.

## 2023-08-14 ENCOUNTER — Other Ambulatory Visit: Payer: Self-pay | Admitting: Family Medicine

## 2023-08-14 DIAGNOSIS — Z1211 Encounter for screening for malignant neoplasm of colon: Secondary | ICD-10-CM

## 2023-08-14 DIAGNOSIS — Z1212 Encounter for screening for malignant neoplasm of rectum: Secondary | ICD-10-CM

## 2023-08-17 ENCOUNTER — Other Ambulatory Visit: Payer: Self-pay | Admitting: Neurology

## 2023-08-17 DIAGNOSIS — G20A1 Parkinson's disease without dyskinesia, without mention of fluctuations: Secondary | ICD-10-CM

## 2023-08-26 ENCOUNTER — Encounter: Payer: Self-pay | Admitting: Internal Medicine

## 2023-08-26 ENCOUNTER — Ambulatory Visit: Payer: PPO | Attending: Physician Assistant | Admitting: Internal Medicine

## 2023-08-26 NOTE — Progress Notes (Signed)
Erroneous encounter - please disregard.

## 2023-08-31 ENCOUNTER — Ambulatory Visit: Payer: PPO | Admitting: Physician Assistant

## 2023-09-06 ENCOUNTER — Other Ambulatory Visit: Payer: Self-pay | Admitting: Neurology

## 2023-09-06 DIAGNOSIS — G20A1 Parkinson's disease without dyskinesia, without mention of fluctuations: Secondary | ICD-10-CM

## 2023-10-05 ENCOUNTER — Other Ambulatory Visit: Payer: Self-pay | Admitting: Neurology

## 2023-10-05 DIAGNOSIS — G20A1 Parkinson's disease without dyskinesia, without mention of fluctuations: Secondary | ICD-10-CM

## 2023-10-13 NOTE — Progress Notes (Signed)
Cardiology Office Note:  .   Date:  10/26/2023  ID:  [name], DOB 05-31-1951, MRN [id number] PCP: Caro Laroche, DO  [facility] Providers Cardiologist:  Thurmon Fair, MD    History of Present Illness: .   Christopher Weeks is a 72 y.o. male   with coronary artery disease (acute inferior MI, drug-eluting 3.0 x32 mm Promus stent to RCA in 2013; high-grade stenoses in small caliber left circumflex and first diagonal arteries, left for medical therapy), hyperlipidemia, hypertension, prediabetes, Parkinson's disease.   Patient last saw Dr. Royann Shivers 03/2022 with ongoing angina with little activity. Biggest problem was Parkinson's and some orthostatic hypotension. No BB due to bradycardia. NST done prior to brain stimulation and was read as high risk b/c of defect in area of known CAD and no reversibility. Normal LVEF.  I saw the patient 06/2023 with chest pain while mowing the lawn.I added ranexa.F/u EKG sinus brady 48/m RBBB but he was having dizziness. We asked him to take it 12 hrs apart to see if this helped.  Patient says when he mows the yard or does a lot of bending over or strenuous work it feels like something is sitting on his chest. He sits down and it eases in 30 min. Occurs every other day. He does have dizziness if he bends over and gets up quickly. Not sleeping well. His Parkinson's gets worse at night and the shaking keeps him up all night.   ROS:    Studies Reviewed: Marland Kitchen         Prior CV Studies:   Prior CV Studies: LEFT HEART CATH AND CORONARY ANGIOGRAPHY, LEFT HEART CATH AND CORONARY ANGIOGRAPHY 05/16/2019   Narrative  TARGET LESION Ramus-2 lesion is 80% stenosed.  PTCA: Balloon angioplasty was performed on the 80% stenosis using a BALLOON SAPPHIRE 2.0X15. Unable to pass stent. Post intervention, there is a 30% residual stenosis.  Ost Cx to Prox Cx lesion is 99% stenosed -stable, plan for medical management  Previously placed Prox RCA to Mid RCA  stent (unknown type) is widely patent.  -------------------------------------------------------------  The left ventricular systolic function is normal. The left ventricular ejection fraction is 55-65% by visual estimate.  LV end diastolic pressure is normal.   SUMMARY  Three-vessel CAD: Widely patent mid RCA stent, 95% ostial small AV groove Circumflex (too small for PCI), 80% proximal Ramus Intermedius  Unsuccessful PCI, PTCA only of proximal Ramus Intermedius lesion reducing from 80% to 30%.  Normal LVEF with normal LVEDP but severely elevated systemic pressures.   RECOMMENDATIONS  Because of significant comorbidities, the patient we monitored overnight for post PCI evaluation.  TR band removal per protocol  Run ACT for 2 hours post PCI and then stop  Anticipate discharge tomorrow if stable -> would likely consider Ranexa if an option given his Parkinson's.       Bryan Lemma, MD   GATED SPECT MYO PERF [facility] STRESS 1D 04/03/2022   Narrative   Findings are equivocal. The study is high risk.   No ST deviation was noted.   LV perfusion is abnormal. Defect 1: There is a large defect with severe reduction in uptake present in the apical to basal anterior and anterolateral location(s) that is fixed. There is normal wall motion in the defect area. Consistent with infarction.   Left ventricular function is normal. Nuclear stress EF: 61 %. The left ventricular ejection fraction is normal (55-65%). End diastolic cavity size is normal. End systolic cavity size is  normal.   Prior study available for comparison from 02/12/2016.   Abnormal stress test, but no clear reversibility seen. There is a severe fixed defect at the basal and mid anterior/anterolateral wall. There are low counts overall. Given decreased uptake, wall motion incompletely seen but appears grossly normal. There are overall low counts to the study. Cannot determine if this is artifact vs. Infarction based on images.    ECHO COMPLETE WO IMAGING ENHANCING AGENT 10/23/2020   Narrative ECHOCARDIOGRAM REPORT       Patient Name:   Christopher Weeks Date of Exam: 10/23/2020 Medical Rec #:  [id number]        Height:       72.0 in Accession #:    [id number]       Weight:       209.2 lb Date of Birth:  1951/04/01        BSA:          2.172 m Patient Age:    69 years         BP:           126/64 mmHg Patient Gender: M                HR:           58 bpm. [facility]   Procedure: 2D Echo, Cardiac Doppler and Color Doppler   Indications:    I25.118 (ICD-10-CM) CAD   History:        Patient has no prior history of Echocardiogram examinations. Previous Myocardial Infarction; Risk Factors:Hypertension and Dyslipidemia. S/P percutaneous coronary angioplasty, Parkinson's disease, Pre-diabetes.   Sonographer:    Celesta Gentile RCS Referring Phys: [id number] JESSE M CLEAVER   IMPRESSIONS     1. Left ventricular ejection fraction, by estimation, is 70 to 75%. The left ventricle has hyperdynamic function. The left ventricle has no regional wall motion abnormalities. There is mild left ventricular hypertrophy. Left ventricular diastolic parameters were normal. 2. Right ventricular systolic function is normal. The right ventricular size is normal. 3. Left atrial size was mildly dilated. 4. The mitral valve is normal in structure. No evidence of mitral valve regurgitation. No evidence of mitral stenosis. 5. The aortic valve has an indeterminant number of cusps. Aortic valve regurgitation is not visualized. No aortic stenosis is present.   FINDINGS Left Ventricle: Left ventricular ejection fraction, by estimation, is 70 to 75%. The left ventricle has hyperdynamic function. The left ventricle has no regional wall motion abnormalities. The left ventricular internal cavity size was normal in size. There is mild left ventricular hypertrophy. Left ventricular diastolic parameters were normal.   Right  Ventricle: The right ventricular size is normal. No increase in right ventricular wall thickness. Right ventricular systolic function is normal.   Left Atrium: Left atrial size was mildly dilated.   Right Atrium: Right atrial size was normal in size.   Pericardium: There is no evidence of pericardial effusion.   Mitral Valve: The mitral valve is normal in structure. No evidence of mitral valve regurgitation. No evidence of mitral valve stenosis.   Tricuspid Valve: The tricuspid valve is normal in structure. Tricuspid valve regurgitation is not demonstrated. No evidence of tricuspid stenosis.   Aortic Valve: The aortic valve has an indeterminant number of cusps. Aortic valve regurgitation is not visualized. No aortic stenosis is present. Aortic valve mean gradient measures 8.0 mmHg. Aortic valve peak gradient measures 16.2 mmHg. Aortic valve area, by VTI measures 2.52 cm.  Pulmonic Valve: The pulmonic valve was not well visualized. Pulmonic valve regurgitation is not visualized. No evidence of pulmonic stenosis.   Aorta: The aortic root is normal in size and structure.   IAS/Shunts: No atrial level shunt detected by color flow Doppler.     LEFT VENTRICLE PLAX 2D LVIDd:         4.90 cm  Diastology LVIDs:         2.50 cm  LV e' medial:    7.29 cm/s LV PW:         1.10 cm  LV E/e' medial:  11.6 LV IVS:        1.00 cm  LV e' lateral:   14.70 cm/s LVOT diam:     2.10 cm  LV E/e' lateral: 5.8 LV SV:         98 LV SV Index:   45 LVOT Area:     3.46 cm     RIGHT VENTRICLE RV S prime:     18.20 cm/s TAPSE (M-mode): 2.5 cm   LEFT ATRIUM             Index       RIGHT ATRIUM           Index LA diam:        4.00 cm 1.84 cm/m  RA Area:     19.20 cm LA Vol (A2C):   63.9 ml 29.42 ml/m RA Volume:   51.60 ml  23.76 ml/m LA Vol (A4C):   71.6 ml 32.97 ml/m LA Biplane Vol: 68.8 ml 31.68 ml/m AORTIC VALVE AV Area (Vmax):    2.39 cm AV Area (Vmean):   2.70 cm AV Area (VTI):     2.52  cm AV Vmax:           201.00 cm/s AV Vmean:          126.000 cm/s AV VTI:            0.389 m AV Peak Grad:      16.2 mmHg AV Mean Grad:      8.0 mmHg LVOT Vmax:         138.49 cm/s LVOT Vmean:        98.136 cm/s LVOT VTI:          0.283 m LVOT/AV VTI ratio: 0.73   AORTA Ao Root diam: 3.20 cm   MITRAL VALVE MV Area (PHT): 2.90 cm    SHUNTS MV Decel Time: 262 msec    Systemic VTI:  0.28 m MV E velocity: 84.70 cm/s  Systemic Diam: 2.10 cm MV A velocity: 72.30 cm/s MV E/A ratio:  1.17   Dina Rich MD Electronically signed by Dina Rich MD Signature Date/Time: 10/23/2020/12:10:13 PM       Final  Risk Assessment/Calculations:             Physical Exam:   VS:  BP 120/78 (BP Location: Left Arm)   Pulse (!) 50   Ht 6' (1.829 m)   Wt 209 lb 12.8 oz (95.2 kg)   SpO2 97%   BMI 28.45 kg/m    Wt Readings from Last 3 Encounters:  10/26/23 209 lb 12.8 oz (95.2 kg)  08/03/23 202 lb 12.8 oz (92 kg)  07/06/23 203 lb (92.1 kg)    GEN: Well nourished, well developed in no acute distress NECK: No JVD; No carotid bruits CARDIAC:  RRR, no murmurs, rubs, gallops RESPIRATORY:  Clear to auscultation without rales, wheezing or rhonchi  ABDOMEN:  Soft, non-tender, non-distended EXTREMITIES:  No edema; No deformity   ASSESSMENT AND PLAN: .   Chest pain/CAD: on amlodipine, lisinopril  and Imdur & ranexa.  Beta-blocker intolerant due to bradycardia. Lexiscan Myoview last year at ischemia in region where he has known blockage. His chest pain has become more frequent in the past month. Will repeat lexiscan with early f/u. Can't increase meds due to low BP's and dizziness. Check CBC and CMET today   HTN: Well-controlled, has some symptoms of orthostatic hypotension but no falls or syncope. Not orthostatic on exam today.Check labs today   HLD:LDL 60 in May on Zetia and lipitor   PreDM: Most recent hemoglobin A1c was 6.1%    Parkinson's-shaking tremors have worsened and keep him  up all night. He has an appt for f/u.     Informed Consent   Shared Decision Making/Informed Consent The risks [chest pain, shortness of breath, cardiac arrhythmias, dizziness, blood pressure fluctuations, myocardial infarction, stroke/transient ischemic attack, nausea, vomiting, allergic reaction, radiation exposure, metallic taste sensation and life-threatening complications (estimated to be 1 in 10,000)], benefits (risk stratification, diagnosing coronary artery disease, treatment guidance) and alternatives of a nuclear stress test were discussed in detail with Mr. Hardwell and he agrees to proceed.     Dispo: f/u with Dr. Jenene Slicker after stress test.  Signed, Jacolyn Reedy, PA-C

## 2023-10-26 ENCOUNTER — Ambulatory Visit: Payer: PPO | Attending: Physician Assistant | Admitting: Physician Assistant

## 2023-10-26 ENCOUNTER — Encounter: Payer: Self-pay | Admitting: Physician Assistant

## 2023-10-26 ENCOUNTER — Encounter: Payer: Self-pay | Admitting: *Deleted

## 2023-10-26 ENCOUNTER — Other Ambulatory Visit (HOSPITAL_COMMUNITY)
Admission: RE | Admit: 2023-10-26 | Discharge: 2023-10-26 | Disposition: A | Discharge status: Home / Self Care | Payer: PPO | Source: Ambulatory Visit | Attending: Physician Assistant | Admitting: Physician Assistant

## 2023-10-26 VITALS — BP 120/78 | HR 50 | Ht 72.0 in | Wt 209.8 lb

## 2023-10-26 DIAGNOSIS — E7849 Other hyperlipidemia: Secondary | ICD-10-CM | POA: Diagnosis not present

## 2023-10-26 DIAGNOSIS — I25118 Atherosclerotic heart disease of native coronary artery with other forms of angina pectoris: Secondary | ICD-10-CM

## 2023-10-26 DIAGNOSIS — R7303 Prediabetes: Secondary | ICD-10-CM

## 2023-10-26 DIAGNOSIS — R079 Chest pain, unspecified: Secondary | ICD-10-CM

## 2023-10-26 DIAGNOSIS — G20A1 Parkinson's disease without dyskinesia, without mention of fluctuations: Secondary | ICD-10-CM | POA: Diagnosis not present

## 2023-10-26 DIAGNOSIS — I10 Essential (primary) hypertension: Secondary | ICD-10-CM | POA: Insufficient documentation

## 2023-10-26 LAB — COMPREHENSIVE METABOLIC PANEL
ALT: 7 U/L (ref 0–44)
AST: 19 U/L (ref 15–41)
Albumin: 4.1 g/dL (ref 3.5–5.0)
Alkaline Phosphatase: 78 U/L (ref 38–126)
Anion gap: 8 (ref 5–15)
BUN: 23 mg/dL (ref 8–23)
CO2: 26 mmol/L (ref 22–32)
Calcium: 9.5 mg/dL (ref 8.9–10.3)
Chloride: 103 mmol/L (ref 98–111)
Creatinine, Ser: 0.93 mg/dL (ref 0.61–1.24)
GFR, Estimated: >60 mL/min (ref >60)
Glucose, Bld: 114 mg/dL — ABNORMAL HIGH (ref 70–99)
Potassium: 4.1 mmol/L (ref 3.5–5.1)
Sodium: 137 mmol/L (ref 135–145)
Total Bilirubin: 0.9 mg/dL (ref <1.2)
Total Protein: 7.3 g/dL (ref 6.5–8.1)

## 2023-10-26 LAB — CBC
HCT: 35.8 % — ABNORMAL LOW (ref 39.0–52.0)
Hemoglobin: 12.1 g/dL — ABNORMAL LOW (ref 13.0–17.0)
MCH: 33.4 pg (ref 26.0–34.0)
MCHC: 33.8 g/dL (ref 30.0–36.0)
MCV: 98.9 fL (ref 80.0–100.0)
Platelets: 217 10*3/uL (ref 150–400)
RBC: 3.62 MIL/uL — ABNORMAL LOW (ref 4.22–5.81)
RDW: 13.5 % (ref 11.5–15.5)
WBC: 8.7 10*3/uL (ref 4.0–10.5)
nRBC: 0 % (ref 0.0–0.2)

## 2023-10-26 MED ORDER — NITROGLYCERIN 0.4 MG SL SUBL: 0.4000 mg | SUBLINGUAL_TABLET | SUBLINGUAL | 3 refills | Status: DC | PRN

## 2023-10-26 NOTE — Patient Instructions (Addendum)
Medication Instructions:  Your physician recommends that you continue on your current medications as directed. Please refer to the Current Medication list given to you today.  Do not take your Imdur the morning of your stress test.   *If you need a refill on your cardiac medications before your next appointment, please call your pharmacy*   Lab Work: Your physician recommends that you return for lab work today at [facility].    If you have labs (blood work) drawn today and your tests are completely normal, you will receive your results only by: MyChart Message (if you have MyChart) OR A paper copy in the mail If you have any lab test that is abnormal or we need to change your treatment, we will call you to review the results.   Testing/Procedures: Your physician has requested that you have a lexiscan myoview. For further information please visit [email or web address] Please follow instruction sheet, as given.    Follow-Up: At [facility], you and your health needs are our priority.  As part of our continuing mission to provide you with exceptional heart care, we have created designated Provider Care Teams.  These Care Teams include your primary Cardiologist (physician) and Advanced Practice Providers (APPs -  Physician Assistants and Nurse Practitioners) who all work together to provide you with the care you need, when you need it.  We recommend signing up for the patient portal called "MyChart".  Sign up information is provided on this After Visit Summary.  MyChart is used to connect with patients for Virtual Visits (Telemedicine).  Patients are able to view lab/test results, encounter notes, upcoming appointments, etc.  Non-urgent messages can be sent to your provider as well.   To learn more about what you can do with MyChart, go to [email or web address].    Your next appointment:    Next available after Stress test   Provider:   Luane School, MD     Other Instructions Thank you for choosing [facility]!

## 2023-10-26 NOTE — Addendum Note (Signed)
Addended by: Kerney Elbe on: 10/26/2023 12:33 PM   Modules accepted: Orders

## 2023-10-29 ENCOUNTER — Ambulatory Visit (HOSPITAL_COMMUNITY)
Admission: RE | Admit: 2023-10-29 | Discharge: 2023-10-29 | Disposition: A | Discharge status: Home / Self Care | Payer: PPO | Source: Ambulatory Visit | Attending: Physician Assistant | Admitting: Physician Assistant

## 2023-10-29 ENCOUNTER — Ambulatory Visit (HOSPITAL_BASED_OUTPATIENT_CLINIC_OR_DEPARTMENT_OTHER)
Admission: RE | Admit: 2023-10-29 | Discharge: 2023-10-29 | Disposition: A | Discharge status: Home / Self Care | Payer: PPO | Source: Ambulatory Visit | Attending: Physician Assistant | Admitting: Physician Assistant

## 2023-10-29 DIAGNOSIS — R079 Chest pain, unspecified: Secondary | ICD-10-CM

## 2023-10-29 LAB — NM MYOCAR MULTI W/SPECT W/WALL MOTION / EF
Base ST Depression (mm): 0 mm
LV dias vol: 157 mL (ref 62–150)
LV sys vol: 81 mL
Nuc Stress EF: 48 %
Peak HR: 88 {beats}/min
RATE: 0.4
Rest HR: 59 {beats}/min
Rest Nuclear Isotope Dose: 10.2 mCi
SDS: 2
SRS: 8
SSS: 10
ST Depression (mm): 0 mm
Stress Nuclear Isotope Dose: 30 mCi
TID: 1.08

## 2023-10-29 MED ORDER — SODIUM CHLORIDE FLUSH 0.9 % IV SOLN
INTRAVENOUS | Status: AC
  Administered 2023-10-29: 10 mL via INTRAVENOUS
  Filled 2023-10-29: qty 10

## 2023-10-29 MED ORDER — TECHNETIUM TC 99M TETROFOSMIN IV KIT
30.0000 | PACK | Freq: Once | INTRAVENOUS | Status: AC | PRN
  Administered 2023-10-29: 30 via INTRAVENOUS

## 2023-10-29 MED ORDER — TECHNETIUM TC 99M TETROFOSMIN IV KIT
10.0000 | PACK | Freq: Once | INTRAVENOUS | Status: AC | PRN
  Administered 2023-10-29: 10.23 via INTRAVENOUS

## 2023-10-29 MED ORDER — REGADENOSON 0.4 MG/5ML IV SOLN
INTRAVENOUS | Status: AC
  Administered 2023-10-29: 0.4 mg via INTRAVENOUS
  Filled 2023-10-29: qty 5

## 2023-11-02 ENCOUNTER — Other Ambulatory Visit: Payer: Self-pay

## 2023-11-02 MED ORDER — LISINOPRIL 10 MG PO TABS: 10.0000 mg | ORAL_TABLET | Freq: Every day | ORAL | 3 refills | Status: DC

## 2023-11-02 MED ORDER — AMLODIPINE BESYLATE 10 MG PO TABS: 10.0000 mg | ORAL_TABLET | Freq: Every day | ORAL | 3 refills | Status: DC

## 2023-11-02 MED ORDER — RANOLAZINE ER 1000 MG PO TB12: 1000.0000 mg | ORAL_TABLET | Freq: Two times a day (BID) | ORAL | 3 refills | Status: DC

## 2023-11-05 ENCOUNTER — Other Ambulatory Visit: Payer: Self-pay

## 2023-11-05 DIAGNOSIS — F321 Major depressive disorder, single episode, moderate: Secondary | ICD-10-CM

## 2023-11-05 MED ORDER — CITALOPRAM HYDROBROMIDE 40 MG PO TABS: 40.0000 mg | ORAL_TABLET | Freq: Every day | ORAL | 3 refills | Status: DC

## 2023-11-05 MED ORDER — ATORVASTATIN CALCIUM 80 MG PO TABS: 80.0000 mg | ORAL_TABLET | Freq: Every day | ORAL | 3 refills | Status: DC

## 2023-11-11 ENCOUNTER — Encounter: Payer: Self-pay | Admitting: Cardiovascular Disease

## 2023-11-17 ENCOUNTER — Other Ambulatory Visit: Payer: Self-pay | Admitting: Neurology

## 2023-11-17 DIAGNOSIS — G20A1 Parkinson's disease without dyskinesia, without mention of fluctuations: Secondary | ICD-10-CM

## 2023-11-18 ENCOUNTER — Other Ambulatory Visit: Payer: Self-pay | Admitting: Neurology

## 2023-11-18 DIAGNOSIS — G20A1 Parkinson's disease without dyskinesia, without mention of fluctuations: Secondary | ICD-10-CM

## 2023-11-19 ENCOUNTER — Encounter: Payer: Self-pay | Admitting: Family Medicine

## 2023-11-19 ENCOUNTER — Telehealth: Payer: Self-pay | Admitting: Neurology

## 2023-11-19 ENCOUNTER — Encounter: Payer: Self-pay | Admitting: Neurology

## 2023-11-19 ENCOUNTER — Encounter: Payer: Self-pay | Admitting: Cardiovascular Disease

## 2023-11-19 NOTE — Telephone Encounter (Signed)
 Caller stated patient is having dental surgery that needs to be approved by doctor. Surgery is scheduled for 1-18 and patient needs form before then. Would like to know if faxing or coming by office would be quicker

## 2023-11-19 NOTE — Telephone Encounter (Signed)
 Called and spoke  to wife and daughter Margarie. Patient is asleep.  Informed Kasey to send a copy of the dental clearance form through patient's mychart.   Staff will change the  format for  pre-op pool to handle for Dr Francyne.  Daughter verbalized understanding

## 2023-11-19 NOTE — Telephone Encounter (Signed)
 Patient scheduled.

## 2023-11-20 NOTE — Telephone Encounter (Signed)
 Patient sent mychart message I have forwarded it to Dr. Arbutus Leas

## 2023-11-20 NOTE — Telephone Encounter (Signed)
 Called and left message.

## 2023-11-27 ENCOUNTER — Encounter: Payer: Self-pay | Admitting: Family Medicine

## 2023-11-27 ENCOUNTER — Ambulatory Visit (INDEPENDENT_AMBULATORY_CARE_PROVIDER_SITE_OTHER): Payer: PPO | Admitting: Family Medicine

## 2023-11-27 VITALS — BP 101/57 | HR 43 | Ht 72.0 in | Wt 213.2 lb

## 2023-11-27 DIAGNOSIS — D649 Anemia, unspecified: Secondary | ICD-10-CM | POA: Diagnosis not present

## 2023-11-27 DIAGNOSIS — K089 Disorder of teeth and supporting structures, unspecified: Secondary | ICD-10-CM | POA: Diagnosis not present

## 2023-11-27 DIAGNOSIS — I10 Essential (primary) hypertension: Secondary | ICD-10-CM | POA: Diagnosis not present

## 2023-11-27 DIAGNOSIS — R7303 Prediabetes: Secondary | ICD-10-CM | POA: Diagnosis not present

## 2023-11-27 DIAGNOSIS — Z1211 Encounter for screening for malignant neoplasm of colon: Secondary | ICD-10-CM

## 2023-11-27 LAB — POCT GLYCOSYLATED HEMOGLOBIN (HGB A1C): HbA1c, POC (prediabetic range): 6.3 % (ref 5.7–6.4)

## 2023-11-27 MED ORDER — AMLODIPINE BESYLATE 5 MG PO TABS: 5.0000 mg | ORAL_TABLET | Freq: Every day | ORAL | 0 refills | Status: DC

## 2023-11-27 NOTE — Assessment & Plan Note (Signed)
Due for procedure tomorrow though recommending stopping plavix 5 days prior. Continue ASA. Obtain A1c, CBC. Paperwork completed, will fax once labs result.

## 2023-11-27 NOTE — Patient Instructions (Addendum)
It was great to see you!  Our plans for today:  - We are decreasing your amlodipine to 5mg . I sent a new prescription to your pharmacy. Don't take the 10mg  tablet.  - Stop your clopidogrel (plavix) 5 days before your dental procedure. - We are checking some labs today, we will release these results to your MyChart. - We filled out your dental paperwork.   Take care and seek immediate care sooner if you develop any concerns.   Dr. Linwood Dibbles

## 2023-11-27 NOTE — Assessment & Plan Note (Addendum)
Low today and with orthostatic symptoms with recent fall. Will reduce amlodipine to 5mg . Also with bradycardia, consider reducing ranexa dosing, has Cardiology f/u next month. F/u 1 month.

## 2023-11-27 NOTE — Progress Notes (Signed)
   SUBJECTIVE:   CHIEF COMPLAINT / HPI:   Dental clearance - getting all teeth pulled and getting dentures. Needs form filled out. - never had reactions to local anesthesia.  - on ASA, plavix for CAD  Hypertension: - follows with Cardiology for CAD s/p PCI - Medications: amlodipine, lisinopril  - Compliance: good, has pill packs - Checking BP at home: yes but unsure of numbers - Some lightheadedness with standing, fell the other day. Worse over the last month.   OBJECTIVE:   BP (!) 101/57   Pulse (!) 43   Ht 6' (1.829 m)   Wt 213 lb 3.2 oz (96.7 kg)   SpO2 99%   BMI 28.92 kg/m   Gen: well appearing, in NAD Card: RRR Lungs: CTAB Ext: WWP, no edema   ASSESSMENT/PLAN:   Essential hypertension, benign Low today and with orthostatic symptoms with recent fall. Will reduce amlodipine to 5mg . Also with bradycardia, consider reducing ranexa dosing, has Cardiology f/u next month. F/u 1 month.  Poor dentition Due for procedure tomorrow though recommending stopping plavix 5 days prior. Continue ASA. Obtain A1c, CBC. Paperwork completed, will fax once labs result.     Caro Laroche, DO

## 2023-11-28 LAB — CBC WITH DIFFERENTIAL/PLATELET
Basophils Absolute: 0 10*3/uL (ref 0.0–0.2)
Basos: 0 %
EOS (ABSOLUTE): 0.2 10*3/uL (ref 0.0–0.4)
Eos: 2 %
Hematocrit: 34 % — ABNORMAL LOW (ref 37.5–51.0)
Hemoglobin: 11.4 g/dL — ABNORMAL LOW (ref 13.0–17.7)
Immature Grans (Abs): 0 10*3/uL (ref 0.0–0.1)
Immature Granulocytes: 0 %
Lymphocytes Absolute: 0.9 10*3/uL (ref 0.7–3.1)
Lymphs: 11 %
MCH: 32.6 pg (ref 26.6–33.0)
MCHC: 33.5 g/dL (ref 31.5–35.7)
MCV: 97 fL (ref 79–97)
Monocytes Absolute: 0.6 10*3/uL (ref 0.1–0.9)
Monocytes: 8 %
Neutrophils Absolute: 6.6 10*3/uL (ref 1.4–7.0)
Neutrophils: 79 %
Platelets: 223 10*3/uL (ref 150–450)
RBC: 3.5 x10E6/uL — ABNORMAL LOW (ref 4.14–5.80)
RDW: 13 % (ref 11.6–15.4)
WBC: 8.4 10*3/uL (ref 3.4–10.8)

## 2023-11-30 NOTE — Addendum Note (Signed)
Addended by: Caro Laroche on: 11/30/2023 11:06 AM   Modules accepted: Orders

## 2023-12-08 ENCOUNTER — Telehealth: Payer: Self-pay | Admitting: *Deleted

## 2023-12-08 NOTE — Progress Notes (Unsigned)
Assessment/Plan:   1.  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  Decided to hold off on changing this for now.  Could be contributing to swelling  -Levodopa challenge test done May 09, 2022 and patient's tremor responded fairly significantly to levodopa, although not completely.  This was actually a surprise to both of [place].    -Patient had neurocognitive testing and I personally talked to Dr. Milbert Coulter about this.  He stated that, taken at face value, the patient's results showed severe impairment, and we generally would not do DBS in somebody with severe impairment.  However, Dr. Milbert Coulter felt that the patient had panicked during the test and the test anxiety severely influenced of the results of the test.  Ultimately, patient opted to hold on DBS.  At this point in time, if we decided to reengage, he would need repeat testing given the amount of time that has passed.  -He has decided to hold off on DBS  -He decided that he will not be moving to [place].  -He does have a lot of home stress.  Daughter lives with him.  Wife will leave the house and seems to have some paranoia and memory change.  I have encouraged him to get her to her doctor.  2.  Insomnia  -on melatonin  -avoid napping so late and no longer than 1 hour.  -on mirtazapine  3.  Coronary artery disease  -Cardiology noted patient had cardiac disease, but felt okay to proceed with DBS.  Nonetheless, patient and I decided to hold on that for now.  -his BPs was fairly low in the office.  Not sure if he needs all of the BP meds he is on  4.  RBD  -citalopram may be making it worse - discuss with pcp  -may need to add klonopin in future  5.  Low blood pressure  -Primary care is working on decreasing the patient's amlodipine.  It was  just decreased November 27, 2023.  She was talking about potentially decreasing his Ranexa as well.  6.  LE edema  -concerned that it may be from his heart, and patient is going to contact them, as he is feeling short of breath.  -may be from the amlodipine  -may be from pramipexole but he has been on that a long time.  Happy to reduce if cardiology thinks that there is no other source.  Subjective:   Mak Bonny was seen today in follow up for Parkinsons disease.  My previous records were reviewed prior to todays visit as well as outside records available to me. Pt unaccompanied today.  Primary care notes from January 17 indicate patient's blood pressure has been low (101/57 in her office) and she noted orthostatic symptoms.  His amlodipine was reduced.  She also stated that patient was bradycardic and she was considering reducing his Ranexa dose.  He is getting ready to have some dental work done and his dental office required clearance.  I filled that out to the best of my ability, but also read on there that cardiology had him on Plavix and he would need guidance from them.  I do see that he has contacted cardiology as well.  This is just to be done under local.  They are going to be pulling 25 teeth.  He is having some shortness of breath.  He is noting more lower extremity edema, it started on the left leg and is now on the right.   Current prescribed movement disorder medications: Carbidopa/levodopa 25/100, 3 at 7am/3 at 11am/2 at 3pm/1 at 7pm carbidopa/levodopa 50/200 CR at bed Pramipexole 0.5 mg, 2/2/1  mirtazapine Celexa  PREVIOUS MEDICATIONS: Sinemet and Mirapex;  ALLERGIES:   Allergies  Allergen Reactions   Codeine Hives    Denies Airway involvement    CURRENT MEDICATIONS:  Outpatient Encounter Medications as of 12/10/2023  Medication Sig   amLODipine (NORVASC) 5 MG tablet Take 1 tablet (5 mg total) by mouth daily.   aspirin 81 MG tablet Take 81 mg by mouth daily.    atorvastatin (LIPITOR) 80 MG tablet Take 1 tablet (80 mg total) by mouth daily.   Calcium-Vitamin D 600-125 MG-UNIT TABS Take 1 tablet by mouth daily.    carbidopa-levodopa (SINEMET CR) 50-200 MG tablet TAKE 1 TABLET BY MOUTH AT BEDTIME.   carbidopa-levodopa (SINEMET IR) 25-100 MG tablet TAKE 3 TABLETS BY MOUTH AT 7AM. TAKE 3 TABLETS AT 11AM. TAKE 2 TABLETS AT 3PM. TAKE 1 TABLET AT 7PM.   citalopram (CELEXA) 40 MG tablet Take 1 tablet (40 mg total) by mouth daily.   clopidogrel (PLAVIX) 75 MG tablet TAKE 1 TABLET BY MOUTH ONCE A DAY.   esomeprazole (NEXIUM) 20 MG capsule TAKE 1 CAPSULE BY MOUTH EVERY MORNING.   ezetimibe (ZETIA) 10 MG tablet TAKE 1 TABLET BY MOUTH ONCE A DAY.   famotidine (PEPCID) 20 MG tablet TAKE 1 TABLET BY MOUTH 2 TIMES DAILY.   isosorbide mononitrate (IMDUR) 120 MG 24 hr tablet TAKE ONE TABLET BY MOUTH ONCE DAILY.   lisinopril (ZESTRIL) 10 MG tablet Take 1 tablet (10 mg total) by mouth daily.   nitroGLYCERIN (NITROSTAT) 0.4 MG SL tablet Place 1 tablet (0.4 mg total) under the tongue every 5 (five) minutes as needed for chest pain.   pramipexole (MIRAPEX) 0.5 MG tablet TAKE (2) TABLETS IN THE MORNING TAKE (2) TABLETS IN THE AFTERNOON THEN TAKE (1) TABLET IN THE EVENING.   ranolazine (RANEXA) 1000 MG SR tablet Take 1 tablet (1,000 mg total) by mouth 2 (two) times daily.   No facility-administered encounter medications on file as of 12/10/2023.    Objective:   PHYSICAL EXAMINATION:    VITALS:   Vitals:   12/10/23 1120  BP: 112/60  Pulse: (!) 51  SpO2: 97%  Weight: 216 lb 12.8 oz (98.3 kg)  Height: 6' (1.829 m)      GEN:  The patient appears stated age and is in NAD. HEENT:  Normocephalic, atraumatic.  The mucous membranes are moist. CV: Bradycardic.  Regular. Lungs: CTAB  Neurological examination:  Orientation: The patient is alert and oriented x3. Cranial nerves: There is good facial symmetry with mild facial hypomimia. The speech is fluent and clear. Soft  palate rises symmetrically and there is no tongue deviation. Hearing is intact to conversational tone. Sensation: Sensation is intact to light touch throughout Motor: Strength is at least antigravity x4.   Movement examination: Tone: Normal tone today in the bilateral upper extremities (improved) Abnormal movements: there is LUE/LLE tremor and more rare RLE rest tremor; there is mild R leg dyskinesia Coordination:  There is no decremation with RAM's, with any form of RAMS, including alternating supination and pronation of the forearm, hand opening and closing, finger taps, heel taps and toe taps. Gait and Station: The patient has no difficulty arising out  of a deep-seated chair without the use of the hands. The patient's stride length is good today  I have reviewed and interpreted the following labs independently    Chemistry      Component Value Date/Time   NA 137 10/26/2023 1212   NA 143 03/12/2023 1725   K 4.1 10/26/2023 1212   CL 103 10/26/2023 1212   CO2 26 10/26/2023 1212   BUN 23 10/26/2023 1212   BUN 20 03/12/2023 1725   CREATININE 0.93 10/26/2023 1212   CREATININE 0.91 01/31/2016 1136      Component Value Date/Time   CALCIUM 9.5 10/26/2023 1212   ALKPHOS 78 10/26/2023 1212   AST 19 10/26/2023 1212   ALT 7 10/26/2023 1212   BILITOT 0.9 10/26/2023 1212   BILITOT 0.5 06/16/2022 1210       Lab Results  Component Value Date   WBC 8.4 11/27/2023   HGB 11.4 (L) 11/27/2023   HCT 34.0 (L) 11/27/2023   MCV 97 11/27/2023   PLT 223 11/27/2023    Lab Results  Component Value Date   TSH 1.80 08/30/2019     Total time spent on today's visit was 30 minutes, including both face-to-face time and nonface-to-face time.  Time included that spent on review of records (prior notes available to me/labs/imaging if pertinent), discussing treatment and goals, answering patient's questions and coordinating care.  This did not include the time spent waiting for levodopa to kick  in.  Cc:  Caro Laroche, DO

## 2023-12-08 NOTE — Telephone Encounter (Signed)
Dental office called back and said they have the clearance already dated 11/19/23. She said they are all set and did not need anything further from cardiology.

## 2023-12-08 NOTE — Telephone Encounter (Signed)
I tried to reach the DDS office again. Left vm.

## 2023-12-08 NOTE — Telephone Encounter (Signed)
The pt faxed over thru [facility] CHART his clearance form from the DDS. I called the DDS office for clarification as to the procedure to be done.

## 2023-12-10 ENCOUNTER — Ambulatory Visit (INDEPENDENT_AMBULATORY_CARE_PROVIDER_SITE_OTHER): Payer: PPO | Admitting: Neurology

## 2023-12-10 VITALS — BP 112/60 | HR 51 | Ht 72.0 in | Wt 216.8 lb

## 2023-12-10 DIAGNOSIS — R600 Localized edema: Secondary | ICD-10-CM

## 2023-12-10 DIAGNOSIS — G20B1 Parkinson's disease with dyskinesia, without mention of fluctuations: Secondary | ICD-10-CM

## 2023-12-10 NOTE — Patient Instructions (Signed)
Call your cardiologist about your shortness of breath and leg swelling.  The physicians and staff at [facility] are committed to providing excellent care. You may receive a survey requesting feedback about your experience at our office. We strive to receive "very good" responses to the survey questions. If you feel that your experience would prevent you from giving the office a "very good " response, please contact our office to try to remedy the situation. We may be reached at [phone]. Thank you for taking the time out of your busy day to complete the survey.

## 2023-12-18 NOTE — Progress Notes (Signed)
Cardiology Office Note:  .   Date:  12/28/2023  ID:  Christopher Weeks, DOB [birth year], MRN [id number] PCP: Caro Laroche, DO  [facility] Providers Cardiologist:  Thurmon Fair, MD    History of Present Illness: .   Christopher Weeks is a 73 y.o. male   with coronary artery disease (acute inferior MI, drug-eluting 3.0 x32 mm Promus stent to RCA in 2013; high-grade stenoses in small caliber left circumflex and first diagonal arteries, left for medical therapy), hyperlipidemia, hypertension, prediabetes, Parkinson's disease.   Patient last saw Dr. Royann Shivers 03/2022 with ongoing angina with little activity. Biggest problem was Parkinson's and some orthostatic hypotension. No BB due to bradycardia. NST done prior to brain stimulation and was read as high risk b/c of defect in area of known CAD and no reversibility. Normal LVEF.  I saw 10/2023 with increase in chest pain, NST was read as high risk. Dr. Royann Shivers read and felt he likely had restenosis but we'd be unable to place a stent. We decreased his lisinopril 10 mg increased amlodipine to 10 mg and increased ranexa 100 mg bid. Patient says he is more dizzy when he stands up now. Chest pain is better and only occasional~3-4 times a week, always with activity.3 days ago he got up and was dizzy and tripped on a stair and fell. He's not orthostatic today but did get dizzy standing up.     ROS:    Studies Reviewed: Marland Kitchen    EKG Interpretation Date/Time:  Monday December 28 2023 12:48:32 EST Ventricular Rate:  46 PR Interval:  172 QRS Duration:  114 QT Interval:  494 QTC Calculation: 432 R Axis:   15  Text Interpretation: Sinus bradycardia Incomplete right bundle branch block Possible Lateral infarct , age undetermined When compared with ECG of 14-Jul-2023 15:22, Criteria for Inferior infarct are no longer Present Confirmed by Jacolyn Reedy [phone] on 12/28/2023 12:57:54 PM    Prior CV Studies:   Prior CV Studies:  NST  10/2023 Stress ECG is negative for ischemia and arrhythmias.   LV perfusion is abnormal. There is evidence of infarction with moderate peri-infarct ischemia. Defect 1: There is a medium defect with moderate reduction in uptake present in the apical to basal inferolateral location(s) that is partially reversible. There is abnormal wall motion in the defect area. Consistent with infarction and peri-infarct ischemia. Defect 2: There is a medium defect with mild reduction in uptake present in the mid to basal anterior location(s) that is fixed. There is abnormal wall motion in the defect area. Consistent with infarction. Defect 3: There is a large defect with severe reduction in uptake present in the mid to basal anterolateral location(s) that is fixed. There is abnormal wall motion in the defect area. Consistent with infarction.   Left ventricular function is abnormal. Nuclear stress EF: 48%.   Findings are consistent with infarction with peri-infarct ischemia. The study is high risk.  Dr. Royann Shivers review: "The mid-basal anterior and anterolateral defect correlates to the ramus intermedius lesion where balloon angioplasty was performed in 2020, but a stent could not be passed.  Quite likely that he has restenosis there, but also unlikely that we will be able to place a stent now.  Even if we are able to improve his symptoms with another PTCA, it's likely that he would again have restenosis and that we would be doing it again 6 months later The inferolateral defect probably corresponds to the high-grade stenosis of the ostial left circumflex  that was recommended for medical therapy. His anatomy was not severe enough to justify bypass surgery and he is a poor surgical candidate due to his limited mobility from Parkinson's disease. - He is only on the starting dose of Ranexa 500 mg twice daily.  Can we please boost up to the therapeutic dose of 1000 mg twice daily).  This is the only medication that is unlikely  to worsen his orthostatic hypotension. - He is on a high dose of lisinopril.  We could cut back on this to 10 mg daily and increase amlodipine to 10 mg daily. If that does not help, then I 'd send back for cath, maybe he will get some temporary relief with a plain old balloon angioplasty to the ramus intermedius."    LEFT HEART CATH AND CORONARY ANGIOGRAPHY, LEFT HEART CATH AND CORONARY ANGIOGRAPHY 05/16/2019   Narrative  TARGET LESION Ramus-2 lesion is 80% stenosed.  PTCA: Balloon angioplasty was performed on the 80% stenosis using a BALLOON SAPPHIRE 2.0X15. Unable to pass stent. Post intervention, there is a 30% residual stenosis.  Ost Cx to Prox Cx lesion is 99% stenosed -stable, plan for medical management  Previously placed Prox RCA to Mid RCA stent (unknown type) is widely patent.  -------------------------------------------------------------  The left ventricular systolic function is normal. The left ventricular ejection fraction is 55-65% by visual estimate.  LV end diastolic pressure is normal.   SUMMARY  Three-vessel CAD: Widely patent mid RCA stent, 95% ostial small AV groove Circumflex (too small for PCI), 80% proximal Ramus Intermedius  Unsuccessful PCI, PTCA only of proximal Ramus Intermedius lesion reducing from 80% to 30%.  Normal LVEF with normal LVEDP but severely elevated systemic pressures.   RECOMMENDATIONS  Because of significant comorbidities, the patient we monitored overnight for post PCI evaluation.  TR band removal per protocol  Run ACT for 2 hours post PCI and then stop  Anticipate discharge tomorrow if stable -> would likely consider Ranexa if an option given his Parkinson's.       Bryan Lemma, MD   GATED SPECT MYO PERF [facility] STRESS 1D 04/03/2022   Narrative   Findings are equivocal. The study is high risk.   No ST deviation was noted.   LV perfusion is abnormal. Defect 1: There is a large defect with severe reduction in uptake  present in the apical to basal anterior and anterolateral location(s) that is fixed. There is normal wall motion in the defect area. Consistent with infarction.   Left ventricular function is normal. Nuclear stress EF: 61 %. The left ventricular ejection fraction is normal (55-65%). End diastolic cavity size is normal. End systolic cavity size is normal.   Prior study available for comparison from 02/12/2016.   Abnormal stress test, but no clear reversibility seen. There is a severe fixed defect at the basal and mid anterior/anterolateral wall. There are low counts overall. Given decreased uptake, wall motion incompletely seen but appears grossly normal. There are overall low counts to the study. Cannot determine if this is artifact vs. Infarction based on images.   ECHO COMPLETE WO IMAGING ENHANCING AGENT 10/23/2020   Narrative ECHOCARDIOGRAM REPORT       Patient Name:   HYDE SIRES Date of Exam: 10/23/2020 Medical Rec #:  [id number]        Height:       72.0 in Accession #:    [id number]       Weight:       209.2 lb Date of  Birth:  Oct 14, 1951        BSA:          2.172 m Patient Age:    69 years         BP:           126/64 mmHg Patient Gender: M                HR:           58 bpm. [facility]am [facility]   Procedure: 2D Echo, Cardiac Doppler and Color Doppler   Indications:    I25.118 (ICD-10-CM) CAD   History:        Patient has no prior history of Echocardiogram examinations. Previous Myocardial Infarction; Risk Factors:Hypertension and Dyslipidemia. S/P percutaneous coronary angioplasty, Parkinson's disease, Pre-diabetes.   Sonographer:    Celesta Gentile RCS Referring Phys: [id number] JESSE M CLEAVER   IMPRESSIONS     1. Left ventricular ejection fraction, by estimation, is 70 to 75%. The left ventricle has hyperdynamic function. The left ventricle has no regional wall motion abnormalities. There is mild left ventricular hypertrophy. Left ventricular diastolic parameters  were normal. 2. Right ventricular systolic function is normal. The right ventricular size is normal. 3. Left atrial size was mildly dilated. 4. The mitral valve is normal in structure. No evidence of mitral valve regurgitation. No evidence of mitral stenosis. 5. The aortic valve has an indeterminant number of cusps. Aortic valve regurgitation is not visualized. No aortic stenosis is present.   FINDINGS Left Ventricle: Left ventricular ejection fraction, by estimation, is 70 to 75%. The left ventricle has hyperdynamic function. The left ventricle has no regional wall motion abnormalities. The left ventricular internal cavity size was normal in size. There is mild left ventricular hypertrophy. Left ventricular diastolic parameters were normal.   Right Ventricle: The right ventricular size is normal. No increase in right ventricular wall thickness. Right ventricular systolic function is normal.   Left Atrium: Left atrial size was mildly dilated.   Right Atrium: Right atrial size was normal in size.   Pericardium: There is no evidence of pericardial effusion.   Mitral Valve: The mitral valve is normal in structure. No evidence of mitral valve regurgitation. No evidence of mitral valve stenosis.   Tricuspid Valve: The tricuspid valve is normal in structure. Tricuspid valve regurgitation is not demonstrated. No evidence of tricuspid stenosis.   Aortic Valve: The aortic valve has an indeterminant number of cusps. Aortic valve regurgitation is not visualized. No aortic stenosis is present. Aortic valve mean gradient measures 8.0 mmHg. Aortic valve peak gradient measures 16.2 mmHg. Aortic valve area, by VTI measures 2.52 cm.   Pulmonic Valve: The pulmonic valve was not well visualized. Pulmonic valve regurgitation is not visualized. No evidence of pulmonic stenosis.   Aorta: The aortic root is normal in size and structure.   IAS/Shunts: No atrial level shunt detected by color flow Doppler.      LEFT VENTRICLE PLAX 2D LVIDd:         4.90 cm  Diastology LVIDs:         2.50 cm  LV e' medial:    7.29 cm/s LV PW:         1.10 cm  LV E/e' medial:  11.6 LV IVS:        1.00 cm  LV e' lateral:   14.70 cm/s LVOT diam:     2.10 cm  LV E/e' lateral: 5.8 LV SV:  98 LV SV Index:   45 LVOT Area:     3.46 cm     RIGHT VENTRICLE RV S prime:     18.20 cm/s TAPSE (M-mode): 2.5 cm   LEFT ATRIUM             Index       RIGHT ATRIUM           Index LA diam:        4.00 cm 1.84 cm/m  RA Area:     19.20 cm LA Vol (A2C):   63.9 ml 29.42 ml/m RA Volume:   51.60 ml  23.76 ml/m LA Vol (A4C):   71.6 ml 32.97 ml/m LA Biplane Vol: 68.8 ml 31.68 ml/m AORTIC VALVE AV Area (Vmax):    2.39 cm AV Area (Vmean):   2.70 cm AV Area (VTI):     2.52 cm AV Vmax:           201.00 cm/s AV Vmean:          126.000 cm/s AV VTI:            0.389 m AV Peak Grad:      16.2 mmHg AV Mean Grad:      8.0 mmHg LVOT Vmax:         138.49 cm/s LVOT Vmean:        98.136 cm/s LVOT VTI:          0.283 m LVOT/AV VTI ratio: 0.73   AORTA Ao Root diam: 3.20 cm   MITRAL VALVE MV Area (PHT): 2.90 cm    SHUNTS MV Decel Time: 262 msec    Systemic VTI:  0.28 m MV E velocity: 84.70 cm/s  Systemic Diam: 2.10 cm MV A velocity: 72.30 cm/s MV E/A ratio:  1.17   Dina Rich MD Electronically signed by Dina Rich MD Signature Date/Time: 10/23/2020/12:10:13 PM       Final  Risk Assessment/Calculations:         Physical Exam:   VS:  Ht 6' (1.829 m)   Wt 207 lb 6.4 oz (94.1 kg)   SpO2 97%   BMI 28.13 kg/m   BP 120/60 Wt Readings from Last 3 Encounters:  12/28/23 207 lb 6.4 oz (94.1 kg)  12/10/23 216 lb 12.8 oz (98.3 kg)  11/27/23 213 lb 3.2 oz (96.7 kg)    GEN: Well nourished, well developed in no acute distress NECK: No JVD; No carotid bruits CARDIAC:  RRR, no murmurs, rubs, gallops RESPIRATORY:  Clear to auscultation without rales, wheezing or rhonchi  ABDOMEN: Soft, non-tender,  non-distended EXTREMITIES:  No edema; No deformity   ASSESSMENT AND PLAN: .   Chest pain/CAD: on amlodipine, lisinopril  and Imdur & ranexa.  Beta-blocker intolerant due to bradycardia. Lexiscan Myoview NST  10/2023.Dr. Royann Shivers reviewed the scan that's read as high risk. See above for details. He increased ranexa 1000 mg bid, decreased lisinopril to 10 mg daily and increased amlodipine to 5 mg daily. Chest pain has improved some-still having some but not as frequent but very dizzy. HR 44. He's not orthostatic today. Will stop lisinopril, reduce ranexa 500 mg bid and recheck in 1-2 weeks.   HTN: has had  orthostatic hypotension with a fall 3 days ago. He's not orthostatic on exam today but dizzy with change in position and BP low at neurology office 12/09/33. Will stop lisinopril completely. We may need to back off on Imdur but hold off until we re-evaluate him in 1-2 weeks.  Sinus bradycardia:-will reduce ranexa 500 mg bid   HLD:LDL 60 in May on Zetia and lipitor   PreDM: Most recent hemoglobin A1c was 6.1%     Parkinson's-shaking tremors followed by neuro.          Dispo: f/u 1-2 weeks for BP & HR check.  Signed, Jacolyn Reedy, PA-C

## 2023-12-22 ENCOUNTER — Ambulatory Visit: Payer: PPO | Admitting: Family Medicine

## 2023-12-28 ENCOUNTER — Ambulatory Visit: Payer: PPO | Attending: Physician Assistant | Admitting: Physician Assistant

## 2023-12-28 ENCOUNTER — Encounter: Payer: Self-pay | Admitting: Physician Assistant

## 2023-12-28 VITALS — Ht 72.0 in | Wt 207.4 lb

## 2023-12-28 DIAGNOSIS — G20A1 Parkinson's disease without dyskinesia, without mention of fluctuations: Secondary | ICD-10-CM

## 2023-12-28 DIAGNOSIS — I951 Orthostatic hypotension: Secondary | ICD-10-CM

## 2023-12-28 DIAGNOSIS — I10 Essential (primary) hypertension: Secondary | ICD-10-CM

## 2023-12-28 DIAGNOSIS — R7303 Prediabetes: Secondary | ICD-10-CM | POA: Diagnosis not present

## 2023-12-28 DIAGNOSIS — E7849 Other hyperlipidemia: Secondary | ICD-10-CM

## 2023-12-28 DIAGNOSIS — I25118 Atherosclerotic heart disease of native coronary artery with other forms of angina pectoris: Secondary | ICD-10-CM | POA: Diagnosis not present

## 2023-12-28 MED ORDER — RANOLAZINE ER 500 MG PO TB12: 500.0000 mg | ORAL_TABLET | Freq: Two times a day (BID) | ORAL | 6 refills | Status: DC

## 2023-12-28 NOTE — Patient Instructions (Addendum)
Medication Instructions:  Your physician has recommended you make the following change in your medication:  Stop lisinopril Decrease ranexa to 500 mg twice daily Continue all other medications as prescribed  Labwork: none  Testing/Procedures: none  Follow-Up: Your physician recommends that you schedule a follow-up appointment in: 1 week with Elon Jester or [place] Your physician recommends that you schedule a follow-up appointment in: 3-4 months with Dr. Royann Shivers  Any Other Special Instructions Will Be Listed Below (If Applicable).  If you need a refill on your cardiac medications before your next appointment, please call your pharmacy.

## 2024-01-01 NOTE — Progress Notes (Deleted)
 Cardiology Clinic Note   Patient Name: Christopher Weeks Date of Encounter: 01/01/2024  Primary Care Provider:  Caro Laroche, DO Primary Cardiologist:  Thurmon Fair, MD  Patient Profile    Christopher Weeks 73 year old male presents the clinic today for follow-up evaluation of his orthostatic hypotension and coronary artery disease.  Past Medical History    Past Medical History:  Diagnosis Date   CAD S/P percutaneous coronary angioplasty 07/13/2012   CAD s/p DES RCA (07/2012 - Promus Element 3.0x32) Cath/ PCI to RI 05/15/2019 (unable to place stent). residual CAD of CFX- medical Rx   Coronary artery disease of native artery of native heart with stable angina pectoris 05/12/2019   DES PCI (3x30 2 mm Promus) mid RCA; July 2020 PTCA only of proximal Ramus Intermedius   Dyslipidemia, goal LDL below 70 01/07/2007   Dysrhythmia    Essential hypertension, benign 03/14/2009   Has both true hypertension and white coat hypertension.  Avoid over treatment based on office readings, which run high.     Gastroesophageal reflux disease without esophagitis 01/24/2015   Heart murmur    Hematuria 12/24/2017   2 episodes of gross hematuria without infection   History of colon polyps    History of kidney stones    Impotence of organic origin 01/07/2007   Major depressive disorder 07/11/2015   Mild cognitive impairment of uncertain or unknown etiology 03/28/2022   Myocardial infarction    Parkinson's disease 01/14/2012   Tremor began Jan/Feb 2013 and seems to be progressive Update 04/13/12  Seen by neuro and diagnosed with Parkinson's Disease.    Pre-diabetes 08/06/2012   A1C with cardiac stent was 6.3   Progressive angina 05/16/2019   Past Surgical History:  Procedure Laterality Date   anal fisher repair     CARDIAC CATHETERIZATION  07/11/2012   high graded stenoses small ift circ and small 1st diag   which were  left for medicl therapy, lv systoilc fx preserved   CORONARY ANGIOPLASTY   07/11/2012   90% and 70% lesions RCA  3.0 x32 mm Promus DES   CORONARY BALLOON ANGIOPLASTY N/A 05/16/2019   Procedure: CORONARY BALLOON ANGIOPLASTY;  Surgeon: Marykay Lex, MD;  Location: [facility] INVASIVE CV LAB;  Service: Cardiovascular;  Laterality: N/A;   CORONARY STENT PLACEMENT  07/11/12   EXTRACORPOREAL SHOCK WAVE LITHOTRIPSY Left 04/22/2018   Procedure: LEFT EXTRACORPOREAL SHOCK WAVE LITHOTRIPSY (ESWL);  Surgeon: Malen Gauze, MD;  Location: [facility];  Service: Urology;  Laterality: Left;   EXTRACORPOREAL SHOCK WAVE LITHOTRIPSY Left 05/31/2018   Procedure: LEFT EXTRACORPOREAL SHOCK WAVE LITHOTRIPSY (ESWL);  Surgeon: Sebastian Ache, MD;  Location: [facility];  Service: Urology;  Laterality: Left;  [phone] Arna Medici [id number]   LEFT HEART CATH N/A 07/11/2012   Procedure: LEFT HEART CATH;  Surgeon: Thurmon Fair, MD;  Location: [facility] CATH LAB;  Service: Cardiovascular;  Laterality: N/A;   LEFT HEART CATH AND CORONARY ANGIOGRAPHY N/A 05/16/2019   Procedure: LEFT HEART CATH AND CORONARY ANGIOGRAPHY;  Surgeon: Marykay Lex, MD;  Location: [facility] INVASIVE CV LAB;  Service: Cardiovascular;  Laterality: N/A;   PERCUTANEOUS CORONARY STENT INTERVENTION (PCI-S) Right 07/11/2012   Procedure: PERCUTANEOUS CORONARY STENT INTERVENTION (PCI-S);  Surgeon: Thurmon Fair, MD;  Location: [facility] CATH LAB;  Service: Cardiovascular;  Laterality: Right;    Allergies  Allergies  Allergen Reactions   Codeine Hives    Denies Airway involvement    History of Present Illness    Demarqus Ahmir Bracken has a  PMH of coronary artery disease status post PCI with DES to his RCA 2013 with high-grade stenosis in his small caliber left circumflex and first diagonal (medical management recommended) HTN, HLD, prediabetes, and Parkinson's.  He was seen by me on 10/17/2020.  He was seen in follow-up by Dr. Royann Shivers 5/23.  He reported ongoing angina with little activity.  He noted that his main problem was related to his  Parkinson's and orthostatic hypotension.  He is not a candidate for beta-blocker therapy due to bradycardia.  He underwent NST prior to brain stimulation and was noted to have high risk due to known coronary artery disease and no reversibility.  EF was noted to be normal.  He was seen in follow-up by Herma Carson, PA-C 12/24.  He reported an increase in his chest pain.  Dr. Royann Shivers read stress testing and felt he may have restenosis however he was not amenable to PCI.  His lisinopril was decreased and his amlodipine was increased.  His Ranexa was also increased to 100 mg twice daily.  He followed up 12/28/23.  At that time he noted that his chest pain had improved.  He was not having as frequently.  He did note that he was dizzy.  His heart rate was 44.  He was not orthostatic.  His lisinopril was stopped.  His Ranexa was decreased to 500 mg twice daily.  He presents to the clinic today for follow-up evaluation and states***.  *** denies chest pain, shortness of breath, lower extremity edema, fatigue, palpitations, melena, hematuria, hemoptysis, diaphoresis, weakness, presyncope, syncope, orthopnea, and PND.  Coronary artery disease-continues to note intermittent episodes of chest discomfort with increased physical activity.  This has improved.  Underwent stress testing 12/24 which showed high risk.  Not amenable to PCI due to previous stenting and coronary anatomy.  Reported dizziness 2/25.  His lisinopril was discontinued and his Ranexa was reduced.  Feeling much better today. Maintain p.o. hydration Change positions slowly Maintain physical activity Heart healthy diet  Sinus bradycardia-heart rate today***.  Not a candidate for beta-blocker therapy. Denies episodes of presyncope, syncope, and dizziness improving. Continue to monitor  Essential hypertension-BP today***.  Previously noted to have orthostatic hypotension.  Negative for orthostatic hypotension 2/25.  Lisinopril was discontinued  at that time. Continue amlodipine, Imdur  Hyperlipidemia-LDL***. High-fiber diet Continue aspirin, atorvastatin  Parkinson's disease-follows with PCP/neurology Continue carbidopa levodopa  Disposition: Follow-up with Dr. Royann Shivers or me in 3-4 months.  Home Medications    Prior to Admission medications   Medication Sig Start Date End Date Taking? Authorizing Provider  amLODipine (NORVASC) 5 MG tablet Take 1 tablet (5 mg total) by mouth daily. 11/27/23 02/25/24  Caro Laroche, DO  aspirin 81 MG tablet Take 81 mg by mouth daily.    [provider]  atorvastatin (LIPITOR) 80 MG tablet Take 1 tablet (80 mg total) by mouth daily. 11/05/23   Caro Laroche, DO  Calcium-Vitamin D [phone] MG-UNIT TABS Take 1 tablet by mouth daily.     [provider]  carbidopa-levodopa (SINEMET CR) 50-200 MG tablet TAKE 1 TABLET BY MOUTH AT BEDTIME. 10/05/23   Tat, Octaviano Batty, DO  carbidopa-levodopa (SINEMET IR) 25-100 MG tablet TAKE 3 TABLETS BY MOUTH AT 7AM. TAKE 3 TABLETS AT 11AM. TAKE 2 TABLETS AT 3PM. TAKE 1 TABLET AT 7PM. 11/18/23   Tat, Octaviano Batty, DO  citalopram (CELEXA) 40 MG tablet Take 1 tablet (40 mg total) by mouth daily. 11/05/23   Caro Laroche, DO  clopidogrel (PLAVIX) 75 MG tablet TAKE 1 TABLET BY MOUTH ONCE A DAY. 07/17/23   Croitoru, Mihai, MD  esomeprazole (NEXIUM) 20 MG capsule TAKE 1 CAPSULE BY MOUTH EVERY MORNING. 07/23/23   Caro Laroche, DO  ezetimibe (ZETIA) 10 MG tablet TAKE 1 TABLET BY MOUTH ONCE A DAY. 07/17/23   Croitoru, Mihai, MD  famotidine (PEPCID) 20 MG tablet TAKE 1 TABLET BY MOUTH 2 TIMES DAILY. 07/23/23   Caro Laroche, DO  isosorbide mononitrate (IMDUR) 120 MG 24 hr tablet TAKE ONE TABLET BY MOUTH ONCE DAILY. 07/17/23   Croitoru, Mihai, MD  nitroGLYCERIN (NITROSTAT) 0.4 MG SL tablet Place 0.4 mg under the tongue every 5 (five) minutes x 3 doses as needed for chest pain (if no relief after 2nd dose, proceed to ED or call 911).    [provider]  pramipexole (MIRAPEX) 0.5 MG tablet TAKE (2) TABLETS IN THE MORNING TAKE (2) TABLETS IN THE AFTERNOON THEN TAKE (1) TABLET IN THE EVENING. 11/18/23   Tat, Octaviano Batty, DO  ranolazine (RANEXA) 500 MG 12 hr tablet Take 1 tablet (500 mg total) by mouth 2 (two) times daily. 12/28/23   Dyann Kief, PA-C    Family History    Family History  Adopted: Yes  Problem Relation Age of Onset   Cancer Mother        bone; kidney   Cancer Father    Heart attack Father    is adopted.   Social History    Social History   Socioeconomic History   Marital status: Married    Spouse name: Vickie   Number of children: 2   Years of education: 12   Highest education level: High school graduate  Occupational History   Occupation: Retired/disabled    Comment: maintenance  Tobacco Use   Smoking status: Former    Current packs/day: 0.00    Average packs/day: 1 pack/day for 20.0 years (20.0 ttl pk-yrs)    Types: Cigarettes    Start date: 11/10/1976    Quit date: 11/10/1996    Years since quitting: 27.1   Smokeless tobacco: Never  Vaping Use   Vaping status: Never Used  Substance and Sexual Activity   Alcohol use: No    Alcohol/week: 0.0 standard drinks of alcohol   Drug use: Not Currently    Comment: prior MJ use; not using now, uses CBD oil - 04/21/18   Sexual activity: Yes    Comment: monogamous  Other Topics Concern   Not on file  Social History Narrative   Live with wife 1 level home   Right handed   Caffeine - coffee 1 cup in am and tea with meals   Education - 11th grade   Social Drivers of Health   Financial Resource Strain: Low Risk  (01/09/2023)   Overall Financial Resource Strain (CARDIA)    Difficulty of Paying Living Expenses: Not hard at all  Food Insecurity: No Food Insecurity (01/09/2023)   Hunger Vital Sign    Worried About Running Out of Food in the Last Year: Never true    Ran Out of Food in the Last Year: Never true  Transportation Needs: No Transportation Needs  (01/09/2023)   PRAPARE - Administrator, Civil Service (Medical): No    Lack of Transportation (Non-Medical): No  Physical Activity: Insufficiently Active (01/09/2023)   Exercise Vital Sign    Days of Exercise per Week: 3 days    Minutes of Exercise per Session: 20  min  Stress: No Stress Concern Present (01/09/2023)   [employer] of Occupational Health - Occupational Stress Questionnaire    Feeling of Stress : Not at all  Social Connections: Moderately Isolated (01/09/2023)   Social Connection and Isolation Panel [NHANES]    Frequency of Communication with Friends and Family: Twice a week    Frequency of Social Gatherings with Friends and Family: Once a week    Attends Religious Services: Never    Database administrator or Organizations: No    Attends Banker Meetings: Never    Marital Status: Married  Catering manager Violence: Not At Risk (01/09/2023)   Humiliation, Afraid, Rape, and Kick questionnaire    Fear of Current or Ex-Partner: No    Emotionally Abused: No    Physically Abused: No    Sexually Abused: No     Review of Systems    General:  No chills, fever, night sweats or weight changes.  Cardiovascular:  No chest pain, dyspnea on exertion, edema, orthopnea, palpitations, paroxysmal nocturnal dyspnea. Dermatological: No rash, lesions/masses Respiratory: No cough, dyspnea Urologic: No hematuria, dysuria Abdominal:   No nausea, vomiting, diarrhea, bright red blood per rectum, melena, or hematemesis Neurologic:  No visual changes, wkns, changes in mental status. All other systems reviewed and are otherwise negative except as noted above.  Physical Exam    VS:  There were no vitals taken for this visit. , BMI There is no height or weight on file to calculate BMI. GEN: Well nourished, well developed, in no acute distress. HEENT: normal. Neck: Supple, no JVD, carotid bruits, or masses. Cardiac: RRR, no murmurs, rubs, or gallops. No clubbing,  cyanosis, edema.  Radials/DP/PT 2+ and equal bilaterally.  Respiratory:  Respirations regular and unlabored, clear to auscultation bilaterally. GI: Soft, nontender, nondistended, BS + x 4. MS: no deformity or atrophy. Skin: warm and dry, no rash. Neuro:  Strength and sensation are intact. Psych: Normal affect.  Accessory Clinical Findings    Recent Labs: 10/26/2023: ALT 7; BUN 23; Creatinine, Ser 0.93; Potassium 4.1; Sodium 137 11/27/2023: Hemoglobin 11.4; Platelets 223   Recent Lipid Panel    Component Value Date/Time   CHOL 116 03/12/2023 1725   TRIG 105 03/12/2023 1725   HDL 36 (L) 03/12/2023 1725   CHOLHDL 3.2 03/12/2023 1725   CHOLHDL 4.7 02/12/2016 1000   VLDL 34 (H) 02/12/2016 1000   LDLCALC 60 03/12/2023 1725    No BP recorded.  {Refresh Note OR Click here to enter BP  :1}***    ECG personally reviewed by me today- ***     Nuclear stress test 12/24  Stress ECG is negative for ischemia and arrhythmias.   LV perfusion is abnormal. There is evidence of infarction with moderate peri-infarct ischemia. Defect 1: There is a medium defect with moderate reduction in uptake present in the apical to basal inferolateral location(s) that is partially reversible. There is abnormal wall motion in the defect area. Consistent with infarction and peri-infarct ischemia. Defect 2: There is a medium defect with mild reduction in uptake present in the mid to basal anterior location(s) that is fixed. There is abnormal wall motion in the defect area. Consistent with infarction. Defect 3: There is a large defect with severe reduction in uptake present in the mid to basal anterolateral location(s) that is fixed. There is abnormal wall motion in the defect area. Consistent with infarction.   Left ventricular function is abnormal. Nuclear stress EF: 48%.   Findings are consistent  with infarction with peri-infarct ischemia. The study is high risk.   Dr. Royann Shivers review: "The mid-basal anterior and  anterolateral defect correlates to the ramus intermedius lesion where balloon angioplasty was performed in 2020, but a stent could not be passed.  Quite likely that he has restenosis there, but also unlikely that we will be able to place a stent now.  Even if we are able to improve his symptoms with another PTCA, it's likely that he would again have restenosis and that we would be doing it again 6 months later The inferolateral defect probably corresponds to the high-grade stenosis of the ostial left circumflex that was recommended for medical therapy. His anatomy was not severe enough to justify bypass surgery and he is a poor surgical candidate due to his limited mobility from Parkinson's disease. - He is only on the starting dose of Ranexa 500 mg twice daily.  Can we please boost up to the therapeutic dose of 1000 mg twice daily).  This is the only medication that is unlikely to worsen his orthostatic hypotension. - He is on a high dose of lisinopril.  We could cut back on this to 10 mg daily and increase amlodipine to 10 mg daily. If that does not help, then I 'd send back for cath, maybe he will get some temporary relief with a plain old balloon angioplasty to the ramus intermedius."     LEFT HEART CATH AND CORONARY ANGIOGRAPHY, LEFT HEART CATH AND CORONARY ANGIOGRAPHY 05/16/2019   Narrative  TARGET LESION Ramus-2 lesion is 80% stenosed.  PTCA: Balloon angioplasty was performed on the 80% stenosis using a BALLOON SAPPHIRE 2.0X15. Unable to pass stent. Post intervention, there is a 30% residual stenosis.  Ost Cx to Prox Cx lesion is 99% stenosed -stable, plan for medical management  Previously placed Prox RCA to Mid RCA stent (unknown type) is widely patent.  -------------------------------------------------------------  The left ventricular systolic function is normal. The left ventricular ejection fraction is 55-65% by visual estimate.  LV end diastolic pressure is normal.    SUMMARY  Three-vessel CAD: Widely patent mid RCA stent, 95% ostial small AV groove Circumflex (too small for PCI), 80% proximal Ramus Intermedius  Unsuccessful PCI, PTCA only of proximal Ramus Intermedius lesion reducing from 80% to 30%.  Normal LVEF with normal LVEDP but severely elevated systemic pressures.   RECOMMENDATIONS  Because of significant comorbidities, the patient we monitored overnight for post PCI evaluation.  TR band removal per protocol  Run ACT for 2 hours post PCI and then stop  Anticipate discharge tomorrow if stable -> would likely consider Ranexa if an option given his Parkinson's.       Bryan Lemma, MD   GATED SPECT MYO PERF [facility] STRESS 1D 04/03/2022   Narrative   Findings are equivocal. The study is high risk.   No ST deviation was noted.   LV perfusion is abnormal. Defect 1: There is a large defect with severe reduction in uptake present in the apical to basal anterior and anterolateral location(s) that is fixed. There is normal wall motion in the defect area. Consistent with infarction.   Left ventricular function is normal. Nuclear stress EF: 61 %. The left ventricular ejection fraction is normal (55-65%). End diastolic cavity size is normal. End systolic cavity size is normal.   Prior study available for comparison from 02/12/2016.   Abnormal stress test, but no clear reversibility seen. There is a severe fixed defect at the basal and mid anterior/anterolateral wall. There  are low counts overall. Given decreased uptake, wall motion incompletely seen but appears grossly normal. There are overall low counts to the study. Cannot determine if this is artifact vs. Infarction based on images.   ECHO COMPLETE WO IMAGING ENHANCING AGENT 10/23/2020   Narrative ECHOCARDIOGRAM REPORT       Patient Name:   TREI SCHOCH Date of Exam: 10/23/2020 Medical Rec #:  [id number]        Height:       72.0 in Accession #:    [id number]       Weight:       209.2  lb Date of Birth:  [birth year]        BSA:          2.172 m Patient Age:    69 years         BP:           126/64 mmHg Patient Gender: M                HR:           58 bpm. [facility]   Procedure: 2D Echo, Cardiac Doppler and Color Doppler   Indications:    I25.118 (ICD-10-CM) CAD   History:        Patient has no prior history of Echocardiogram examinations. Previous Myocardial Infarction; Risk Factors:Hypertension and Dyslipidemia. S/P percutaneous coronary angioplasty, Parkinson's disease, Pre-diabetes.   Sonographer:    Celesta Gentile RCS Referring Phys: [id number] Jazzalyn Loewenstein M Maris Bena   IMPRESSIONS     1. Left ventricular ejection fraction, by estimation, is 70 to 75%. The left ventricle has hyperdynamic function. The left ventricle has no regional wall motion abnormalities. There is mild left ventricular hypertrophy. Left ventricular diastolic parameters were normal. 2. Right ventricular systolic function is normal. The right ventricular size is normal. 3. Left atrial size was mildly dilated. 4. The mitral valve is normal in structure. No evidence of mitral valve regurgitation. No evidence of mitral stenosis. 5. The aortic valve has an indeterminant number of cusps. Aortic valve regurgitation is not visualized. No aortic stenosis is present.   FINDINGS Left Ventricle: Left ventricular ejection fraction, by estimation, is 70 to 75%. The left ventricle has hyperdynamic function. The left ventricle has no regional wall motion abnormalities. The left ventricular internal cavity size was normal in size. There is mild left ventricular hypertrophy. Left ventricular diastolic parameters were normal.   Right Ventricle: The right ventricular size is normal. No increase in right ventricular wall thickness. Right ventricular systolic function is normal.   Left Atrium: Left atrial size was mildly dilated.   Right Atrium: Right atrial size was normal in size.   Pericardium: There  is no evidence of pericardial effusion.   Mitral Valve: The mitral valve is normal in structure. No evidence of mitral valve regurgitation. No evidence of mitral valve stenosis.   Tricuspid Valve: The tricuspid valve is normal in structure. Tricuspid valve regurgitation is not demonstrated. No evidence of tricuspid stenosis.   Aortic Valve: The aortic valve has an indeterminant number of cusps. Aortic valve regurgitation is not visualized. No aortic stenosis is present. Aortic valve mean gradient measures 8.0 mmHg. Aortic valve peak gradient measures 16.2 mmHg. Aortic valve area, by VTI measures 2.52 cm.   Pulmonic Valve: The pulmonic valve was not well visualized. Pulmonic valve regurgitation is not visualized. No evidence of pulmonic stenosis.   Aorta: The aortic root is normal in size and structure.  IAS/Shunts: No atrial level shunt detected by color flow Doppler.     Assessment & Plan   1.  ***   Thomasene Ripple. Konstantine Gervasi NP-C     01/01/2024, 3:35 PM [facility] [address] Office [phone] Fax [phone]    I spent***minutes examining this patient, reviewing medications, and using patient centered shared decision making involving their cardiac care.   I spent  20 minutes reviewing past medical history,  medications, and prior cardiac tests.

## 2024-01-05 ENCOUNTER — Ambulatory Visit: Payer: PPO | Admitting: General Practice

## 2024-01-13 ENCOUNTER — Other Ambulatory Visit: Payer: Self-pay | Admitting: Neurology

## 2024-01-13 DIAGNOSIS — G20A1 Parkinson's disease without dyskinesia, without mention of fluctuations: Secondary | ICD-10-CM

## 2024-01-16 ENCOUNTER — Other Ambulatory Visit: Payer: Self-pay | Admitting: Physician Assistant

## 2024-01-18 NOTE — Telephone Encounter (Signed)
 This is a [place] pt.

## 2024-01-27 NOTE — Progress Notes (Deleted)
 Cardiology Office Note:  .   Date:  01/27/2024  ID:  Christopher Weeks, DOB [birth year], MRN [id number] PCP: Caro Laroche, DO  [facility] Providers Cardiologist:  Thurmon Fair, MD   History of Present Illness: .   Christopher Weeks is a 73 y.o. male with past medical history of CAD, hyperlipidemia, hypertension, prediabetes, Parkinson's disease.  He is followed by Dr. Royann Shivers, presents today for a follow-up appointment.  Patient previously had acute inferior MI in 2013 and received DES to the RCA.  Later had left heart catheterization in 05/2019 that showed three-vessel CAD with widely patent mid RCA stent, 95% ostial small AV groove circumflex (too small for PCI), 80% proximal ramus intermedius.  Underwent unsuccessful PCI/PTCA only of proximal ramus intermedius lesion reducing from 80% to 30%.  Echocardiogram in 10/2020 showed EF 70-75% with no regional wall motion abnormality, mild LVH, normal RV function, no significant valvular abnormalities.    Patient was seen by Dr. Royann Shivers in 03/2022 and reported angina with selectivity.  Patient also had issues with orthostatic hypotension related to his Parkinson's disease.  Nuclear stress test in 03/2022 showed severe perfusion abnormality in the distribution of the ramus intermedius artery (where we were unable to place a stent) in the small left circumflex artery, which was known to have a tight blockage that was too small for stenting.  The wall motion appeared normal in that area suggesting severe ischemia but with viable myocardium.   Patient was again seen by cardiology in 10/2023.  Patient reported having increased chest pain.  A nuclear stress test on 10/29/2023 was consistent with infarction, peri-infarct ischemia.  Suspected that patient had restenosis in the ramus intermedius lesion, but was unlikely we would be able to place a stent.  Even if we were able to improve symptoms with another PTCA, suspected that he would again have  restenosis.  He was managed medically.  His Ranexa was increased to 1000 mg twice daily his lisinopril dose was decreased and his amlodipine was increased.  He was seen in follow-up in 12/2023.  At this time, patient reported having some worsening dizziness upon standing but chest pain had improved.  His lisinopril was discontinued.  He had sinus bradycardia however next was decreased to 500 mg twice daily.  CAD  Stable Angina  -Patient previously received DES to RCA in 2013.  Later had heart catheterization in 2020 that showed three-vessel CAD, widely patent mid RCA stent, 95% ostial small AV groove circumflex disease that was too small for PCI, 80% stenosis in proximal ramus intermedius.  Underwent PTCA of the ramus intermedius with improvement from 80% to 30% -Most recent nuclear stress test from 10/2024 showed infarction with peri-infarct ischemia.  Dr. Royann Shivers reviewed, suspected patient had restenosis in the ramus intermedius lesion.  Unlikely we would be able to place a stent.  Recommended medical management -Patient has not been able to tolerate beta-blocker due to bradycardia.  Did not tolerate Ranexa 1000 mg twice daily due to bradycardia -Now on Ranexa 500 mg twice daily, amlodipine 5 mg daily, Imdur 120 mg daily -  -With patient's Parkinson disease, who struggles with orthostatic hypotension. -Continue aspirin 81 mg daily, Lipitor 80 mg daily  HTN  Orthostatic Hypotension  -Orthostatic vital signs negative in 12/2023.  Lisinopril was discontinued at that time -Currently on amlodipine 5 mg daily, Imdur 120 mg daily  HLD  -Lipid panel 03/2023 showed LDL 60 -Continue Lipitor 80 mg daily   Parkinson's disease  -  Followed by neurology   ROS: ***  Studies Reviewed: .        *** Risk Assessment/Calculations:   {Does this patient have ATRIAL FIBRILLATION?[phone]} No BP recorded.  {Refresh Note OR Click here to enter BP  :1}***       Physical Exam:   VS:  There were no vitals  taken for this visit.   Wt Readings from Last 3 Encounters:  12/28/23 207 lb 6.4 oz (94.1 kg)  12/10/23 216 lb 12.8 oz (98.3 kg)  11/27/23 213 lb 3.2 oz (96.7 kg)    GEN: Well nourished, well developed in no acute distress NECK: No JVD; No carotid bruits CARDIAC: ***RRR, no murmurs, rubs, gallops RESPIRATORY:  Clear to auscultation without rales, wheezing or rhonchi  ABDOMEN: Soft, non-tender, non-distended EXTREMITIES:  No edema; No deformity   ASSESSMENT AND PLAN: .   ***    {Are you ordering a CV Procedure (e.g. stress test, cath, DCCV, TEE, etc)?   Press F2        :[id number]}  Dispo: ***  Signed, Jonita Albee, PA-C

## 2024-02-02 ENCOUNTER — Ambulatory Visit: Payer: PPO | Admitting: Cardiology

## 2024-02-11 ENCOUNTER — Ambulatory Visit (INDEPENDENT_AMBULATORY_CARE_PROVIDER_SITE_OTHER)

## 2024-02-11 VITALS — Ht 72.0 in | Wt 207.0 lb

## 2024-02-11 DIAGNOSIS — Z0000 Encounter for general adult medical examination without abnormal findings: Secondary | ICD-10-CM | POA: Diagnosis not present

## 2024-02-11 NOTE — Progress Notes (Addendum)
 Because this visit was a virtual/telehealth visit,  certain criteria was not obtained, such a blood pressure, CBG if applicable, and timed get up and go. Any medications not marked as "taking" were not mentioned during the medication reconciliation part of the visit. Any vitals not documented were not able to be obtained due to this being a telehealth visit or patient was unable to self-report a recent blood pressure reading due to a lack of equipment at home via telehealth. Vitals that have been documented are verbally provided by the patient.   Subjective:   Christopher Weeks is a 73 y.o. who presents for a Medicare Wellness preventive visit.  Visit Complete: Virtual I connected with  Christopher Weeks on 02/11/24 by a audio enabled telemedicine application and verified that I am speaking with the correct person using two identifiers.  Patient Location: Home  Provider Location: Office/Clinic  I discussed the limitations of evaluation and management by telemedicine. The patient expressed understanding and agreed to proceed.  Vital Signs: Because this visit was a virtual/telehealth visit, some criteria may be missing or patient reported. Any vitals not documented were not able to be obtained and vitals that have been documented are patient reported.  VideoDeclined- This patient declined Librarian, academic. Therefore the visit was completed with audio only.  Persons Participating in Visit: Patient assisted by wife.  AWV Questionnaire: No: Patient Medicare AWV questionnaire was not completed prior to this visit.  Cardiac Risk Factors include: advanced age (>57men, >81 women);sedentary lifestyle;dyslipidemia;family history of premature cardiovascular disease;hypertension;male gender     Objective:    Today's Vitals   02/11/24 1148  Weight: 207 lb (93.9 kg)  Height: 6' (1.829 m)  PainSc: 0-No pain   Body mass index is 28.07 kg/m.     02/11/2024   11:49 AM  12/10/2023   11:19 AM 06/02/2023   12:49 PM 03/12/2023    1:38 PM 01/09/2023   10:42 AM 11/25/2022    1:01 PM 06/16/2022   10:52 AM  Advanced Directives  Does Patient Have a Medical Advance Directive? No No No No No No No  Would patient like information on creating a medical advance directive? No - Patient declined   No - Patient declined No - Patient declined      Current Medications (verified) Outpatient Encounter Medications as of 02/11/2024  Medication Sig   amLODipine (NORVASC) 5 MG tablet Take 1 tablet (5 mg total) by mouth daily.   aspirin 81 MG tablet Take 81 mg by mouth daily.   atorvastatin (LIPITOR) 80 MG tablet Take 1 tablet (80 mg total) by mouth daily.   Calcium-Vitamin D 600-125 MG-UNIT TABS Take 1 tablet by mouth daily.    carbidopa-levodopa (SINEMET CR) 50-200 MG tablet TAKE 1 TABLET BY MOUTH AT BEDTIME.   carbidopa-levodopa (SINEMET IR) 25-100 MG tablet TAKE 3 TABLETS BY MOUTH AT 7AM. TAKE 3 TABLETS AT 11AM. TAKE 2 TABLETS AT 3PM. TAKE 1 TABLET AT 7PM.   citalopram (CELEXA) 40 MG tablet Take 1 tablet (40 mg total) by mouth daily.   clopidogrel (PLAVIX) 75 MG tablet TAKE 1 TABLET BY MOUTH ONCE A DAY.   esomeprazole (NEXIUM) 20 MG capsule TAKE 1 CAPSULE BY MOUTH EVERY MORNING.   ezetimibe (ZETIA) 10 MG tablet TAKE 1 TABLET BY MOUTH ONCE A DAY.   famotidine (PEPCID) 20 MG tablet TAKE 1 TABLET BY MOUTH 2 TIMES DAILY.   isosorbide mononitrate (IMDUR) 120 MG 24 hr tablet TAKE ONE TABLET BY MOUTH  ONCE DAILY.   nitroGLYCERIN (NITROSTAT) 0.4 MG SL tablet Place 1 tablet (0.4 mg total) under the tongue every 5 (five) minutes as needed for chest pain.   pramipexole (MIRAPEX) 0.5 MG tablet TAKE TWO TABLETS IN THE MORNING, TWO TABLETS IN THE AFTERNOON, THEN TAKE ONE TABLET IN THE EVENING   ranolazine (RANEXA) 500 MG 12 hr tablet Take 1 tablet (500 mg total) by mouth 2 (two) times daily.   No facility-administered encounter medications on file as of 02/11/2024.    Allergies  (verified) Codeine   History: Past Medical History:  Diagnosis Date   CAD S/P percutaneous coronary angioplasty 07/13/2012   CAD s/p DES RCA (07/2012 - Promus Element 3.0x32) Cath/ PCI to RI 05/15/2019 (unable to place stent). residual CAD of CFX- medical Rx   Coronary artery disease of native artery of native heart with stable angina pectoris 05/12/2019   DES PCI (3x30 2 mm Promus) mid RCA; July 2020 PTCA only of proximal Ramus Intermedius   Dyslipidemia, goal LDL below 70 01/07/2007   Dysrhythmia    Essential hypertension, benign 03/14/2009   Has both true hypertension and white coat hypertension.  Avoid over treatment based on office readings, which run high.     Gastroesophageal reflux disease without esophagitis 01/24/2015   Heart murmur    Hematuria 12/24/2017   2 episodes of gross hematuria without infection   History of colon polyps    History of kidney stones    Impotence of organic origin 01/07/2007   Major depressive disorder 07/11/2015   Mild cognitive impairment of uncertain or unknown etiology 03/28/2022   Myocardial infarction    Parkinson's disease 01/14/2012   Tremor began Jan/Feb 2013 and seems to be progressive Update 04/13/12  Seen by neuro and diagnosed with Parkinson's Disease.    Pre-diabetes 08/06/2012   A1C with cardiac stent was 6.3   Progressive angina 05/16/2019   Past Surgical History:  Procedure Laterality Date   anal fisher repair     CARDIAC CATHETERIZATION  07/11/2012   high graded stenoses small ift circ and small 1st diag   which were  left for medicl therapy, lv systoilc fx preserved   CORONARY ANGIOPLASTY  07/11/2012   90% and 70% lesions RCA  3.0 x32 mm Promus DES   CORONARY BALLOON ANGIOPLASTY N/A 05/16/2019   Procedure: CORONARY BALLOON ANGIOPLASTY;  Surgeon: Marykay Lex, MD;  Location: [facility] INVASIVE CV LAB;  Service: Cardiovascular;  Laterality: N/A;   CORONARY STENT PLACEMENT  07/11/12   EXTRACORPOREAL SHOCK WAVE LITHOTRIPSY Left 04/22/2018    Procedure: LEFT EXTRACORPOREAL SHOCK WAVE LITHOTRIPSY (ESWL);  Surgeon: Malen Gauze, MD;  Location: [facility] ORS;  Service: Urology;  Laterality: Left;   EXTRACORPOREAL SHOCK WAVE LITHOTRIPSY Left 05/31/2018   Procedure: LEFT EXTRACORPOREAL SHOCK WAVE LITHOTRIPSY (ESWL);  Surgeon: Sebastian Ache, MD;  Location: [facility] ORS;  Service: Urology;  Laterality: Left;  [phone] Arna Medici [id number]   LEFT HEART CATH N/A 07/11/2012   Procedure: LEFT HEART CATH;  Surgeon: Thurmon Fair, MD;  Location: [facility] CATH LAB;  Service: Cardiovascular;  Laterality: N/A;   LEFT HEART CATH AND CORONARY ANGIOGRAPHY N/A 05/16/2019   Procedure: LEFT HEART CATH AND CORONARY ANGIOGRAPHY;  Surgeon: Marykay Lex, MD;  Location: [facility] INVASIVE CV LAB;  Service: Cardiovascular;  Laterality: N/A;   PERCUTANEOUS CORONARY STENT INTERVENTION (PCI-S) Right 07/11/2012   Procedure: PERCUTANEOUS CORONARY STENT INTERVENTION (PCI-S);  Surgeon: Thurmon Fair, MD;  Location: [facility] CATH LAB;  Service: Cardiovascular;  Laterality: Right;   Family  History  Adopted: Yes  Problem Relation Age of Onset   Cancer Mother        bone; kidney   Cancer Father    Heart attack Father    Social History   Socioeconomic History   Marital status: Married    Spouse name: Vickie   Number of children: 2   Years of education: 12   Highest education level: High school graduate  Occupational History   Occupation: Retired/disabled    Comment: maintenance  Tobacco Use   Smoking status: Former    Current packs/day: 0.00    Average packs/day: 1 pack/day for 20.0 years (20.0 ttl pk-yrs)    Types: Cigarettes    Start date: 11/10/1976    Quit date: 11/10/1996    Years since quitting: 27.2   Smokeless tobacco: Never  Vaping Use   Vaping status: Never Used  Substance and Sexual Activity   Alcohol use: No    Alcohol/week: 0.0 standard drinks of alcohol   Drug use: Not Currently    Comment: prior MJ use; not using now, uses CBD oil - 04/21/18    Sexual activity: Yes    Comment: monogamous  Other Topics Concern   Not on file  Social History Narrative   Live with wife 1 level home   Right handed   Caffeine - coffee 1 cup in am and tea with meals   Education - 11th grade   Social Drivers of Health   Financial Resource Strain: Low Risk  (02/11/2024)   Overall Financial Resource Strain (CARDIA)    Difficulty of Paying Living Expenses: Not hard at all  Food Insecurity: No Food Insecurity (02/11/2024)   Hunger Vital Sign    Worried About Running Out of Food in the Last Year: Never true    Ran Out of Food in the Last Year: Never true  Transportation Needs: No Transportation Needs (02/11/2024)   PRAPARE - Administrator, Civil Service (Medical): No    Lack of Transportation (Non-Medical): No  Physical Activity: Insufficiently Active (02/11/2024)   Exercise Vital Sign    Days of Exercise per Week: 3 days    Minutes of Exercise per Session: 20 min  Stress: No Stress Concern Present (02/11/2024)   [employer] of Occupational Health - Occupational Stress Questionnaire    Feeling of Stress : Not at all  Social Connections: Moderately Isolated (02/11/2024)   Social Connection and Isolation Panel [NHANES]    Frequency of Communication with Friends and Family: Twice a week    Frequency of Social Gatherings with Friends and Family: Once a week    Attends Religious Services: Never    Database administrator or Organizations: No    Attends Engineer, structural: Never    Marital Status: Married    Tobacco Counseling Counseling given: Not Answered    Clinical Intake:  Pre-visit preparation completed: Yes  Pain : No/denies pain Pain Score: 0-No pain     BMI - recorded: 28.07 Nutritional Status: BMI 25 -29 Overweight Nutritional Risks: None Diabetes: No  Lab Results  Component Value Date   HGBA1C 6.3 11/27/2023   HGBA1C 6.0 (H) 05/12/2019   HGBA1C 5.9 04/08/2018     How often do you need to have someone  help you when you read instructions, pamphlets, or other written materials from your doctor or pharmacy?: 1 - Never What is the last grade level you completed in school?: 11TH GRADE  Interpreter Needed?: No  Information entered  by :: Brand Males. Camesha Farooq, LPN.   Activities of Daily Living     02/11/2024   11:52 AM  In your present state of health, do you have any difficulty performing the following activities:  Hearing? 0  Vision? 0  Difficulty concentrating or making decisions? 0  Walking or climbing stairs? 0  Dressing or bathing? 0  Doing errands, shopping? 0  Preparing Food and eating ? N  Using the Toilet? N  In the past six months, have you accidently leaked urine? N  Do you have problems with loss of bowel control? N  Managing your Medications? N  Managing your Finances? N  Housekeeping or managing your Housekeeping? N    Patient Care Team: Caro Laroche, DO as PCP - General (Family Medicine) Croitoru, Rachelle Hora, MD as PCP - Cardiology (Cardiology) Tat, Octaviano Batty, DO as Consulting Physician (Neurology)  Indicate any recent Medical Services you may have received from other than [facility] providers in the past year (date may be approximate).     Assessment:   This is a routine wellness examination for Oryn.  Hearing/Vision screen Hearing Screening - Comments:: Patient has hearing difficulties and hearing aids.  Vision Screening - Comments:: Wears reading glasses - not up to date with routine eye exams.    Goals Addressed             This Visit's Progress    Client understands the importance of follow-up with providers by attending scheduled visits         Depression Screen     02/11/2024   11:51 AM 11/27/2023    9:55 AM 08/03/2023   11:25 AM 08/03/2023   11:03 AM 03/12/2023    1:37 PM 01/09/2023   10:19 AM 06/16/2022   10:48 AM  PHQ 2/9 Scores  PHQ - 2 Score 0 2   3 0 2  PHQ- 9 Score 2 10   16  7   Exception Documentation   Patient refusal Patient refusal        Fall Risk     02/11/2024   11:50 AM 06/02/2023   12:49 PM 03/12/2023    1:38 PM 01/09/2023   10:19 AM 11/25/2022    1:01 PM  Fall Risk   Falls in the past year? 0 1 1 1  0  Number falls in past yr: 0  0 1 0  Injury with Fall? 0 0 1 1 0  Risk for fall due to : No Fall Risks   Impaired mobility   Follow up Falls prevention discussed;Falls evaluation completed Falls evaluation completed  Falls evaluation completed Falls evaluation completed    MEDICARE RISK AT HOME:  Medicare Risk at Home Any stairs in or around the home?: Yes (front 7 back porch only) If so, are there any without handrails?: No Home free of loose throw rugs in walkways, pet beds, electrical cords, etc?: Yes Adequate lighting in your home to reduce risk of falls?: Yes Life alert?: No Use of a cane, walker or w/c?: Yes Grab bars in the bathroom?: Yes Shower chair or bench in shower?: No Elevated toilet seat or a handicapped toilet?: Yes  TIMED UP AND GO:  Was the test performed?  No  Cognitive Function: 6CIT completed    02/11/2024   11:52 AM 01/16/2015    4:30 PM  MMSE - Mini Mental State Exam  Not completed: Unable to complete   Orientation to time  5  Orientation to Place  5  Registration  3  Attention/ Calculation  3  Recall  3  Language- name 2 objects  2  Language- repeat  1  Language- follow 3 step command  3  Language- read & follow direction  1  Write a sentence  1  Copy design  1  Total score  28        01/09/2023   10:21 AM  6CIT Screen  What Year? 0 points  What month? 0 points  What time? 0 points  Count back from 20 0 points  Months in reverse 0 points  Repeat phrase 10 points  Total Score 10 points    Immunizations Immunization History  Administered Date(s) Administered   Fluad Trivalent(High Dose 65+) 08/03/2023   Influenza Split 12/19/2011   Influenza Whole 11/29/2009   Influenza,inj,Quad PF,6+ Mos 10/26/2013, 08/23/2014, 08/28/2014, 11/02/2019   Influenza-Unspecified  11/15/2015, 08/14/2016, 08/17/2017, 10/13/2020   Pneumococcal Conjugate-13 01/07/2018   Pneumococcal Polysaccharide-23 06/12/2016   Td 11/10/1996, 03/14/2009   Zoster Recombinant(Shingrix) 08/04/2023   Zoster, Live 08/28/2014    Screening Tests Health Maintenance  Topic Date Due   COVID-19 Vaccine (1) Never done   Fecal DNA (Cologuard)  Never done   DTaP/Tdap/Td (3 - Tdap) 03/15/2019   Pneumonia Vaccine 32+ Years old (3 of 3 - PPSV23 or PCV20) 06/12/2021   Zoster Vaccines- Shingrix (2 of 2) 09/29/2023   INFLUENZA VACCINE  06/10/2024   Medicare Annual Wellness (AWV)  02/10/2025   Hepatitis C Screening  Completed   HPV VACCINES  Aged Out    Health Maintenance  Health Maintenance Due  Topic Date Due   COVID-19 Vaccine (1) Never done   Fecal DNA (Cologuard)  Never done   DTaP/Tdap/Td (3 - Tdap) 03/15/2019   Pneumonia Vaccine 33+ Years old (3 of 3 - PPSV23 or PCV20) 06/12/2021   Zoster Vaccines- Shingrix (2 of 2) 09/29/2023   Health Maintenance Items Addressed: Yes, patient is due for Cologuard testing and vaccines.  Additional Screening:  Vision Screening: Recommended annual ophthalmology exams for early detection of glaucoma and other disorders of the eye.  Dental Screening: Recommended annual dental exams for proper oral hygiene  Community Resource Referral / Chronic Care Management: CRR required this visit?  No   CCM required this visit?  No     Plan:     I have personally reviewed and noted the following in the patient's chart:   Medical and social history Use of alcohol, tobacco or illicit drugs  Current medications and supplements including opioid prescriptions. Patient is not currently taking opioid prescriptions. Functional ability and status Nutritional status Physical activity Advanced directives List of other physicians Hospitalizations, surgeries, and ER visits in previous 12 months Vitals Screenings to include cognitive, depression, and  falls Referrals and appointments  In addition, I have reviewed and discussed with patient certain preventive protocols, quality metrics, and best practice recommendations. A written personalized care plan for preventive services as well as general preventive health recommendations were provided to patient.     Mickeal Needy, LPN   04/13/159   After Visit Summary: (MyChart) Due to this being a telephonic visit, the after visit summary with patients personalized plan was offered to patient via MyChart   Notes: Please refer to Routing Comments.

## 2024-02-11 NOTE — Patient Instructions (Addendum)
 Mr. Christopher Weeks , Thank you for taking time to come for your Medicare Wellness Visit. I appreciate your ongoing commitment to your health goals. Please review the following plan we discussed and let me know if I can assist you in the future.   Referrals/Orders/Follow-Ups/Clinician Recommendations: Keep maintaining your health by keeping your appointments with Dr. Linwood Dibbles and any specialists that you may see.  Call us if you need anything.  Have a great year!!!!  This is a list of the screening recommended for you and due dates:  Health Maintenance  Topic Date Due   COVID-19 Vaccine (1) Never done   Cologuard (Stool DNA test)  Never done   DTaP/Tdap/Td vaccine (3 - Tdap) 03/15/2019   Pneumonia Vaccine (3 of 3 - PPSV23 or PCV20) 06/12/2021   Zoster (Shingles) Vaccine (2 of 2) 09/29/2023   Flu Shot  06/10/2024   Medicare Annual Wellness Visit  02/10/2025   Hepatitis C Screening  Completed   HPV Vaccine  Aged Out    Advanced directives: (Declined) Advance directive discussed with you today. Even though you declined this today, please call our office should you change your mind, and we can give you the proper paperwork for you to fill out.  Next Medicare Annual Wellness Visit scheduled for next year: Yes

## 2024-02-15 ENCOUNTER — Other Ambulatory Visit: Payer: Self-pay | Admitting: Neurology

## 2024-02-15 DIAGNOSIS — G20A1 Parkinson's disease without dyskinesia, without mention of fluctuations: Secondary | ICD-10-CM

## 2024-02-23 ENCOUNTER — Encounter: Payer: Self-pay | Admitting: Family Medicine

## 2024-02-23 ENCOUNTER — Ambulatory Visit (INDEPENDENT_AMBULATORY_CARE_PROVIDER_SITE_OTHER): Admitting: Family Medicine

## 2024-02-23 VITALS — BP 117/74 | HR 96 | Wt 209.8 lb

## 2024-02-23 DIAGNOSIS — Z9861 Coronary angioplasty status: Secondary | ICD-10-CM | POA: Diagnosis not present

## 2024-02-23 DIAGNOSIS — G20A1 Parkinson's disease without dyskinesia, without mention of fluctuations: Secondary | ICD-10-CM

## 2024-02-23 DIAGNOSIS — Z1211 Encounter for screening for malignant neoplasm of colon: Secondary | ICD-10-CM | POA: Diagnosis not present

## 2024-02-23 DIAGNOSIS — Z9189 Other specified personal risk factors, not elsewhere classified: Secondary | ICD-10-CM

## 2024-02-23 DIAGNOSIS — D649 Anemia, unspecified: Secondary | ICD-10-CM

## 2024-02-23 DIAGNOSIS — I2510 Atherosclerotic heart disease of native coronary artery without angina pectoris: Secondary | ICD-10-CM | POA: Diagnosis not present

## 2024-02-23 DIAGNOSIS — I10 Essential (primary) hypertension: Secondary | ICD-10-CM | POA: Diagnosis not present

## 2024-02-23 DIAGNOSIS — S51812A Laceration without foreign body of left forearm, initial encounter: Secondary | ICD-10-CM

## 2024-02-23 NOTE — Progress Notes (Unsigned)
   SUBJECTIVE:   CHIEF COMPLAINT / HPI:   Hypertension, CAD s/p PCI: - Medications: imdur, norvasc, plavix, ASA, lipitor, NTG prn, zetia, ranexa - lexiscan myoview 10/2023 high risk, increased ranexa at that time. - some ongoing dizziness and orthostatic hypoTN, lisinopril now stopped.  - follows with cardiology, last appt 12/2023 - Compliance: good. Has pill packs - Checking BP at home: yes, unsure of numbers  Skin tear - hit L forearm on door knob trying to get dog otu fo house. Taking ibuprofen prn for pain.   Depression - celexa, doing well.    Parkinsons - follows with neuro. Compliant with sinemet, pramipexole for RLS.  Anemia - noted on previous labs. No blood in urine or stool. On ASA/Plavix.    HM - due for colon cancer screening, 2nd dose of shingles, PCV20, tdap  OBJECTIVE:   BP 117/74   Pulse 96   Wt 209 lb 12.8 oz (95.2 kg)   SpO2 97%   BMI 28.45 kg/m   Gen: well appearing, in NAD Card: RRR Lungs: CTAB Ext: WWP, no edema. Well approximated skin tear on L forearm, no signs of infection.    ASSESSMENT/PLAN:   Problem List Items Addressed This Visit       Cardiovascular and Mediastinum   CAD S/P percutaneous coronary angioplasty (Chronic)   Essential hypertension, benign   Low normal BP today with some orthostatic symptoms though orthostatic vitals negative in office today and actually improved on recheck. No med changes for now, has Cardiology f/u next month. Return to clinic sooner if worsens.        Nervous and Auditory   Parkinson's disease (HCC)   Stable. Continue current meds. Continue to follow with neuro.         Other   Anemia - Primary   Normocytic, noted on previous labs with no signs of bleeding. Recheck labs. Previously referred to GI, gave number to call and schedule.        Relevant Orders   CBC (Completed)   Other Visit Diagnoses       Streptococcus pneumoniae vaccination indicated       Relevant Orders   Pneumococcal  conjugate vaccine 20-valent (Prevnar 20) (Completed)     Screening for colon cancer         Skin tear of left forearm without complication, initial encounter           Kandis Ormond, DO

## 2024-02-23 NOTE — Patient Instructions (Signed)
 It was great to see you!  Our plans for today:  - We are mailing you another Cologuard. Complete this and mail off per the directions on the package. - Get your shingles and tetanus vaccines at your pharmacy.  We are checking some labs today, we will release these results to your MyChart.  Take care and seek immediate care sooner if you develop any concerns.   Dr. Jadore Veals

## 2024-02-24 DIAGNOSIS — D649 Anemia, unspecified: Secondary | ICD-10-CM | POA: Insufficient documentation

## 2024-02-24 LAB — CBC
Hematocrit: 36 % — ABNORMAL LOW (ref 37.5–51.0)
Hemoglobin: 12 g/dL — ABNORMAL LOW (ref 13.0–17.7)
MCH: 32.5 pg (ref 26.6–33.0)
MCHC: 33.3 g/dL (ref 31.5–35.7)
MCV: 98 fL — ABNORMAL HIGH (ref 79–97)
Platelets: 255 10*3/uL (ref 150–450)
RBC: 3.69 x10E6/uL — ABNORMAL LOW (ref 4.14–5.80)
RDW: 13 % (ref 11.6–15.4)
WBC: 9.7 10*3/uL (ref 3.4–10.8)

## 2024-02-24 NOTE — Assessment & Plan Note (Signed)
Stable. Continue current meds. Continue to follow with neuro.

## 2024-02-24 NOTE — Assessment & Plan Note (Signed)
 Low normal BP today with some orthostatic symptoms though orthostatic vitals negative in office today and actually improved on recheck. No med changes for now, has Cardiology f/u next month. Return to clinic sooner if worsens.

## 2024-02-24 NOTE — Addendum Note (Signed)
 Addended by: Kandis Ormond on: 02/24/2024 10:34 AM   Modules accepted: Orders

## 2024-02-24 NOTE — Assessment & Plan Note (Addendum)
 Normocytic, noted on previous labs with no signs of bleeding. Recheck labs. Previously referred to GI, gave number to call and schedule.

## 2024-03-16 ENCOUNTER — Other Ambulatory Visit: Payer: Self-pay | Admitting: Neurology

## 2024-03-16 DIAGNOSIS — G20A1 Parkinson's disease without dyskinesia, without mention of fluctuations: Secondary | ICD-10-CM

## 2024-03-22 ENCOUNTER — Encounter: Payer: Self-pay | Admitting: Neurology

## 2024-03-22 NOTE — Telephone Encounter (Signed)
 Spoke to patients wife concerning the refill I placed on 03/17/24 for 810 carbidopa  levodopa  that would have gotten patient to August well after his appointmnet in July. Patients wife understood and I let her know to call me if they needed anything else

## 2024-04-04 ENCOUNTER — Other Ambulatory Visit: Payer: Self-pay | Admitting: Neurology

## 2024-04-04 DIAGNOSIS — G20A1 Parkinson's disease without dyskinesia, without mention of fluctuations: Secondary | ICD-10-CM

## 2024-04-05 ENCOUNTER — Ambulatory Visit: Payer: PPO | Attending: Cardiovascular Disease | Admitting: Cardiovascular Disease

## 2024-04-05 ENCOUNTER — Encounter: Payer: Self-pay | Admitting: Cardiovascular Disease

## 2024-04-05 VITALS — BP 120/70 | HR 52 | Ht 72.0 in | Wt 212.0 lb

## 2024-04-05 DIAGNOSIS — I25118 Atherosclerotic heart disease of native coronary artery with other forms of angina pectoris: Secondary | ICD-10-CM

## 2024-04-05 DIAGNOSIS — I951 Orthostatic hypotension: Secondary | ICD-10-CM | POA: Diagnosis not present

## 2024-04-05 DIAGNOSIS — R7303 Prediabetes: Secondary | ICD-10-CM | POA: Diagnosis not present

## 2024-04-05 DIAGNOSIS — E785 Hyperlipidemia, unspecified: Secondary | ICD-10-CM | POA: Diagnosis not present

## 2024-04-05 DIAGNOSIS — I10 Essential (primary) hypertension: Secondary | ICD-10-CM | POA: Diagnosis not present

## 2024-04-05 NOTE — Patient Instructions (Signed)
 Medication Instructions:  Your physician recommends that you continue on your current medications as directed. Please refer to the Current Medication list given to you today.  *If you need a refill on your cardiac medications before your next appointment, please call your pharmacy*  Lab Work: None ordered.  If you have labs (blood work) drawn today and your tests are completely normal, you will receive your results only by: MyChart Message (if you have MyChart) OR A paper copy in the mail If you have any lab test that is abnormal or we need to change your treatment, we will call you to review the results.  Testing/Procedures: None ordered.   Follow-Up: At [facility], you and your health needs are our priority.  As part of our continuing mission to provide you with exceptional heart care, our providers are all part of one team.  This team includes your primary Cardiologist (physician) and Advanced Practice Providers or APPs (Physician Assistants and Nurse Practitioners) who all work together to provide you with the care you need, when you need it.  Your next appointment:   12 months with Dr Alvis Ba

## 2024-04-05 NOTE — Progress Notes (Signed)
 Cardiology office note     Date:  04/05/2024   ID:  Christopher Weeks, DOB 1951/11/02, MRN [id number]   PCP:  Kandis Ormond, DO  Cardiologist:  Farrie Sann Electrophysiologist:  None   Evaluation Performed:  Follow-Up Visit  Chief Complaint:  CAD  History of Present Illness:    Christopher Weeks is a 73 y.o. male with coronary artery disease (acute inferior MI, drug-eluting 3.0 x32 mm Promus stent to RCA in 2013; high-grade stenoses in small caliber left circumflex and ramus intermedius/first diagonal arteries, left for medical therapy), hyperlipidemia, hypertension, prediabetes, Parkinson's disease.  He continues to have occasional problems with chest discomfort and shortness of breath with physical activity, overall seems to remain CCS functional class II.  He was able to mow his lawn, stopping a couple of times for his "indigestion".  Overall chest pain does not interfere much with his usual activities.  He denies orthopnea, PND, claudication, palpitations or full-blown syncope.  Towards the end of the day he has mild-moderate lower extremity edema in the left ankle, but this resolves by the next morning.  His nuclear stress test performed in December 2024 showed mostly scar with some peri-infarct ischemia in the inferolateral wall and basal mid anterolateral wall, corresponding to the territories of the known disease in the left circumflex and first diagonal arteries, EF 48%.  Adjusting his antianginal medications has been challenging since many of them worsen his orthostatic hypotension.  Unable to use beta-blockers due to his bradycardia.  He is currently taking amlodipine  5 mg once daily and isosorbide  mononitrate 120 mg once daily as well as ranolazine  500 mg twice daily.  Metabolic control is good with a most recent LDL cholesterol of 60 a year ago.  HDL is chronically low around 36.  Minimally anemic with a hemoglobin of 12.0.  He has normal renal function and does not have  diabetes mellitus although his hemoglobin A1c (5.8-6.3%) and fasting glucose typically in the 110s usually places him in the prediabetes range (going back for more than 10 years).  The biggest impediment to quality of life remains Parkinson's disease with associated movement disorders and occasionally disabling tremor.  He has had some issues with orthostatic hypotension as well, but has not had any falls or full-blown syncope since his last appointment.  Dr. Winferd Hatter has recommended evaluation for deep brain stimulation.    His most recent lipid profile showed an LDL cholesterol close to target at 78.  His HDL remains low at 38.  Hemoglobin A1c has not been recently rechecked.     Past Medical History:  Diagnosis Date   CAD S/P percutaneous coronary angioplasty 07/13/2012   CAD s/p DES RCA (07/2012 - Promus Element 3.0x32) Cath/ PCI to RI 05/15/2019 (unable to place stent). residual CAD of CFX- medical Rx   Coronary artery disease of native artery of native heart with stable angina pectoris 05/12/2019   DES PCI (3x30 2 mm Promus) mid RCA; July 2020 PTCA only of proximal Ramus Intermedius   Dyslipidemia, goal LDL below 70 01/07/2007   Dysrhythmia    Essential hypertension, benign 03/14/2009   Has both true hypertension and white coat hypertension.  Avoid over treatment based on office readings, which run high.     Gastroesophageal reflux disease without esophagitis 01/24/2015   Heart murmur    Hematuria 12/24/2017   2 episodes of gross hematuria without infection   History of colon polyps    History of kidney stones    Impotence  of organic origin 01/07/2007   Major depressive disorder 07/11/2015   Mild cognitive impairment of uncertain or unknown etiology 03/28/2022   Myocardial infarction    Parkinson's disease 01/14/2012   Tremor began Jan/Feb 2013 and seems to be progressive Update 04/13/12  Seen by neuro and diagnosed with Parkinson's Disease.    Pre-diabetes 08/06/2012   A1C with cardiac  stent was 6.3   Progressive angina 05/16/2019   Past Surgical History:  Procedure Laterality Date   anal fisher repair     CARDIAC CATHETERIZATION  07/11/2012   high graded stenoses small ift circ and small 1st diag   which were  left for medicl therapy, lv systoilc fx preserved   CORONARY ANGIOPLASTY  07/11/2012   90% and 70% lesions RCA  3.0 x32 mm Promus DES   CORONARY BALLOON ANGIOPLASTY N/A 05/16/2019   Procedure: CORONARY BALLOON ANGIOPLASTY;  Surgeon: Arleen Lacer, MD;  Location: [facility] INVASIVE CV LAB;  Service: Cardiovascular;  Laterality: N/A;   CORONARY STENT PLACEMENT  07/11/12   EXTRACORPOREAL SHOCK WAVE LITHOTRIPSY Left 04/22/2018   Procedure: LEFT EXTRACORPOREAL SHOCK WAVE LITHOTRIPSY (ESWL);  Surgeon: Marco Severs, MD;  Location: [facility] ORS;  Service: Urology;  Laterality: Left;   EXTRACORPOREAL SHOCK WAVE LITHOTRIPSY Left 05/31/2018   Procedure: LEFT EXTRACORPOREAL SHOCK WAVE LITHOTRIPSY (ESWL);  Surgeon: Osborn Blaze, MD;  Location: [facility] ORS;  Service: Urology;  Laterality: Left;  [phone] Vincente Green [id number]   LEFT HEART CATH N/A 07/11/2012   Procedure: LEFT HEART CATH;  Surgeon: Luana Rumple, MD;  Location: [facility] CATH LAB;  Service: Cardiovascular;  Laterality: N/A;   LEFT HEART CATH AND CORONARY ANGIOGRAPHY N/A 05/16/2019   Procedure: LEFT HEART CATH AND CORONARY ANGIOGRAPHY;  Surgeon: Arleen Lacer, MD;  Location: [facility] INVASIVE CV LAB;  Service: Cardiovascular;  Laterality: N/A;   PERCUTANEOUS CORONARY STENT INTERVENTION (PCI-S) Right 07/11/2012   Procedure: PERCUTANEOUS CORONARY STENT INTERVENTION (PCI-S);  Surgeon: Luana Rumple, MD;  Location: [facility] CATH LAB;  Service: Cardiovascular;  Laterality: Right;     Current Meds  Medication Sig   amLODipine  (NORVASC ) 5 MG tablet Take 1 tablet (5 mg total) by mouth daily.   aspirin  81 MG tablet Take 81 mg by mouth daily.   atorvastatin  (LIPITOR ) 80 MG tablet Take 1 tablet (80 mg total) by mouth daily.    Calcium -Vitamin D  600-125 MG-UNIT TABS Take 1 tablet by mouth daily.    carbidopa -levodopa  (SINEMET  CR) 50-200 MG tablet TAKE 1 TABLET BY MOUTH AT BEDTIME.   carbidopa -levodopa  (SINEMET  IR) 25-100 MG tablet TAKE 3 TABLETS BY MOUTH AT 7AM, 3 TABLETS AT 11AM, 2 TABLETS AT 3PM, AND 1 TABLET AT 7PM.   citalopram  (CELEXA ) 40 MG tablet Take 1 tablet (40 mg total) by mouth daily.   clopidogrel  (PLAVIX ) 75 MG tablet TAKE 1 TABLET BY MOUTH ONCE A DAY.   esomeprazole  (NEXIUM ) 20 MG capsule TAKE 1 CAPSULE BY MOUTH EVERY MORNING.   ezetimibe  (ZETIA ) 10 MG tablet TAKE 1 TABLET BY MOUTH ONCE A DAY.   famotidine  (PEPCID ) 20 MG tablet TAKE 1 TABLET BY MOUTH 2 TIMES DAILY.   isosorbide  mononitrate (IMDUR ) 120 MG 24 hr tablet TAKE ONE TABLET BY MOUTH ONCE DAILY.   pramipexole  (MIRAPEX ) 0.5 MG tablet TAKE TWO TABLETS BY MOUTH EVERY MORNING, THEN TWO TABLETS IN THE AFTERNOON, THEN TAKE ONE TABLET EVERY EVENING   ranolazine  (RANEXA ) 500 MG 12 hr tablet Take 1 tablet (500 mg total) by mouth 2 (two) times daily.     Allergies:  Codeine   Social History   Tobacco Use   Smoking status: Former    Current packs/day: 0.00    Average packs/day: 1 pack/day for 20.0 years (20.0 ttl pk-yrs)    Types: Cigarettes    Start date: 11/10/1976    Quit date: 11/10/1996    Years since quitting: 27.4   Smokeless tobacco: Never  Vaping Use   Vaping status: Never Used  Substance Use Topics   Alcohol use: No    Alcohol/week: 0.0 standard drinks of alcohol   Drug use: Not Currently    Comment: prior MJ use; not using now, uses CBD oil - 04/21/18     Family Hx: The patient's family history includes Cancer in his father and mother; Heart attack in his father. He was adopted.  ROS:   Please see the history of present illness.     All other systems are reviewed and are negative  Prior CV studies:   The following studies were reviewed today: Angiography/PCI images May 16, 2019   Three-vessel CAD: Widely patent mid RCA  stent, 95% ostial small AV groove Circumflex (too small for PCI), 80% proximal Ramus Intermedius  Unsuccessful PCI, PTCA only of proximal Ramus Intermedius lesion reducing from 80% to 30%.  Normal LVEF with normal LVEDP but severely elevated systemic pressures.  Nuclear study 10/29/2023 Stress ECG is negative for ischemia and arrhythmias.   LV perfusion is abnormal. There is evidence of infarction with moderate peri-infarct ischemia. Defect 1: There is a medium defect with moderate reduction in uptake present in the apical to basal inferolateral location(s) that is partially reversible. There is abnormal wall motion in the defect area. Consistent with infarction and peri-infarct ischemia. Defect 2: There is a medium defect with mild reduction in uptake present in the mid to basal anterior location(s) that is fixed. There is abnormal wall motion in the defect area. Consistent with infarction. Defect 3: There is a large defect with severe reduction in uptake present in the mid to basal anterolateral location(s) that is fixed. There is abnormal wall motion in the defect area. Consistent with infarction.   Left ventricular function is abnormal. Nuclear stress EF: 48%.   Findings are consistent with infarction with peri-infarct ischemia. The study is high risk.  Labs/Other Tests and Data Reviewed:    EKG: Ordered 12/28/2023 and personally reviewed, is unchanged and shows sinus rhythm with old right bundle branch block and old Q waves in leads I and aVL, no acute ischemic repolarization abnormalities Recent Labs: 10/26/2023: ALT 7; BUN 23; Creatinine, Ser 0.93; Potassium 4.1; Sodium 137 02/23/2024: Hemoglobin 12.0; Platelets 255   Recent Lipid Panel Lab Results  Component Value Date/Time   CHOL 116 03/12/2023 05:25 PM   TRIG 105 03/12/2023 05:25 PM   HDL 36 (L) 03/12/2023 05:25 PM   CHOLHDL 3.2 03/12/2023 05:25 PM   CHOLHDL 4.7 02/12/2016 10:00 AM   LDLCALC 60 03/12/2023 05:25 PM    Wt  Readings from Last 3 Encounters:  04/05/24 96.2 kg  02/23/24 95.2 kg  02/11/24 93.9 kg     Objective:    Vital Signs:  BP 120/70 (BP Location: Left Arm, Patient Position: Sitting)   Pulse (!) 52   Ht 6' (1.829 m)   Wt 96.2 kg   SpO2 97%   BMI 28.75 kg/m     General: Alert, oriented x3, no distress, appears comfortable Head: no evidence of trauma, PERRL, EOMI, no exophtalmos or lid lag, no myxedema, no xanthelasma; normal ears, nose and  oropharynx Neck: normal jugular venous pulsations and no hepatojugular reflux; brisk carotid pulses without delay and no carotid bruits Chest: clear to auscultation, no signs of consolidation by percussion or palpation, normal fremitus, symmetrical and full respiratory excursions Cardiovascular: normal position and quality of the apical impulse, regular rhythm, normal first and widely split second heart sounds, no murmurs, rubs or gallops Abdomen: no tenderness or distention, no masses by palpation, no abnormal pulsatility or arterial bruits, normal bowel sounds, no hepatosplenomegaly Extremities: no clubbing, cyanosis or edema; 2+ radial, ulnar and brachial pulses bilaterally; 2+ right femoral, posterior tibial and dorsalis pedis pulses; 2+ left femoral, posterior tibial and dorsalis pedis pulses; no subclavian or femoral bruits Neurological: grossly nonfocal except for resting tremor Psych: Normal mood and affect     ASSESSMENT & PLAN:    1. Coronary artery disease of native artery of native heart with stable angina pectoris (HCC)   2. Essential hypertension   3. Orthostatic hypotension   4. Dyslipidemia (high LDL; low HDL)   5. Prediabetes       CAD: CCS functional class II angina on amlodipine  and long-acting nitrates.  Beta-blocker intolerant due to bradycardia.  Orthostatic hypotension limits additional antianginal medication adjustment.  Did not tolerate higher doses of ranolazine .  His nuclear scan shows a largely fixed perfusion  abnormality in the lateral wall corresponding to known severely diseased left circumflex and ramus intermedius vessels that are not amenable to percutaneous revascularization.   HTN: Well-controlled.  Has superimposed neurogenic orthostatic hypotension which limits medication adjustment. I suspect his ankle swelling is due to treatment with amlodipine .  We will avoid diuretics due to his risk of orthostatic hypotension. HLP: LDL in target range.  Chronically low HDL cholesterol.  Really not able to increase physical activity to Parkinson's.  Talked about losing some weight by limiting carbohydrate and saturated fat intake. PreDM: Most recent hemoglobin A1c was 6.3%.  Again reviewed importance of dietary compliance.   There are no Patient Instructions on file for this visit.   Signed, Luana Rumple, MD  04/05/2024 2:15 PM    [facility]

## 2024-04-18 ENCOUNTER — Other Ambulatory Visit: Payer: Self-pay | Admitting: Cardiology

## 2024-05-10 ENCOUNTER — Other Ambulatory Visit: Payer: Self-pay | Admitting: Neurology

## 2024-05-10 DIAGNOSIS — G20A1 Parkinson's disease without dyskinesia, without mention of fluctuations: Secondary | ICD-10-CM

## 2024-06-08 NOTE — Progress Notes (Unsigned)
 Assessment/Plan:   1.  Parkinsons Disease with levodopa  resistant resistant tremor  -continue carbidopa /levodopa  25/100, 3 at 7am/3 at 11am/2 at 3pm/1 at 7pm  -continue carbidopa /levodopa  50/200 at bed for first AM on  -continue Pramipexole , 0.5 mg, 2/2/1 (7am/11am/4pm).  Decided to hold off on changing this for now.  Could be contributing to swelling  -Levodopa  challenge test done May 09, 2022 and patient's tremor responded fairly significantly to levodopa , although not completely.  This was actually a surprise to both of us .    -Patient had neurocognitive testing and I personally talked to Dr. Richie about this.  He stated that, taken at face value, the patient's results showed severe impairment, and we generally would not do DBS in somebody with severe impairment.  However, Dr. Richie felt that the patient had panicked during the test and the test anxiety severely influenced of the results of the test.  Ultimately, patient opted to hold on DBS.  At this point in time, if we decided to reengage, he would need repeat testing given the amount of time that has passed.  -He has decided to hold off on DBS  -He decided that he will not be moving to [place].  -He does have a lot of home stress.  Daughter lives with him.  Wife will not leave the house and seems to have some paranoia and memory change.  I have encouraged him to get her to her doctor.  2.  Insomnia  -on melatonin  -avoid napping so late and no longer than 1 hour.  -on mirtazapine   3.  Coronary artery disease  -Cardiology noted patient had cardiac disease, but felt okay to proceed with DBS.  Nonetheless, patient and I decided to hold on that for now.  4.  RBD  -citalopram  may be making it worse - discuss with pcp  -may need to add klonopin in future  5.  Low blood pressure  - Cardiology has decreased the Ranexa , stop the lisinopril  and noted that they may need to decrease Imdur .  Patient is also still on amiodarone.  6.  LE  edema  - Likely from amlodipine    Subjective:   Christopher Weeks was seen today in follow up for Parkinsons disease.  My previous records were reviewed prior to todays visit as well as outside records available to me.  Patient continues on levodopa  and pramipexole .  He has no sleep attacks or compulsive behaviors.   Last visit, we did discuss that his blood pressures were getting low and he was having some frequent dizziness.  His heart rate was 44.  He saw the cardiology PA not long after he saw me and they reduced the Ranexa  and stopped the lisinopril  and discussed that they may need to decrease the Imdur  and noted that they were going to reevaluate him in 2 weeks, but I do not see that that happened.  He did, however, follow-up a few months later.  He remains on amlodipine .  Current prescribed movement disorder medications: Carbidopa /levodopa  25/100, 3 at 7am/3 at 11am/2 at 3pm/1 at 7pm carbidopa /levodopa  50/200 CR at bed Pramipexole  0.5 mg, 2/2/1  mirtazapine  Celexa   PREVIOUS MEDICATIONS: Sinemet  and Mirapex ;  ALLERGIES:   Allergies  Allergen Reactions   Codeine Hives    Denies Airway involvement    CURRENT MEDICATIONS:  Outpatient Encounter Medications as of 06/09/2024  Medication Sig   amLODipine  (NORVASC ) 5 MG tablet Take 1 tablet (5 mg total) by mouth daily.   aspirin  81 MG  tablet Take 81 mg by mouth daily.   atorvastatin  (LIPITOR ) 80 MG tablet Take 1 tablet (80 mg total) by mouth daily.   Calcium -Vitamin D  600-125 MG-UNIT TABS Take 1 tablet by mouth daily.    carbidopa -levodopa  (SINEMET  CR) 50-200 MG tablet TAKE 1 TABLET BY MOUTH AT BEDTIME.   carbidopa -levodopa  (SINEMET  IR) 25-100 MG tablet TAKE 3 TABLETS BY MOUTH AT 7AM, 3 TABLETS AT 11AM, 2 TABLETS AT 3PM, AND 1 TABLET AT 7PM.   citalopram  (CELEXA ) 40 MG tablet Take 1 tablet (40 mg total) by mouth daily.   clopidogrel  (PLAVIX ) 75 MG tablet TAKE 1 TABLET BY MOUTH ONCE A DAY.   esomeprazole  (NEXIUM ) 20 MG capsule TAKE 1  CAPSULE BY MOUTH EVERY MORNING.   ezetimibe  (ZETIA ) 10 MG tablet TAKE 1 TABLET BY MOUTH ONCE A DAY.   famotidine  (PEPCID ) 20 MG tablet TAKE 1 TABLET BY MOUTH 2 TIMES DAILY.   isosorbide  mononitrate (IMDUR ) 120 MG 24 hr tablet TAKE ONE TABLET BY MOUTH ONCE DAILY.   nitroGLYCERIN  (NITROSTAT ) 0.4 MG SL tablet Place 1 tablet (0.4 mg total) under the tongue every 5 (five) minutes as needed for chest pain.   pramipexole  (MIRAPEX ) 0.5 MG tablet TAKE TWO TABLETS BY MOUTH EVERY MORNING, THEN TWO TABLETS IN THE AFTERNOON, THEN TAKE ONE TABLET EVERY EVENING   ranolazine  (RANEXA ) 500 MG 12 hr tablet Take 1 tablet (500 mg total) by mouth 2 (two) times daily.   No facility-administered encounter medications on file as of 06/09/2024.    Objective:   PHYSICAL EXAMINATION:    VITALS:   There were no vitals filed for this visit.     GEN:  The patient appears stated age and is in NAD. HEENT:  Normocephalic, atraumatic.  The mucous membranes are moist. CV: Bradycardic.  Regular. Lungs: CTAB  Neurological examination:  Orientation: The patient is alert and oriented x3. Cranial nerves: There is good facial symmetry with mild facial hypomimia. The speech is fluent and clear. Soft palate rises symmetrically and there is no tongue deviation. Hearing is intact to conversational tone. Sensation: Sensation is intact to light touch throughout Motor: Strength is at least antigravity x4.   Movement examination: Tone: Normal tone today in the bilateral upper extremities (improved) Abnormal movements: there is LUE/LLE tremor and more rare RLE rest tremor; there is mild R leg dyskinesia Coordination:  There is no decremation with RAM's, with any form of RAMS, including alternating supination and pronation of the forearm, hand opening and closing, finger taps, heel taps and toe taps. Gait and Station: The patient has no difficulty arising out of a deep-seated chair without the use of the hands. The patient's  stride length is good today  I have reviewed and interpreted the following labs independently    Chemistry      Component Value Date/Time   NA 137 10/26/2023 1212   NA 143 03/12/2023 1725   K 4.1 10/26/2023 1212   CL 103 10/26/2023 1212   CO2 26 10/26/2023 1212   BUN 23 10/26/2023 1212   BUN 20 03/12/2023 1725   CREATININE 0.93 10/26/2023 1212   CREATININE 0.91 01/31/2016 1136      Component Value Date/Time   CALCIUM  9.5 10/26/2023 1212   ALKPHOS 78 10/26/2023 1212   AST 19 10/26/2023 1212   ALT 7 10/26/2023 1212   BILITOT 0.9 10/26/2023 1212   BILITOT 0.5 06/16/2022 1210       Lab Results  Component Value Date   WBC 9.7 02/23/2024  HGB 12.0 (L) 02/23/2024   HCT 36.0 (L) 02/23/2024   MCV 98 (H) 02/23/2024   PLT 255 02/23/2024    Lab Results  Component Value Date   TSH 1.80 08/30/2019     Total time spent on today's visit was *** minutes, including both face-to-face time and nonface-to-face time.  Time included that spent on review of records (prior notes available to me/labs/imaging if pertinent), discussing treatment and goals, answering patient's questions and coordinating care.  This did not include the time spent waiting for levodopa  to kick in.  Cc:  Rumball, Alison M, DO

## 2024-06-09 ENCOUNTER — Ambulatory Visit (INDEPENDENT_AMBULATORY_CARE_PROVIDER_SITE_OTHER): Payer: PPO | Admitting: Neurology

## 2024-06-09 VITALS — BP 122/74 | HR 51 | Ht 72.0 in | Wt 215.8 lb

## 2024-06-09 DIAGNOSIS — R441 Visual hallucinations: Secondary | ICD-10-CM | POA: Diagnosis not present

## 2024-06-09 DIAGNOSIS — G20A1 Parkinson's disease without dyskinesia, without mention of fluctuations: Secondary | ICD-10-CM | POA: Diagnosis not present

## 2024-06-09 DIAGNOSIS — G20B2 Parkinson's disease with dyskinesia, with fluctuations: Secondary | ICD-10-CM

## 2024-06-09 MED ORDER — CARBIDOPA-LEVODOPA 25-100 MG PO TABS: ORAL_TABLET | ORAL | 1 refills | Status: AC

## 2024-06-09 MED ORDER — PRAMIPEXOLE DIHYDROCHLORIDE 0.5 MG PO TABS: 0.5000 mg | ORAL_TABLET | Freq: Three times a day (TID) | ORAL | 1 refills | Status: DC

## 2024-06-09 MED ORDER — CARBIDOPA-LEVODOPA ER 50-200 MG PO TBCR: 1.0000 | EXTENDED_RELEASE_TABLET | Freq: Every day | ORAL | 1 refills | Status: AC

## 2024-06-09 NOTE — Patient Instructions (Addendum)
 Decrease pramipexole  to 0.5 mg, 2 at 7am, 1 at 11am and 4pm x 2 weeks and then decrease pramipexole  to 1 tablet at 7am/11am/4pm  SAVE THE DATE!  We are planning a Parkinsons Disease educational symposium at [facility], [address] [place], [place], [place] [address] on September 19.  We will have a movement disorder physician expert from [employer] coming to speak and a caregiver speaker.  We will have a panel of experts that will show you who you may need on your team of people on your journey with Parkinsons.  I hope to see you there!  Use this QR code to register by scanning it with the camera app on your phone:      Need more help with registration?  Contact [email or web address]

## 2024-06-13 ENCOUNTER — Telehealth: Payer: Self-pay | Admitting: Neurology

## 2024-06-13 NOTE — Telephone Encounter (Signed)
 [facility] called and left a message with the access nurse. She needs to clarify medication with provider.

## 2024-06-13 NOTE — Telephone Encounter (Signed)
 I have already clarified with [employer]

## 2024-07-02 ENCOUNTER — Other Ambulatory Visit: Payer: Self-pay | Admitting: Family Medicine

## 2024-07-02 ENCOUNTER — Other Ambulatory Visit: Payer: Self-pay | Admitting: Cardiovascular Disease

## 2024-07-18 ENCOUNTER — Ambulatory Visit

## 2024-07-18 DIAGNOSIS — Z23 Encounter for immunization: Secondary | ICD-10-CM

## 2024-07-18 NOTE — Progress Notes (Signed)
 Patient requested a Flu vaccine during his wife's PCP apt.  Flu vaccine given in RD, patient tolerated well.  See admin for details.

## 2024-08-02 ENCOUNTER — Other Ambulatory Visit: Payer: Self-pay | Admitting: Physician Assistant

## 2024-08-04 ENCOUNTER — Encounter: Payer: Self-pay | Admitting: Emergency Medicine

## 2024-08-04 ENCOUNTER — Other Ambulatory Visit: Payer: Self-pay

## 2024-08-04 ENCOUNTER — Ambulatory Visit
Admission: EM | Admit: 2024-08-04 | Discharge: 2024-08-04 | Disposition: A | Discharge status: Home / Self Care | Attending: Family Medicine | Admitting: Family Medicine

## 2024-08-04 DIAGNOSIS — L03116 Cellulitis of left lower limb: Secondary | ICD-10-CM | POA: Diagnosis not present

## 2024-08-04 DIAGNOSIS — R600 Localized edema: Secondary | ICD-10-CM | POA: Diagnosis not present

## 2024-08-04 MED ORDER — CEPHALEXIN 500 MG PO CAPS: 500.0000 mg | ORAL_CAPSULE | Freq: Two times a day (BID) | ORAL | 0 refills | Status: AC

## 2024-08-04 MED ORDER — CHLORHEXIDINE GLUCONATE 4 % EX SOLN: Freq: Every day | CUTANEOUS | 0 refills | Status: AC | PRN

## 2024-08-04 MED ORDER — MUPIROCIN 2 % EX OINT: 1.0000 | TOPICAL_OINTMENT | Freq: Two times a day (BID) | CUTANEOUS | 0 refills | Status: AC

## 2024-08-04 MED ORDER — BACITRACIN 500 UNIT/GM EX OINT: 1.0000 | TOPICAL_OINTMENT | Freq: Two times a day (BID) | CUTANEOUS | Status: DC

## 2024-08-04 NOTE — ED Triage Notes (Signed)
 Pt reports lower left leg swelling x2 months ago. Pt reports doctor reported is was related to medication. Pt reports has noticed red discoloration to left ankle x1 month and reports intermittent pain to left shin. Skin noted to be shiney and moderately swollen compared to RLE.

## 2024-08-04 NOTE — ED Notes (Addendum)
 Site cleaned with hibiclense and sterile water. Pt tolerated well.  Bacitracin  applied to left ankle/foot, nonadherent pad, secured with coban. Pt tolerated well.  Site management and infection prevention education reviewed. Pt verbalized understanding.

## 2024-08-04 NOTE — ED Provider Notes (Signed)
 [facility]    CSN: [id number] Arrival date & time: 08/04/24  1359      History   Chief Complaint Chief Complaint  Patient presents with   Leg Swelling    HPI Christopher Weeks is a 73 y.o. male.   Patient presenting today with 14-month history of ongoing left lower leg edema that his PCP thinks is related to his medications.  He states that the past 2 weeks he has had progressively worsening swelling, pain to the anterior aspect of the leg and ankle and now the top of his foot is also red, painful.  Denies known injury to the area, numbness, tingling, weakness, fevers.  So far not been trying anything over-the-counter for symptoms.  Of note, does take aspirin  and Ranexa  daily.    Past Medical History:  Diagnosis Date   CAD S/P percutaneous coronary angioplasty 07/13/2012   CAD s/p DES RCA (07/2012 - Promus Element 3.0x32) Cath/ PCI to RI 05/15/2019 (unable to place stent). residual CAD of CFX- medical Rx   Coronary artery disease of native artery of native heart with stable angina pectoris 05/12/2019   DES PCI (3x30 2 mm Promus) mid RCA; July 2020 PTCA only of proximal Ramus Intermedius   Dyslipidemia, goal LDL below 70 01/07/2007   Dysrhythmia    Essential hypertension, benign 03/14/2009   Has both true hypertension and white coat hypertension.  Avoid over treatment based on office readings, which run high.     Gastroesophageal reflux disease without esophagitis 01/24/2015   Heart murmur    Hematuria 12/24/2017   2 episodes of gross hematuria without infection   History of colon polyps    History of kidney stones    Impotence of organic origin 01/07/2007   Major depressive disorder 07/11/2015   Mild cognitive impairment of uncertain or unknown etiology 03/28/2022   Myocardial infarction    Parkinson's disease 01/14/2012   Tremor began Jan/Feb 2013 and seems to be progressive Update 04/13/12  Seen by neuro and diagnosed with Parkinson's Disease.    Pre-diabetes  08/06/2012   A1C with cardiac stent was 6.3   Progressive angina 05/16/2019    Patient Active Problem List   Diagnosis Date Noted   Anemia 02/24/2024   Poor dentition 06/16/2022   Dysrhythmia 03/28/2022   Mild cognitive impairment of uncertain or unknown etiology 03/28/2022   Coronary artery disease of native artery of native heart with stable angina pectoris 05/12/2019   Major depressive disorder 07/11/2015   Gastroesophageal reflux disease without esophagitis 01/24/2015   Pre-diabetes 08/06/2012   CAD S/P percutaneous coronary angioplasty 07/13/2012   Parkinson's disease (HCC) 01/14/2012   Essential hypertension, benign 03/14/2009   Dyslipidemia, goal LDL below 70 01/07/2007   Impotence of organic origin 01/07/2007    Past Surgical History:  Procedure Laterality Date   anal fisher repair     CARDIAC CATHETERIZATION  07/11/2012   high graded stenoses small ift circ and small 1st diag   which were  left for medicl therapy, lv systoilc fx preserved   CORONARY ANGIOPLASTY  07/11/2012   90% and 70% lesions RCA  3.0 x32 mm Promus DES   CORONARY BALLOON ANGIOPLASTY N/A 05/16/2019   Procedure: CORONARY BALLOON ANGIOPLASTY;  Surgeon: Anner Alm ORN, MD;  Location: [facility];  Service: Cardiovascular;  Laterality: N/A;   CORONARY STENT PLACEMENT  07/11/12   EXTRACORPOREAL SHOCK WAVE LITHOTRIPSY Left 04/22/2018   Procedure: LEFT EXTRACORPOREAL SHOCK WAVE LITHOTRIPSY (ESWL);  Surgeon: Sherrilee Belvie CROME,  MD;  Location: [facility];  Service: Urology;  Laterality: Left;   EXTRACORPOREAL SHOCK WAVE LITHOTRIPSY Left 05/31/2018   Procedure: LEFT EXTRACORPOREAL SHOCK WAVE LITHOTRIPSY (ESWL);  Surgeon: Alvaro Hummer, MD;  Location: [facility];  Service: Urology;  Laterality: Left;  [phone] CHER [id number]   LEFT HEART CATH N/A 07/11/2012   Procedure: LEFT HEART CATH;  Surgeon: Jerel Balding, MD;  Location: [facility];  Service: Cardiovascular;  Laterality: N/A;   LEFT HEART  CATH AND CORONARY ANGIOGRAPHY N/A 05/16/2019   Procedure: LEFT HEART CATH AND CORONARY ANGIOGRAPHY;  Surgeon: Anner Alm ORN, MD;  Location: [facility] IN[facility] CV [facility];  Service: Cardiovascular;  Laterality: N/A;   PERCUTANEOUS CORONARY STENT INTERVENTION (PCI-S) Right 07/11/2012   Procedure: PERCUTANEOUS CORONARY STENT INTERVENTION (PCI-S);  Surgeon: Jerel Balding, MD;  Location: [facility] CATH LAB;  Service: Cardiovascular;  Laterality: Right;       Home Medications    Prior to Admission medications   Medication Sig Start Date End Date Taking? Authorizing Provider  cephALEXin  (KEFLEX ) 500 MG capsule Take 1 capsule (500 mg total) by mouth 2 (two) times daily. 08/04/24  Yes Stuart Vernell Norris, PA-C  chlorhexidine  (HIBICLENS ) 4 % external liquid Apply topically daily as needed. 08/04/24  Yes Stuart Vernell Norris, PA-C  mupirocin  ointment (BACTROBAN ) 2 % Apply 1 Application topically 2 (two) times daily. 08/04/24  Yes Stuart Vernell Norris, PA-C  amLODipine  (NORVASC ) 5 MG tablet Take 1 tablet (5 mg total) by mouth daily. 11/27/23 06/09/24  Rumball, Alison M, DO  aspirin  81 MG tablet Take 81 mg by mouth daily.    [provider]  atorvastatin  (LIPITOR ) 80 MG tablet Take 1 tablet (80 mg total) by mouth daily. 11/05/23   Rumball, Alison M, DO  Calcium -Vitamin D  600-125 MG-UNIT TABS Take 1 tablet by mouth daily.     [provider]  carbidopa -levodopa  (SINEMET  CR) 50-200 MG tablet Take 1 tablet by mouth at bedtime. 06/09/24   Tat, Asberry RAMAN, DO  carbidopa -levodopa  (SINEMET  IR) 25-100 MG tablet TAKE 3 TABLETS BY MOUTH AT 7AM, 3 TABLETS AT 11AM, 2 TABLETS AT 3PM, AND 1 TABLET AT 7PM. 06/09/24   Tat, Asberry RAMAN, DO  citalopram  (CELEXA ) 40 MG tablet Take 1 tablet (40 mg total) by mouth daily. 11/05/23   Madelon Donald HERO, DO  clopidogrel  (PLAVIX ) 75 MG tablet TAKE 1 TABLET BY MOUTH ONCE A DAY. 07/04/24   Croitoru, Mihai, MD  esomeprazole  (NEXIUM ) 20 MG capsule TAKE ONE CAPSULE BY MOUTH EVERY MORNING  07/04/24   Rumball, Alison M, DO  ezetimibe  (ZETIA ) 10 MG tablet TAKE 1 TABLET BY MOUTH ONCE A DAY. 07/04/24   Croitoru, Mihai, MD  famotidine  (PEPCID ) 20 MG tablet TAKE ONE TABLET BY MOUTH TWICE DAILY 07/04/24   Rumball, Alison M, DO  isosorbide  mononitrate (IMDUR ) 120 MG 24 hr tablet TAKE ONE TABLET BY MOUTH ONCE DAILY. 07/04/24   Croitoru, Mihai, MD  nitroGLYCERIN  (NITROSTAT ) 0.4 MG SL tablet Place 1 tablet (0.4 mg total) under the tongue every 5 (five) minutes as needed for chest pain. 04/19/24   Croitoru, Mihai, MD  pramipexole  (MIRAPEX ) 0.5 MG tablet Take 1 tablet (0.5 mg total) by mouth 3 (three) times daily. 7am/11am/4pm 06/09/24   Tat, Asberry RAMAN, DO  ranolazine  (RANEXA ) 500 MG 12 hr tablet Take 1 tablet (500 mg total) by mouth 2 (two) times daily. 08/02/24   Parthenia Olivia HERO, PA-C    Family History Family History  Adopted: Yes  Problem Relation Age of Onset  Cancer Mother        bone; kidney   Cancer Father    Heart attack Father     Social History Social History   Tobacco Use   Smoking status: Former    Current packs/day: 0.00    Average packs/day: 1 pack/day for 20.0 years (20.0 ttl pk-yrs)    Types: Cigarettes    Start date: 11/10/1976    Quit date: 11/10/1996    Years since quitting: 27.7   Smokeless tobacco: Never  Vaping Use   Vaping status: Never Used  Substance Use Topics   Alcohol use: No    Alcohol/week: 0.0 standard drinks of alcohol   Drug use: Not Currently    Comment: prior MJ use; not using now, uses CBD oil - 04/21/18     Allergies   Codeine   Review of Systems Review of Systems Per HPI  Physical Exam Triage Vital Signs ED Triage Vitals  Encounter Vitals Group     BP 08/04/24 1608 122/87     Girls Systolic BP Percentile --      Girls Diastolic BP Percentile --      Boys Systolic BP Percentile --      Boys Diastolic BP Percentile --      Pulse Rate 08/04/24 1608 (!) 59     Resp 08/04/24 1608 20     Temp 08/04/24 1608 97.8 F (36.6 C)     Temp  Source 08/04/24 1608 Oral     SpO2 08/04/24 1608 92 %     Weight --      Height --      Head Circumference --      Peak Flow --      Pain Score 08/04/24 1603 6     Pain Loc --      Pain Education --      Exclude from Growth Chart --    No data found.  Updated Vital Signs BP 122/87 (BP Location: Right Arm)   Pulse (!) 59   Temp 97.8 F (36.6 C) (Oral)   Resp 20   SpO2 92%   Visual Acuity Right Eye Distance:   Left Eye Distance:   Bilateral Distance:    Right Eye Near:   Left Eye Near:    Bilateral Near:     Physical Exam Vitals and nursing note reviewed.  Constitutional:      Appearance: Normal appearance.  HENT:     Head: Atraumatic.     Mouth/Throat:     Mouth: Mucous membranes are moist.  Eyes:     Extraocular Movements: Extraocular movements intact.     Conjunctiva/sclera: Conjunctivae normal.  Cardiovascular:     Rate and Rhythm: Normal rate.  Pulmonary:     Effort: Pulmonary effort is normal.  Musculoskeletal:        General: Normal range of motion.     Cervical back: Normal range of motion and neck supple.     Comments: Negative Homans' sign and squeeze test left lower extremity  Skin:    General: Skin is warm.     Findings: Erythema present.     Comments: Diffuse 1+ pitting edema to left lower leg, anterior lateral left ankle and dorsal left foot erythematous, peeling, abraised and scabbed with surrounding erythema and warmth  Neurological:     Mental Status: He is oriented to person, place, and time.     Motor: No weakness.     Gait: Gait normal.     Comments:  Left lower extremity neurovascularly intact  Psychiatric:        Mood and Affect: Mood normal.        Thought Content: Thought content normal.        Judgment: Judgment normal.      UC Treatments / Results  Labs (all labs ordered are listed, but only abnormal results are displayed) Labs Reviewed - No data to display  EKG   Radiology No results found.  Procedures Procedures  (including critical care time)  Medications Ordered in UC Medications  bacitracin  ointment 1 Application (has no administration in time range)    Initial Impression / Assessment and Plan / UC Course  I have reviewed the triage vital signs and the nursing notes.  Pertinent labs & imaging results that were available during my care of the patient were reviewed by me and considered in my medical decision making (see chart for details).     Wounds cleaned and dressed today in clinic, will treat for cellulitis with Keflex , Hibiclens , Bactroban , good home wound care and discussed compression, elevation and close PCP follow-up regarding the ongoing lower extremity edema.  Patient denies chest pain, shortness of breath, palpitations, dizziness today.  Very low suspicion for DVT.  Final Clinical Impressions(s) / UC Diagnoses   Final diagnoses:  Leg edema, left  Cellulitis of leg, left     Discharge Instructions      Clean the areas at least once a day with the Hibiclens  solution and apply the mupirocin  ointment and nonstick gauze pads with Coban wrap.  Take the course of antibiotics prescribed.  Wear compression stocking daily and prop the leg up at rest to help with the swelling.  Follow-up with your primary care provider for a recheck and further evaluation if not improving    ED Prescriptions     Medication Sig Dispense Auth. Provider   cephALEXin  (KEFLEX ) 500 MG capsule Take 1 capsule (500 mg total) by mouth 2 (two) times daily. 14 capsule Stuart Vernell Norris, PA-C   chlorhexidine  (HIBICLENS ) 4 % external liquid Apply topically daily as needed. 236 mL Stuart Vernell Norris, PA-C   mupirocin  ointment (BACTROBAN ) 2 % Apply 1 Application topically 2 (two) times daily. 60 g Stuart Vernell Norris, [place]      PDMP not reviewed this encounter.   Stuart Vernell Norris, PA-C 08/04/24 1643

## 2024-08-04 NOTE — Discharge Instructions (Signed)
 Clean the areas at least once a day with the Hibiclens  solution and apply the mupirocin  ointment and nonstick gauze pads with Coban wrap.  Take the course of antibiotics prescribed.  Wear compression stocking daily and prop the leg up at rest to help with the swelling.  Follow-up with your primary care provider for a recheck and further evaluation if not improving

## 2024-08-12 ENCOUNTER — Telehealth: Payer: Self-pay

## 2024-08-12 NOTE — Telephone Encounter (Signed)
 Called pt and left message about medicine : pramipexole  0.5 mg tablet        Will file in chart as: pramipexole  (MIRAPEX ) 0.5 MG tablet   Sig: TAKE TWO TABLETS BY MOUTH EVERY MORNING, THEN TWO TABLETS IN THE AFTERNOON, THEN TAKE ONE TABLET EVERY EVENING

## 2024-08-16 ENCOUNTER — Other Ambulatory Visit: Payer: Self-pay

## 2024-08-16 MED ORDER — PRAMIPEXOLE DIHYDROCHLORIDE 0.5 MG PO TABS: 0.5000 mg | ORAL_TABLET | Freq: Three times a day (TID) | ORAL | 1 refills | Status: DC

## 2024-09-27 NOTE — Progress Notes (Signed)
 Assessment/Plan:  Assessment and Plan Assessment & Plan    1.  Parkinsons Disease with levodopa  resistant resistant tremor  -continue carbidopa /levodopa  25/100, 3 at 7am/3 at 11am/2 at 3pm/1 at 7pm  -continue carbidopa /levodopa  50/200 at bed for first AM on  -  Wean off pramipexole : reduce to 0.5 mg twice daily for two weeks, then once daily for two weeks, then discontinue.  -Levodopa  challenge test done May 09, 2022 and patient's tremor responded fairly significantly to levodopa , although not completely.  This was actually a surprise to both of us .    -Patient had neurocognitive testing and I personally talked to Dr. Richie about this.  He stated that, taken at face value, the patient's results showed severe impairment, and we generally would not do DBS in somebody with severe impairment.  However, Dr. Richie felt that the patient had panicked during the test and the test anxiety severely influenced of the results of the test.  Ultimately, patient opted to hold on DBS.  At this point in time, if we decided to reengage, he would need repeat testing given the amount of time that has passed.  - Not interested in DBS.  -Pt and I discussed Vyalev.  We discussed at last visit and he was interested, and I am not exactly sure what happened, but he does not have it currently.  He is still interested in the pump.   Patient has motor fluctuations, including dyskinesia and bradykinesia and has at least 2.5 hours of off time per day.  Patient could certainly benefit from 24 hour/day infusion therapy.  Will see if his insurance will approve this.   2.  Insomnia  -on melatonin  -avoid napping so late and no longer than 1 hour.  -on mirtazapine   3.  Coronary artery disease  -Cardiology noted patient had cardiac disease, but felt okay to proceed with DBS.  Nonetheless, patient and I decided to hold on that for now.  4.  RBD  -citalopram  may be making it worse - discuss with pcp  -may need to add klonopin  in future  5.  Low blood pressure  - Cardiology has decreased the Ranexa , stopped the lisinopril  and noted that they may need to decrease Imdur .  Patient is also still on amiodarone.  He still notes low blood pressure, but notes he is asymptomatic.  He will continue following with cardiology. 6.  LE edema  - Likely from amlodipine , but the left is worse than the right.  He asked me about this today.  I told him he needed to follow-up with primary care.   Subjective:  Discussed the use of AI scribe software for clinical note transcription with the patient, who gave verbal consent to proceed.  History of Present Illness Christopher Weeks is a 73 year old male with Parkinson's disease who presents for follow-up regarding medication management and hallucinations.  He is following up for Parkinson's disease management. His pramipexole  was reduced last visit due to hallucinations. He is currently taking pramipexole  0.5 mg three times a day and carbidopa -levodopa  50/200 mg extended release at bedtime. The patient reports that his hallucinations are worse. He describes seeing shadows while watching TV and feeling like someone is behind him. No tactile hallucinations or seeing full figures.  He experiences approximately 2.5 hours of 'off time' per day where medications are not working well. He confirms taking carbidopa -levodopa  as prescribed, including an early dose at 11 AM due to the appointment.  He mentions recent  weight loss, which he attributes to efforts to stay healthy. He has a history of being a runner and continues to focus on maintaining his health.  He recently received new dentures about a month ago and is adjusting to them with the use of adhesive strips. His daughter has commented positively on his appearance with the new teeth.  He reports swelling in his leg, which decreases when he elevates it but returns after walking for about 30 minutes.  His daughter is living with him to  assist with his care, particularly due to his wife's reluctance to leave the house, which has been a source of stress for him.     Current prescribed movement disorder medications: Carbidopa /levodopa  25/100, 3 at 7am/3 at 11am/2 at 3pm/1 at 7pm carbidopa /levodopa  50/200 CR at bed Pramipexole  0.5 mg, 1 po tid (decrease) mirtazapine  Celexa   PREVIOUS MEDICATIONS: Sinemet  and Mirapex ;  ALLERGIES:   Allergies  Allergen Reactions   Codeine Hives    Denies Airway involvement    CURRENT MEDICATIONS:  Outpatient Encounter Medications as of 09/29/2024  Medication Sig   amLODipine  (NORVASC ) 5 MG tablet Take 1 tablet (5 mg total) by mouth daily.   aspirin  81 MG tablet Take 81 mg by mouth daily.   atorvastatin  (LIPITOR ) 80 MG tablet Take 1 tablet (80 mg total) by mouth daily.   Calcium -Vitamin D  600-125 MG-UNIT TABS Take 1 tablet by mouth daily.    carbidopa -levodopa  (SINEMET  CR) 50-200 MG tablet Take 1 tablet by mouth at bedtime.   carbidopa -levodopa  (SINEMET  IR) 25-100 MG tablet TAKE 3 TABLETS BY MOUTH AT 7AM, 3 TABLETS AT 11AM, 2 TABLETS AT 3PM, AND 1 TABLET AT 7PM.   chlorhexidine  (HIBICLENS ) 4 % external liquid Apply topically daily as needed.   citalopram  (CELEXA ) 40 MG tablet Take 1 tablet (40 mg total) by mouth daily.   clopidogrel  (PLAVIX ) 75 MG tablet TAKE 1 TABLET BY MOUTH ONCE A DAY.   esomeprazole  (NEXIUM ) 20 MG capsule TAKE ONE CAPSULE BY MOUTH EVERY MORNING   ezetimibe  (ZETIA ) 10 MG tablet TAKE 1 TABLET BY MOUTH ONCE A DAY.   famotidine  (PEPCID ) 20 MG tablet TAKE ONE TABLET BY MOUTH TWICE DAILY   isosorbide  mononitrate (IMDUR ) 120 MG 24 hr tablet TAKE ONE TABLET BY MOUTH ONCE DAILY.   nitroGLYCERIN  (NITROSTAT ) 0.4 MG SL tablet Place 1 tablet (0.4 mg total) under the tongue every 5 (five) minutes as needed for chest pain.   pramipexole  (MIRAPEX ) 0.5 MG tablet Take 1 tablet (0.5 mg total) by mouth 3 (three) times daily. 7am/11am/4pm   ranolazine  (RANEXA ) 500 MG 12 hr tablet Take  1 tablet (500 mg total) by mouth 2 (two) times daily.   cephALEXin  (KEFLEX ) 500 MG capsule Take 1 capsule (500 mg total) by mouth 2 (two) times daily.   mupirocin  ointment (BACTROBAN ) 2 % Apply 1 Application topically 2 (two) times daily.   No facility-administered encounter medications on file as of 09/29/2024.    Objective:   PHYSICAL EXAMINATION:    VITALS:   Vitals:   09/29/24 1058  BP: (!) 102/50  Pulse: (!) 47  SpO2: 97%  Weight: 219 lb (99.3 kg)  Height: 6' (1.829 m)     GEN:  The patient appears stated age and is in NAD. HEENT:  Normocephalic, atraumatic.  The mucous membranes are moist.  Patient does have new dentures CV: Bradycardic.  Regular. Lungs: CTAB  Neurological examination:  Orientation: The patient is alert and oriented x3. Cranial nerves: There is good facial symmetry  with mild facial hypomimia. The speech is fluent and clear. Soft palate rises symmetrically and there is no tongue deviation. Hearing is intact to conversational tone. Sensation: Sensation is intact to light touch throughout Motor: Strength is at least antigravity x4.   Movement examination: Tone: mild increased tone in the LUE/LLE Abnormal movements: foot dyskinesia on the R;  Coordination:  There is mild decremation with RAM's, only with toe taps on the L Gait and Station: The patient has mild difficulty arising out of a deep-seated chair without the use of the hands. Some initial start hesitation but does well with stride length in [place].  He is forward flexed  I have reviewed and interpreted the following labs independently    Chemistry      Component Value Date/Time   NA 137 10/26/2023 1212   NA 143 03/12/2023 1725   K 4.1 10/26/2023 1212   CL 103 10/26/2023 1212   CO2 26 10/26/2023 1212   BUN 23 10/26/2023 1212   BUN 20 03/12/2023 1725   CREATININE 0.93 10/26/2023 1212   CREATININE 0.91 01/31/2016 1136      Component Value Date/Time   CALCIUM  9.5 10/26/2023 1212    ALKPHOS 78 10/26/2023 1212   AST 19 10/26/2023 1212   ALT 7 10/26/2023 1212   BILITOT 0.9 10/26/2023 1212   BILITOT 0.5 06/16/2022 1210       Lab Results  Component Value Date   WBC 9.7 02/23/2024   HGB 12.0 (L) 02/23/2024   HCT 36.0 (L) 02/23/2024   MCV 98 (H) 02/23/2024   PLT 255 02/23/2024    Lab Results  Component Value Date   TSH 1.80 08/30/2019     Total time spent on today's visit was 40 minutes, including both face-to-face time and nonface-to-face time.  Time included that spent on review of records (prior notes available to me/labs/imaging if pertinent), discussing treatment and goals, answering patient's questions and coordinating care.  This did not include the time spent waiting for levodopa  to kick in.  Cc:  Rumball, Alison M, DO

## 2024-09-29 ENCOUNTER — Ambulatory Visit: Admitting: Neurology

## 2024-09-29 ENCOUNTER — Encounter: Payer: Self-pay | Admitting: Neurology

## 2024-09-29 VITALS — BP 102/50 | HR 47 | Ht 72.0 in | Wt 219.0 lb

## 2024-09-29 DIAGNOSIS — R441 Visual hallucinations: Secondary | ICD-10-CM

## 2024-09-29 DIAGNOSIS — G20B1 Parkinson's disease with dyskinesia, without mention of fluctuations: Secondary | ICD-10-CM | POA: Diagnosis not present

## 2024-09-29 DIAGNOSIS — R600 Localized edema: Secondary | ICD-10-CM

## 2024-09-29 NOTE — Patient Instructions (Addendum)
  VISIT SUMMARY: Today, you came in for a follow-up appointment to discuss your Parkinson's disease management and the recent increase in hallucinations. We reviewed your current medications and discussed the next steps to help manage your symptoms.  YOUR PLAN: -PARKINSON'S DISEASE WITH MEDICATION-INDUCED HALLUCINATIONS AND LEVODOPA -RESISTANT TREMOR: Parkinson's disease is a disorder of the nervous system that affects movement, often including tremors. Your current medication, pramipexole , has been causing visual hallucinations, and you experience about two hours of 'off time' daily when your medications are not working well. We will gradually reduce your pramipexole  dose to 0.5 mg twice daily for two weeks, then once daily for two weeks, and then discontinue it. You should continue your current carbidopa -levodopa  regimen. We are also working on getting approval for the Vyalev pump, which may help manage your symptoms better. If approved, we will arrange for a nurse to demonstrate its use and start the first infusion in the office.  INSTRUCTIONS: Please follow the new pramipexole  dosing schedule: reduce to 0.5 mg twice daily for two weeks, then once daily for two weeks, and then discontinue. Continue taking your carbidopa -levodopa  as prescribed. We will follow up on the insurance approval for the Vyalev pump and let you know the next steps. If you have any questions or notice any changes in your symptoms, please contact our office.                      Contains text generated by [employer]bridge.                                 Contains text generated by [employer]bridge.

## 2024-10-20 ENCOUNTER — Encounter: Payer: Self-pay | Admitting: Family Medicine

## 2024-10-31 ENCOUNTER — Other Ambulatory Visit: Payer: Self-pay | Admitting: Family Medicine

## 2024-10-31 DIAGNOSIS — F321 Major depressive disorder, single episode, moderate: Secondary | ICD-10-CM

## 2024-11-01 ENCOUNTER — Telehealth: Payer: Self-pay | Admitting: Urology

## 2024-11-01 ENCOUNTER — Encounter (HOSPITAL_COMMUNITY): Payer: Self-pay | Admitting: Emergency Medicine

## 2024-11-01 ENCOUNTER — Other Ambulatory Visit: Payer: Self-pay

## 2024-11-01 ENCOUNTER — Emergency Department (HOSPITAL_COMMUNITY)
Admission: EM | Admit: 2024-11-01 | Discharge: 2024-11-01 | Disposition: A | Discharge status: Home / Self Care | Attending: Emergency Medicine | Admitting: Emergency Medicine

## 2024-11-01 ENCOUNTER — Emergency Department (HOSPITAL_COMMUNITY)

## 2024-11-01 DIAGNOSIS — N201 Calculus of ureter: Secondary | ICD-10-CM | POA: Insufficient documentation

## 2024-11-01 DIAGNOSIS — R1031 Right lower quadrant pain: Secondary | ICD-10-CM | POA: Diagnosis present

## 2024-11-01 DIAGNOSIS — Z79899 Other long term (current) drug therapy: Secondary | ICD-10-CM | POA: Insufficient documentation

## 2024-11-01 LAB — CBC
HCT: 44.3 % (ref 39.0–52.0)
Hemoglobin: 15.1 g/dL (ref 13.0–17.0)
MCH: 31.7 pg (ref 26.0–34.0)
MCHC: 34.1 g/dL (ref 30.0–36.0)
MCV: 92.9 fL (ref 80.0–100.0)
Platelets: 288 K/uL (ref 150–400)
RBC: 4.77 MIL/uL (ref 4.22–5.81)
RDW: 13.3 % (ref 11.5–15.5)
WBC: 12.5 K/uL — ABNORMAL HIGH (ref 4.0–10.5)
nRBC: 0 % (ref 0.0–0.2)

## 2024-11-01 LAB — URINALYSIS, ROUTINE W REFLEX MICROSCOPIC
Bacteria, UA: NONE SEEN
Bilirubin Urine: NEGATIVE
Glucose, UA: NEGATIVE mg/dL
Ketones, ur: 5 mg/dL — AB
Nitrite: NEGATIVE
Protein, ur: 30 mg/dL — AB
Specific Gravity, Urine: >1.046 — ABNORMAL HIGH (ref 1.005–1.030)
pH: 5 (ref 5.0–8.0)

## 2024-11-01 LAB — COMPREHENSIVE METABOLIC PANEL WITH GFR
ALT: 15 U/L (ref 0–44)
AST: 23 U/L (ref 15–41)
Albumin: 4.9 g/dL (ref 3.5–5.0)
Alkaline Phosphatase: 135 U/L — ABNORMAL HIGH (ref 38–126)
Anion gap: 13 (ref 5–15)
BUN: 18 mg/dL (ref 8–23)
CO2: 27 mmol/L (ref 22–32)
Calcium: 10.3 mg/dL (ref 8.9–10.3)
Chloride: 101 mmol/L (ref 98–111)
Creatinine, Ser: 1.03 mg/dL (ref 0.61–1.24)
GFR, Estimated: >60 mL/min
Glucose, Bld: 176 mg/dL — ABNORMAL HIGH (ref 70–99)
Potassium: 3.9 mmol/L (ref 3.5–5.1)
Sodium: 141 mmol/L (ref 135–145)
Total Bilirubin: 1.3 mg/dL — ABNORMAL HIGH (ref 0.0–1.2)
Total Protein: 8 g/dL (ref 6.5–8.1)

## 2024-11-01 LAB — LIPASE, BLOOD: Lipase: 19 U/L (ref 11–51)

## 2024-11-01 MED ORDER — HYDROMORPHONE HCL 1 MG/ML IJ SOLN
0.5000 mg | Freq: Once | INTRAMUSCULAR | Status: AC
  Administered 2024-11-01: 0.5 mg via INTRAVENOUS
  Filled 2024-11-01: qty 0.5

## 2024-11-01 MED ORDER — HYDROCODONE-ACETAMINOPHEN 5-325 MG PO TABS: 1.0000 | ORAL_TABLET | Freq: Four times a day (QID) | ORAL | 0 refills | Status: DC | PRN

## 2024-11-01 MED ORDER — TAMSULOSIN HCL 0.4 MG PO CAPS: 0.4000 mg | ORAL_CAPSULE | Freq: Every day | ORAL | 0 refills | Status: AC

## 2024-11-01 MED ORDER — IOHEXOL 300 MG/ML  SOLN
100.0000 mL | Freq: Once | INTRAMUSCULAR | Status: AC | PRN
  Administered 2024-11-01: 100 mL via INTRAVENOUS

## 2024-11-01 MED ORDER — ONDANSETRON HCL 4 MG/2ML IJ SOLN
4.0000 mg | Freq: Once | INTRAMUSCULAR | Status: AC
  Administered 2024-11-01: 4 mg via INTRAVENOUS
  Filled 2024-11-01: qty 2

## 2024-11-01 MED ORDER — LACTATED RINGERS IV BOLUS
1000.0000 mL | Freq: Once | INTRAVENOUS | Status: AC
  Administered 2024-11-01: 1000 mL via INTRAVENOUS

## 2024-11-01 MED ORDER — HYDROMORPHONE HCL 1 MG/ML IJ SOLN
0.5000 mg | Freq: Once | INTRAMUSCULAR | Status: AC | PRN
  Administered 2024-11-01: 0.5 mg via INTRAVENOUS
  Filled 2024-11-01: qty 0.5

## 2024-11-01 NOTE — Telephone Encounter (Signed)
 New patient seen in ER for stone in ureter to follow up with urology

## 2024-11-01 NOTE — ED Provider Notes (Signed)
 " [place] EMERGENCY DEPARTMENT AT [facility] Provider Note   CSN: [id number] Arrival date & time: 11/01/24  9358     Patient presents with: Abdominal Pain   Christopher Weeks is a 73 y.o. male.   HPI 73 year old male presents with right lower quadrant abdominal pain and concern for kidney stones.  Has had about 2 days of pain.  The pain comes and goes.  No back pain.  He has noticed a little bit of blood this morning when urinating.  He has been wearing a diaper because he cannot seem to control his urine which has made him drink less to stop wetting himself is much.  Maybe some dysuria.  He has felt hot with some hot flashes but no fevers.  He did have some vomiting yesterday and is nauseated.  He has a prior history of kidney stones and feels like this is similar.  Prior to Admission medications  Medication Sig Start Date End Date Taking? Authorizing Provider  HYDROcodone -acetaminophen  (NORCO/VICODIN) 5-325 MG tablet Take 1 tablet by mouth every 6 (six) hours as needed for severe pain (pain score 7-10). 11/01/24  Yes Freddi Hamilton, MD  tamsulosin  (FLOMAX ) 0.4 MG CAPS capsule Take 1 capsule (0.4 mg total) by mouth daily. 11/01/24  Yes Freddi Hamilton, MD  amLODipine  (NORVASC ) 5 MG tablet Take 1 tablet (5 mg total) by mouth daily. 11/27/23 09/29/24  Rumball, Alison M, DO  aspirin  81 MG tablet Take 81 mg by mouth daily.    [provider]  atorvastatin  (LIPITOR ) 80 MG tablet TAKE ONE TABLET BY MOUTH EVERY DAY 10/31/24   Donah Laymon PARAS, MD  Calcium -Vitamin D  600-125 MG-UNIT TABS Take 1 tablet by mouth daily.     [provider]  carbidopa -levodopa  (SINEMET  CR) 50-200 MG tablet Take 1 tablet by mouth at bedtime. 06/09/24   Tat, Asberry RAMAN, DO  carbidopa -levodopa  (SINEMET  IR) 25-100 MG tablet TAKE 3 TABLETS BY MOUTH AT 7AM, 3 TABLETS AT 11AM, 2 TABLETS AT 3PM, AND 1 TABLET AT 7PM. 06/09/24   Tat, Asberry RAMAN, DO  cephALEXin  (KEFLEX ) 500 MG capsule Take 1 capsule (500  mg total) by mouth 2 (two) times daily. 08/04/24   Stuart Vernell Norris, PA-C  chlorhexidine  (HIBICLENS ) 4 % external liquid Apply topically daily as needed. 08/04/24   Stuart Vernell Norris, PA-C  citalopram  (CELEXA ) 40 MG tablet TAKE ONE TABLET BY MOUTH EVERY DAY 10/31/24   Donah Laymon PARAS, MD  clopidogrel  (PLAVIX ) 75 MG tablet TAKE 1 TABLET BY MOUTH ONCE A DAY. 07/04/24   Croitoru, Mihai, MD  esomeprazole  (NEXIUM ) 20 MG capsule TAKE ONE CAPSULE BY MOUTH EVERY MORNING 07/04/24   Rumball, Alison M, DO  ezetimibe  (ZETIA ) 10 MG tablet TAKE 1 TABLET BY MOUTH ONCE A DAY. 07/04/24   Croitoru, Mihai, MD  famotidine  (PEPCID ) 20 MG tablet TAKE ONE TABLET BY MOUTH TWICE DAILY 07/04/24   Rumball, Alison M, DO  isosorbide  mononitrate (IMDUR ) 120 MG 24 hr tablet TAKE ONE TABLET BY MOUTH ONCE DAILY. 07/04/24   Croitoru, Mihai, MD  mupirocin  ointment (BACTROBAN ) 2 % Apply 1 Application topically 2 (two) times daily. 08/04/24   Stuart Vernell Norris, PA-C  nitroGLYCERIN  (NITROSTAT ) 0.4 MG SL tablet Place 1 tablet (0.4 mg total) under the tongue every 5 (five) minutes as needed for chest pain. 04/19/24   Croitoru, Mihai, MD  ranolazine  (RANEXA ) 500 MG 12 hr tablet Take 1 tablet (500 mg total) by mouth 2 (two) times daily. 08/02/24   Parthenia Olivia HERO,  PA-C    Allergies: Codeine    Review of Systems  Constitutional:  Negative for fever.  Gastrointestinal:  Positive for abdominal pain, nausea and vomiting.  Genitourinary:  Positive for hematuria.  Musculoskeletal:  Negative for back pain.    Updated Vital Signs BP (!) 178/91   Pulse 97   Temp 99.5 F (37.5 C) (Oral)   Resp 19   Ht 6' (1.829 m)   Wt 95.7 kg   SpO2 93%   BMI 28.62 kg/m   Physical Exam Vitals and nursing note reviewed.  Constitutional:      Appearance: He is well-developed.  HENT:     Head: Normocephalic and atraumatic.  Pulmonary:     Effort: Pulmonary effort is normal.  Abdominal:     Palpations: Abdomen is soft.      Tenderness: There is abdominal tenderness in the right lower quadrant. There is no right CVA tenderness or left CVA tenderness.  Skin:    General: Skin is warm and dry.  Neurological:     Mental Status: He is alert.     (all labs ordered are listed, but only abnormal results are displayed) Labs Reviewed  COMPREHENSIVE METABOLIC PANEL WITH GFR - Abnormal; Notable for the following components:      Result Value   Glucose, Bld 176 (*)    Alkaline Phosphatase 135 (*)    Total Bilirubin 1.3 (*)    All other components within normal limits  CBC - Abnormal; Notable for the following components:   WBC 12.5 (*)    All other components within normal limits  URINALYSIS, ROUTINE W REFLEX MICROSCOPIC - Abnormal; Notable for the following components:   Specific Gravity, Urine >1.046 (*)    Hgb urine dipstick MODERATE (*)    Ketones, ur 5 (*)    Protein, ur 30 (*)    Leukocytes,Ua TRACE (*)    All other components within normal limits  LIPASE, BLOOD    EKG: None  Radiology: CT ABDOMEN PELVIS W CONTRAST Result Date: 11/01/2024 EXAM: CT ABDOMEN AND PELVIS WITH CONTRAST 11/01/2024 09:08:48 AM TECHNIQUE: CT of the abdomen and pelvis was performed with the administration of 100 mL of iohexol  (OMNIPAQUE ) 300 MG/ML solution. Multiplanar reformatted images are provided for review. Automated exposure control, iterative reconstruction, and/or weight-based adjustment of the mA/kV was utilized to reduce the radiation dose to as low as reasonably achievable. COMPARISON: 04/15/2018 CLINICAL HISTORY: RLQ abdominal pain. FINDINGS: LOWER CHEST: Posterior bibasilar dependent atelectasis. LIVER: The liver is unremarkable. GALLBLADDER AND BILE DUCTS: Multiple small gallstones in the gallbladder. No biliary ductal dilatation. SPLEEN: No acute abnormality. PANCREAS: No acute abnormality. ADRENAL GLANDS: Unchanged nodularity of the left adrenal gland. Given the stability, this is likely benign. KIDNEYS, URETERS AND  BLADDER: Similar appearance of multiple small renal cysts. Moderate right sided hydroureteronephrosis due to an obstructive calculus in the distal right ureter, which measures 4 x 4 x 7 mm. No stones in the left kidney or ureter. No left hydronephrosis. No perinephric or periureteral stranding. Urinary bladder is unremarkable. GI AND BOWEL: Moderate sized hiatal hernia. Stomach demonstrates no acute abnormality. Scattered colonic diverticulosis. Normal appendix. There is no bowel obstruction. PERITONEUM AND RETROPERITONEUM: No ascites. No free air. No free pelvic fluid. VASCULATURE: Dense right coronary artery atherosclerosis. Aorta is normal in caliber. LYMPH NODES: No lymphadenopathy. REPRODUCTIVE ORGANS: No acute abnormality. BONES AND SOFT TISSUES: Osteopenia. 2 level degenerative disc disease of the spine. Mild bilateral hip osteoarthritis. No acute osseous abnormality. No focal soft tissue  abnormality. IMPRESSION: 1. Moderate right sided hydroureteronephrosis due to an obstructive calculus in the distal right ureter, measuring 4 x 4 x 7 mm. 2. Cholecystolithiasis. No changes or acute cholecystitis. 3. Moderate-sized sliding type hiatal hernia. Scattered colonic diverticulosis. Electronically signed by: Rogelia Myers MD 11/01/2024 09:39 AM EST RP Workstation: [id number]     Procedures   Medications Ordered in the ED  lactated ringers  bolus 1,000 mL (0 mLs Intravenous Stopped 11/01/24 0857)  HYDROmorphone  (DILAUDID ) injection 0.5 mg (0.5 mg Intravenous Given 11/01/24 0731)  ondansetron  (ZOFRAN ) injection 4 mg (4 mg Intravenous Given 11/01/24 0731)  HYDROmorphone  (DILAUDID ) injection 0.5 mg (0.5 mg Intravenous Given 11/01/24 0807)  iohexol  (OMNIPAQUE ) 300 MG/ML solution 100 mL (100 mLs Intravenous Contrast Given 11/01/24 0854)                                    Medical Decision Making Amount and/or Complexity of Data Reviewed Labs: ordered.    Details: Mild leukocytosis.  Normal  creatinine Radiology: ordered and independent interpretation performed.    Details: Right ureteral stone  Risk Prescription drug management.   Patient presents with right-sided pain consistent with a right ureteral stone.  He is feeling better after some IV pain medicine and fluids.  He was able to give a urine sample which has mostly blood, and interpretation is not consistent with UTI.  He feels like his pain is well-controlled now and feels well enough for home.  Will give pain control and based on the size will give Flomax .  Will have him follow-up with urology.     Final diagnoses:  Right ureteral stone    ED Discharge Orders          Ordered    HYDROcodone -acetaminophen  (NORCO/VICODIN) 5-325 MG tablet  Every 6 hours PRN        11/01/24 1042    tamsulosin  (FLOMAX ) 0.4 MG CAPS capsule  Daily        11/01/24 1042               Freddi Hamilton, MD 11/01/24 1457  "

## 2024-11-01 NOTE — ED Triage Notes (Signed)
 Pt presents with lower groin pain and frequent urination for appox 2 weeks, history of kidney stones.

## 2024-11-01 NOTE — ED Notes (Signed)
 Patient transported to CT

## 2024-11-01 NOTE — ED Notes (Signed)
 Patient urinated in his brief while at CT so not able to obtain urine specimen.

## 2024-11-01 NOTE — Telephone Encounter (Signed)
 Offered appt today but they are going to wait and see if he passes stone because ER told him a week. They will call back Monday if needed

## 2024-11-01 NOTE — Discharge Instructions (Addendum)
 Your CT scan shows a kidney stone in your right ureter causing your symptoms.  Use ibuprofen and Tylenol  for pain.  We are starting you on Flomax  to help the stone pass.  If you continue to have pain or develop severe/breakthrough pain you may take the hydrocodone .  Do not combine with other meds or drive or operate heavy machinery with this medicine.  Do not take alcohol with this.  Follow-up with the urologist, call their office today.  However, if you develop fever, uncontrolled pain or vomiting, or any other new/concerning symptoms then return to the ER.

## 2024-11-06 ENCOUNTER — Encounter (HOSPITAL_COMMUNITY): Payer: Self-pay

## 2024-11-06 ENCOUNTER — Emergency Department (HOSPITAL_COMMUNITY)
Admission: EM | Admit: 2024-11-06 | Discharge: 2024-11-06 | Disposition: A | Discharge status: Home / Self Care | Attending: Emergency Medicine | Admitting: Emergency Medicine

## 2024-11-06 ENCOUNTER — Emergency Department (HOSPITAL_COMMUNITY)

## 2024-11-06 ENCOUNTER — Other Ambulatory Visit: Payer: Self-pay

## 2024-11-06 DIAGNOSIS — Z7902 Long term (current) use of antithrombotics/antiplatelets: Secondary | ICD-10-CM | POA: Insufficient documentation

## 2024-11-06 DIAGNOSIS — Z7982 Long term (current) use of aspirin: Secondary | ICD-10-CM | POA: Insufficient documentation

## 2024-11-06 DIAGNOSIS — R1031 Right lower quadrant pain: Secondary | ICD-10-CM | POA: Diagnosis present

## 2024-11-06 DIAGNOSIS — G20C Parkinsonism, unspecified: Secondary | ICD-10-CM | POA: Insufficient documentation

## 2024-11-06 DIAGNOSIS — N200 Calculus of kidney: Secondary | ICD-10-CM | POA: Insufficient documentation

## 2024-11-06 LAB — CBC WITH DIFFERENTIAL/PLATELET
Abs Immature Granulocytes: 0.04 K/uL (ref 0.00–0.07)
Basophils Absolute: 0 K/uL (ref 0.0–0.1)
Basophils Relative: 0 %
Eosinophils Absolute: 0 K/uL (ref 0.0–0.5)
Eosinophils Relative: 0 %
HCT: 41.5 % (ref 39.0–52.0)
Hemoglobin: 14.2 g/dL (ref 13.0–17.0)
Immature Granulocytes: 0 %
Lymphocytes Relative: 5 %
Lymphs Abs: 0.5 K/uL — ABNORMAL LOW (ref 0.7–4.0)
MCH: 31.8 pg (ref 26.0–34.0)
MCHC: 34.2 g/dL (ref 30.0–36.0)
MCV: 92.8 fL (ref 80.0–100.0)
Monocytes Absolute: 0.6 K/uL (ref 0.1–1.0)
Monocytes Relative: 6 %
Neutro Abs: 9 K/uL — ABNORMAL HIGH (ref 1.7–7.7)
Neutrophils Relative %: 89 %
Platelets: 246 K/uL (ref 150–400)
RBC: 4.47 MIL/uL (ref 4.22–5.81)
RDW: 13.4 % (ref 11.5–15.5)
WBC: 10.2 K/uL (ref 4.0–10.5)
nRBC: 0 % (ref 0.0–0.2)

## 2024-11-06 LAB — URINALYSIS, ROUTINE W REFLEX MICROSCOPIC
Bacteria, UA: NONE SEEN
Bilirubin Urine: NEGATIVE
Glucose, UA: NEGATIVE mg/dL
Ketones, ur: 20 mg/dL — AB
Nitrite: NEGATIVE
Protein, ur: 100 mg/dL — AB
RBC / HPF: >50 RBC/hpf (ref 0–5)
Specific Gravity, Urine: 1.019 (ref 1.005–1.030)
pH: 5 (ref 5.0–8.0)

## 2024-11-06 LAB — BASIC METABOLIC PANEL WITH GFR
Anion gap: 18 — ABNORMAL HIGH (ref 5–15)
BUN: 18 mg/dL (ref 8–23)
CO2: 22 mmol/L (ref 22–32)
Calcium: 9.5 mg/dL (ref 8.9–10.3)
Chloride: 101 mmol/L (ref 98–111)
Creatinine, Ser: 0.84 mg/dL (ref 0.61–1.24)
GFR, Estimated: >60 mL/min
Glucose, Bld: 136 mg/dL — ABNORMAL HIGH (ref 70–99)
Potassium: 3.1 mmol/L — ABNORMAL LOW (ref 3.5–5.1)
Sodium: 141 mmol/L (ref 135–145)

## 2024-11-06 MED ORDER — KETOROLAC TROMETHAMINE 15 MG/ML IJ SOLN
15.0000 mg | Freq: Once | INTRAMUSCULAR | Status: AC
  Administered 2024-11-06: 15 mg via INTRAVENOUS
  Filled 2024-11-06: qty 1

## 2024-11-06 MED ORDER — HYDROMORPHONE HCL 1 MG/ML IJ SOLN
0.5000 mg | Freq: Once | INTRAMUSCULAR | Status: AC
  Administered 2024-11-06: 0.5 mg via INTRAVENOUS
  Filled 2024-11-06: qty 0.5

## 2024-11-06 MED ORDER — OXYCODONE-ACETAMINOPHEN 5-325 MG PO TABS: 1.0000 | ORAL_TABLET | Freq: Three times a day (TID) | ORAL | 0 refills | Status: AC | PRN

## 2024-11-06 MED ORDER — ONDANSETRON HCL 4 MG/2ML IJ SOLN
4.0000 mg | Freq: Once | INTRAMUSCULAR | Status: AC
  Administered 2024-11-06: 4 mg via INTRAVENOUS
  Filled 2024-11-06: qty 2

## 2024-11-06 NOTE — ED Triage Notes (Signed)
 Pt [facility] EMS, c/o groin pain; onset 4x days ago; seen previously at [facility] ED for same thing; pt unable to ambulate to truck d/t pain; rates pain 8/10; hx of parkinson's; hx of blood thinners; A&Ox4

## 2024-11-06 NOTE — ED Provider Notes (Signed)
 " [place] EMERGENCY DEPARTMENT AT [facility] Provider Note   CSN: [id number] Arrival date & time: 11/06/24  1350     Patient presents with: Groin Pain   Christopher Weeks is a 73 y.o. male.    Groin Pain  Patient with right abdominal pain and groin pain.  Around 4 days.  Seen in the ER 5 days ago and diagnosed with kidney stone.  States he used pain meds with pain control but has run out.  May have had some fevers.  Pain has not changed position.     Prior to Admission medications  Medication Sig Start Date End Date Taking? Authorizing Provider  oxyCODONE -acetaminophen  (PERCOCET/ROXICET) 5-325 MG tablet Take 1-2 tablets by mouth every 8 (eight) hours as needed for severe pain (pain score 7-10). 11/06/24  Yes Patsey Lot, MD  amLODipine  (NORVASC ) 5 MG tablet Take 1 tablet (5 mg total) by mouth daily. 11/27/23 09/29/24  Rumball, Alison M, DO  aspirin  81 MG tablet Take 81 mg by mouth daily.    [provider]  atorvastatin  (LIPITOR ) 80 MG tablet TAKE ONE TABLET BY MOUTH EVERY DAY 10/31/24   Donah Laymon PARAS, MD  Calcium -Vitamin D  600-125 MG-UNIT TABS Take 1 tablet by mouth daily.     [provider]  carbidopa -levodopa  (SINEMET  CR) 50-200 MG tablet Take 1 tablet by mouth at bedtime. 06/09/24   Tat, Asberry RAMAN, DO  carbidopa -levodopa  (SINEMET  IR) 25-100 MG tablet TAKE 3 TABLETS BY MOUTH AT 7AM, 3 TABLETS AT 11AM, 2 TABLETS AT 3PM, AND 1 TABLET AT 7PM. 06/09/24   Tat, Asberry RAMAN, DO  cephALEXin  (KEFLEX ) 500 MG capsule Take 1 capsule (500 mg total) by mouth 2 (two) times daily. 08/04/24   Stuart Vernell Norris, PA-C  chlorhexidine  (HIBICLENS ) 4 % external liquid Apply topically daily as needed. 08/04/24   Stuart Vernell Norris, PA-C  citalopram  (CELEXA ) 40 MG tablet TAKE ONE TABLET BY MOUTH EVERY DAY 10/31/24   Donah Laymon PARAS, MD  clopidogrel  (PLAVIX ) 75 MG tablet TAKE 1 TABLET BY MOUTH ONCE A DAY. 07/04/24   Croitoru, Mihai, MD  esomeprazole  (NEXIUM ) 20  MG capsule TAKE ONE CAPSULE BY MOUTH EVERY MORNING 07/04/24   Rumball, Alison M, DO  ezetimibe  (ZETIA ) 10 MG tablet TAKE 1 TABLET BY MOUTH ONCE A DAY. 07/04/24   Croitoru, Mihai, MD  famotidine  (PEPCID ) 20 MG tablet TAKE ONE TABLET BY MOUTH TWICE DAILY 07/04/24   Rumball, Alison M, DO  isosorbide  mononitrate (IMDUR ) 120 MG 24 hr tablet TAKE ONE TABLET BY MOUTH ONCE DAILY. 07/04/24   Croitoru, Mihai, MD  mupirocin  ointment (BACTROBAN ) 2 % Apply 1 Application topically 2 (two) times daily. 08/04/24   Stuart Vernell Norris, PA-C  nitroGLYCERIN  (NITROSTAT ) 0.4 MG SL tablet Place 1 tablet (0.4 mg total) under the tongue every 5 (five) minutes as needed for chest pain. 04/19/24   Croitoru, Mihai, MD  ranolazine  (RANEXA ) 500 MG 12 hr tablet Take 1 tablet (500 mg total) by mouth 2 (two) times daily. 08/02/24   Parthenia Olivia HERO, PA-C  tamsulosin  (FLOMAX ) 0.4 MG CAPS capsule Take 1 capsule (0.4 mg total) by mouth daily. 11/01/24   Freddi Hamilton, MD    Allergies: Codeine    Review of Systems  Updated Vital Signs BP (!) 154/99 (BP Location: Right Arm)   Pulse 95   Temp 98.3 F (36.8 C) (Oral)   Resp 18   Ht 6' (1.829 m)   Wt 97.5 kg   SpO2 97%   BMI  29.16 kg/m   Physical Exam Vitals reviewed.  Cardiovascular:     Rate and Rhythm: Normal rate.  Abdominal:     Tenderness: There is no abdominal tenderness.  Skin:    General: Skin is warm.  Neurological:     Mental Status: He is alert.     (all labs ordered are listed, but only abnormal results are displayed) Labs Reviewed  BASIC METABOLIC PANEL WITH GFR - Abnormal; Notable for the following components:      Result Value   Potassium 3.1 (*)    Glucose, Bld 136 (*)    Anion gap 18 (*)    All other components within normal limits  CBC WITH DIFFERENTIAL/PLATELET - Abnormal; Notable for the following components:   Neutro Abs 9.0 (*)    Lymphs Abs 0.5 (*)    All other components within normal limits  URINALYSIS, ROUTINE W REFLEX MICROSCOPIC  - Abnormal; Notable for the following components:   Hgb urine dipstick MODERATE (*)    Ketones, ur 20 (*)    Protein, ur 100 (*)    Leukocytes,Ua SMALL (*)    All other components within normal limits    EKG: None  Radiology: DG Abdomen 1 View Result Date: 11/06/2024 CLINICAL DATA:  Kidney stone. EXAM: DG ABDOMEN 1V COMPARISON:  CT 11/01/2024 FINDINGS: The known distal right ureteral calculus is potentially visualized in the pelvis. No stones project over the renal beds. Normal bowel gas pattern. Small volume of formed stool in the colon. Bilateral hip degenerative change. IMPRESSION: Known distal right ureteral calculus is potentially visualized in the pelvis, unchanged in position. Electronically Signed   By: Andrea Gasman M.D.   On: 11/06/2024 16:35     Procedures   Medications Ordered in the ED  HYDROmorphone  (DILAUDID ) injection 0.5 mg (0.5 mg Intravenous Given 11/06/24 1507)  ondansetron  (ZOFRAN ) injection 4 mg (4 mg Intravenous Given 11/06/24 1505)  ketorolac  (TORADOL ) 15 MG/ML injection 15 mg (15 mg Intravenous Given 11/06/24 1756)                                    Medical Decision Making Amount and/or Complexity of Data Reviewed Labs: ordered. Radiology: ordered.  Risk Prescription drug management.   Patient was recently diagnosed.  Pain Continues in the Groin.  Had been controlled with pain medicines but now out.  Also question fever.  This would worry for infection.  Will get basic blood work.  Will get a KUB to see if we can see the position of the stone.  Also will get urinalysis.  Will give symptomatic treatment for pain.  Urinalysis reassuring.  Blood work reassuring.  KUB shows likely stone in the same position.  Feeling better after treatment.  Will discharge home with continued pain medicine.  Follow-up with urology.     Final diagnoses:  Kidney stone    ED Discharge Orders          Ordered    oxyCODONE -acetaminophen  (PERCOCET/ROXICET) 5-325 MG  tablet  Every 8 hours PRN        11/06/24 1919               Patsey Lot, MD 11/06/24 2316  "

## 2024-11-08 NOTE — Progress Notes (Unsigned)
 "   Chief Complaint: Kidney stone   History of Present Illness:  Christopher Weeks is a 73 y.o. male who is seen in consultation from Rumball, Alison M, DO for evaluation of ***.   Past Medical History:  Past Medical History:  Diagnosis Date   CAD S/P percutaneous coronary angioplasty 07/13/2012   CAD s/p DES RCA (07/2012 - Promus Element 3.0x32) Cath/ PCI to RI 05/15/2019 (unable to place stent). residual CAD of CFX- medical Rx   Coronary artery disease of native artery of native heart with stable angina pectoris 05/12/2019   DES PCI (3x30 2 mm Promus) mid RCA; July 2020 PTCA only of proximal Ramus Intermedius   Dyslipidemia, goal LDL below 70 01/07/2007   Dysrhythmia    Essential hypertension, benign 03/14/2009   Has both true hypertension and white coat hypertension.  Avoid over treatment based on office readings, which run high.     Gastroesophageal reflux disease without esophagitis 01/24/2015   Heart murmur    Hematuria 12/24/2017   2 episodes of gross hematuria without infection   History of colon polyps    History of kidney stones    Impotence of organic origin 01/07/2007   Major depressive disorder 07/11/2015   Mild cognitive impairment of uncertain or unknown etiology 03/28/2022   Myocardial infarction    Parkinson's disease 01/14/2012   Tremor began Jan/Feb 2013 and seems to be progressive Update 04/13/12  Seen by neuro and diagnosed with Parkinson's Disease.    Pre-diabetes 08/06/2012   A1C with cardiac stent was 6.3   Progressive angina 05/16/2019    Past Surgical History:  Past Surgical History:  Procedure Laterality Date   anal fisher repair     CARDIAC CATHETERIZATION  07/11/2012   high graded stenoses small ift circ and small 1st diag   which were  left for medicl therapy, lv systoilc fx preserved   CORONARY ANGIOPLASTY  07/11/2012   90% and 70% lesions RCA  3.0 x32 mm Promus DES   CORONARY BALLOON ANGIOPLASTY N/A 05/16/2019   Procedure: CORONARY BALLOON  ANGIOPLASTY;  Surgeon: Anner Alm ORN, MD;  Location: [facility] INVASIVE CV LAB;  Service: Cardiovascular;  Laterality: N/A;   CORONARY STENT PLACEMENT  07/11/12   EXTRACORPOREAL SHOCK WAVE LITHOTRIPSY Left 04/22/2018   Procedure: LEFT EXTRACORPOREAL SHOCK WAVE LITHOTRIPSY (ESWL);  Surgeon: Sherrilee Belvie CROME, MD;  Location: [facility] ORS;  Service: Urology;  Laterality: Left;   EXTRACORPOREAL SHOCK WAVE LITHOTRIPSY Left 05/31/2018   Procedure: LEFT EXTRACORPOREAL SHOCK WAVE LITHOTRIPSY (ESWL);  Surgeon: Alvaro Hummer, MD;  Location: [facility] ORS;  Service: Urology;  Laterality: Left;  [id number] CHER [id number]   LEFT HEART CATH N/A 07/11/2012   Procedure: LEFT HEART CATH;  Surgeon: Jerel Balding, MD;  Location: [facility] CATH LAB;  Service: Cardiovascular;  Laterality: N/A;   LEFT HEART CATH AND CORONARY ANGIOGRAPHY N/A 05/16/2019   Procedure: LEFT HEART CATH AND CORONARY ANGIOGRAPHY;  Surgeon: Anner Alm ORN, MD;  Location: [facility] INVASIVE CV LAB;  Service: Cardiovascular;  Laterality: N/A;   PERCUTANEOUS CORONARY STENT INTERVENTION (PCI-S) Right 07/11/2012   Procedure: PERCUTANEOUS CORONARY STENT INTERVENTION (PCI-S);  Surgeon: Jerel Balding, MD;  Location: [facility] CATH LAB;  Service: Cardiovascular;  Laterality: Right;    Allergies:  Allergies[1]  Family History:  Family History  Adopted: Yes  Problem Relation Age of Onset   Cancer Mother        bone; kidney   Cancer Father    Heart attack Father     Social History:  Social History[2]  Review of symptoms:  Constitutional:  Negative for unexplained weight loss, night sweats, fever, chills ENT:  Negative for nose bleeds, sinus pain, painful swallowing CV:  Negative for chest pain, shortness of breath, exercise intolerance, palpitations, loss of consciousness Resp:  Negative for cough, wheezing, shortness of breath GI:  Negative for nausea, vomiting, diarrhea, bloody stools GU:  Positives noted in HPI; otherwise negative for gross hematuria,  dysuria, urinary incontinence Neuro:  Negative for seizures, poor balance, limb weakness, slurred speech Psych:  Negative for lack of energy, depression, anxiety Endocrine:  Negative for polydipsia, polyuria, symptoms of hypoglycemia (dizziness, hunger, sweating) Hematologic:  Negative for anemia, purpura, petechia, prolonged or excessive bleeding, use of anticoagulants  Allergic:  Negative for difficulty breathing or choking as a result of exposure to anything; no shellfish allergy; no allergic response (rash/itch) to materials, foods  Physical exam: There were no vitals taken for this visit. GENERAL APPEARANCE:  Well appearing, well developed, well nourished, NAD HEENT: Atraumatic, Normocephalic. NECK: Normal appearance LUNGS: Normal inspiratory and expiratory excursion HEART: Regular Rate ABDOMEN: ***. GU: Phallus normal, no lesions. Scrotal skin normal. Testicles/epididymal structures normal. Meatus normal. Normal anal sphincter tone, prostate ***mL, symmetric, non nodular, non tender. EXTREMITIES: Moves all extremities well.  Without clubbing, cyanosis, or edema. NEUROLOGIC:  Alert and oriented x 3, normal gait, CN II-XII grossly intact.  MENTAL STATUS:  Appropriate. SKIN:  Warm, dry and intact.    Results: No results found for this or any previous visit (from the past 24 hours).  I have reviewed referring/prior physicians notes  I have reviewed urinalysis  I have reviewed PSA results  I have reviewed prior imaging  I have reviewed urine culture results  Assessment: ***   Plan: ***     [1]  Allergies Allergen Reactions   Codeine Hives    Denies Airway involvement  [2]  Social History Tobacco Use   Smoking status: Former    Current packs/day: 0.00    Average packs/day: 1 pack/day for 20.0 years (20.0 ttl pk-yrs)    Types: Cigarettes    Start date: 11/10/1976    Quit date: 11/10/1996    Years since quitting: 28.0   Smokeless tobacco: Never  Vaping Use    Vaping status: Never Used  Substance Use Topics   Alcohol use: No    Alcohol/week: 0.0 standard drinks of alcohol   Drug use: Not Currently    Comment: prior MJ use; not using now, uses CBD oil - 04/21/18   "

## 2024-11-09 ENCOUNTER — Other Ambulatory Visit: Payer: Self-pay | Admitting: Family Medicine

## 2024-11-09 ENCOUNTER — Ambulatory Visit: Admitting: Urology

## 2024-11-09 ENCOUNTER — Ambulatory Visit (HOSPITAL_BASED_OUTPATIENT_CLINIC_OR_DEPARTMENT_OTHER)
Admission: RE | Admit: 2024-11-09 | Discharge: 2024-11-09 | Disposition: A | Discharge status: Home / Self Care | Source: Ambulatory Visit | Attending: Urology | Admitting: Urology

## 2024-11-09 VITALS — BP 127/76 | HR 76 | Temp 98.0°F | Wt 215.0 lb

## 2024-11-09 DIAGNOSIS — Z87442 Personal history of urinary calculi: Secondary | ICD-10-CM | POA: Diagnosis not present

## 2024-11-09 DIAGNOSIS — N201 Calculus of ureter: Secondary | ICD-10-CM

## 2024-11-09 LAB — MICROSCOPIC EXAMINATION

## 2024-11-09 LAB — URINALYSIS, ROUTINE W REFLEX MICROSCOPIC
Glucose, UA: NEGATIVE
Leukocytes,UA: NEGATIVE
Nitrite, UA: NEGATIVE
Specific Gravity, UA: 1.025 (ref 1.005–1.030)
Urobilinogen, Ur: 1 mg/dL (ref 0.2–1.0)
pH, UA: 5.5 (ref 5.0–7.5)

## 2024-11-09 MED ORDER — TAMSULOSIN HCL 0.4 MG PO CAPS: 0.4000 mg | ORAL_CAPSULE | Freq: Every day | ORAL | 1 refills | Status: AC

## 2024-11-09 MED ORDER — OXYCODONE-ACETAMINOPHEN 5-325 MG PO TABS: 1.0000 | ORAL_TABLET | Freq: Four times a day (QID) | ORAL | 0 refills | Status: AC | PRN

## 2024-11-16 ENCOUNTER — Encounter: Payer: Self-pay | Admitting: Urology

## 2024-11-17 ENCOUNTER — Encounter: Payer: Self-pay | Admitting: Family Medicine

## 2024-11-21 ENCOUNTER — Ambulatory Visit: Payer: Self-pay | Admitting: Urology

## 2024-11-23 NOTE — Telephone Encounter (Signed)
 Called and spoke with patient daughter Margarie , she stated that she had spoken with her father on 11/22/24 he told that he wasn't having any pain. He same to think that he no longer has it. I advised her of the message per Dr Matilda advised, she stated that she is going follow up with her father to see how is doing. Give us  a call back to let us  know or via my chart.

## 2024-12-01 NOTE — Telephone Encounter (Signed)
 Patient's daughter, Augustin, returns call to nurse line. She reports that she missed a call from Dr. Madelon yesterday and was returning call in regards to completing FMLA paperwork.   Daughter is available through [employer] or by phone at [phone].  Chiquita JAYSON English, RN

## 2024-12-08 ENCOUNTER — Encounter: Payer: Self-pay | Admitting: Family Medicine

## 2024-12-08 ENCOUNTER — Ambulatory Visit: Admitting: Family Medicine

## 2024-12-08 VITALS — BP 109/72 | HR 91 | Ht 72.0 in | Wt 181.0 lb

## 2024-12-08 DIAGNOSIS — M79604 Pain in right leg: Secondary | ICD-10-CM

## 2024-12-08 DIAGNOSIS — M79605 Pain in left leg: Secondary | ICD-10-CM | POA: Diagnosis not present

## 2024-12-08 DIAGNOSIS — R634 Abnormal weight loss: Secondary | ICD-10-CM | POA: Diagnosis not present

## 2024-12-08 MED ORDER — GABAPENTIN 100 MG PO CAPS: 100.0000 mg | ORAL_CAPSULE | Freq: Three times a day (TID) | ORAL | 0 refills | Status: AC

## 2024-12-08 NOTE — Progress Notes (Signed)
" ° ° °  SUBJECTIVE:   CHIEF COMPLAINT / HPI:   Discussed the use of AI scribe software for clinical note transcription with the patient, who gave verbal consent to proceed.  History of Present Illness Christopher Weeks is a 74 year old male with Parkinson's disease who presents with burning leg pain. He is accompanied by his daughter, Christopher Weeks.  Burning leg pain - Hot burning sensation primarily in the left leg, radiating upward and occasionally to the forehead - Pain is worse at night, occurring four to five times nightly - Ibuprofen provides temporary relief - No recent leg swelling  Systemic symptoms - Episodes of fever and chills associated with leg pain  History of cellulitis - Previous cellulitis in the left leg, treated and resolved - Persistent burning pain since resolution of cellulitis  Restless leg syndrome and medication changes - Previously treated with pramipexole  for restless leg syndrome - Pramipexole  discontinued due to hallucinations - Onset of leg symptoms after discontinuation of pramipexole   Renal colic and associated symptoms - Recent passage of kidney stones - Significant weight loss and decreased appetite since onset of leg pain and kidney stone problems  Analgesic use - Currently using ibuprofen for pain control - Recently completed a short course of codeine    PERTINENT  PMH / PSH: Parkinson's disease  OBJECTIVE:   BP 109/72   Pulse 91   Ht 6' (1.829 m)   Wt 181 lb (82.1 kg)   SpO2 96%   BMI 24.55 kg/m   Physical Exam General: NAD, frail-appearing, diffusely tremulous Neuro: A&O Cardiovascular: RRR, no murmurs,  Respiratory: normal WOB on RA, CTAB, no wheezes, ronchi or rales Abdomen: soft, NTTP, no rebound or guarding Extremities: Moving all 4 extremities equally, no peripheral edema Legs: no obvious swelling, non-tender to palpation, negative straight leg raise, mild xerosis of bilateral skin   ASSESSMENT/PLAN:   Assessment &  Plan Bilateral leg pain Broad differential for patient's pain, but clinically consistent with neuropathic pain.  Given Parkinson's disease may represent neuropathy secondary to this.  May also represent restless leg that was previously being treated by the pramipexole .  No obvious radicular pathic pattern suggesting cause from lumbar spine.  Low concern for DVT, venous stasis or traumatic injury as cause. - Directed to follow-up with his neurologist regarding possible contributing role of Parkinson's disease - Start gabapentin  100 mg 3 times daily - Follow-up 2 weeks Unintentional weight loss Roughly 30 pound weight loss since December, with noted decreased appetite.  Suspect that this is secondary to multiple overlapping medical comorbidities including kidney stones within the past month.  However, given rapid severe loss potential B symptoms, requires further evaluation. - BMP, CBC with differential, TSH, PSA - Lumbar x-ray, given neuropathic pain - Notably prior PSA normal - Previously noted to be anemic, and referred to GI for colonoscopy but patient has so far declined - Discussed twice daily boost or Ensure with normal meals - Consider lumbar MRI if weight is not increased at follow-up visit   Return in about 2 weeks (around 12/22/2024).  Ozell Provencal, MD, PGY-3 [facility] 5:43 PM 12/08/2024  [facility]   "

## 2024-12-08 NOTE — Patient Instructions (Addendum)
 It was great to see you! Thank you for allowing me to participate in your care!  Our plans for today:   VISIT SUMMARY: During your visit, we discussed your chronic burning leg pain, which may be related to your Parkinson's disease or restless leg syndrome. We also addressed your recent unintentional weight loss.  YOUR PLAN: CHRONIC BURNING LEG PAIN: You have been experiencing a hot burning sensation in your left leg, which worsens at night and has been persistent since discontinuing pramipexole . -Follow up with your neurologist for further evaluation and management. -Consider medications for neuropathic pain. -Use non-pharmacological treatments such as icing, heating, lidocaine  patches, and capsaicin cream. - I am starting you on gabapentin .  You will take this 3 times daily.  This should help with your leg pain. - Please go get x-rays of your lumbar spine so if we need to order MRI at your next visit insurance can approve it.  UNINTENTIONAL WEIGHT LOSS: You have experienced significant weight loss, likely related to recent illness and decreased appetite. -We have ordered labs to evaluate your weight loss and check your prostate blood work. - Please follow-up in 2 weeks to see how you are doing.    Contains text generated by [employer].    Please arrive 15 minutes PRIOR to your next scheduled appointment time! If you do not, this affects OTHER patients' care.  Take care and seek immediate care sooner if you develop any concerns.   Christopher Provencal, MD, PGY-3 [facility] 3:09 PM 12/08/2024  [facility]

## 2024-12-09 ENCOUNTER — Other Ambulatory Visit: Payer: Self-pay | Admitting: Urology

## 2024-12-09 ENCOUNTER — Other Ambulatory Visit: Payer: Self-pay | Admitting: Family Medicine

## 2024-12-09 ENCOUNTER — Ambulatory Visit: Payer: Self-pay | Admitting: Family Medicine

## 2024-12-09 DIAGNOSIS — N201 Calculus of ureter: Secondary | ICD-10-CM

## 2024-12-09 LAB — BASIC METABOLIC PANEL WITH GFR
BUN/Creatinine Ratio: 15 (ref 10–24)
BUN: 17 mg/dL (ref 8–27)
CO2: 22 mmol/L (ref 20–29)
Calcium: 10 mg/dL (ref 8.6–10.2)
Chloride: 98 mmol/L (ref 96–106)
Creatinine, Ser: 1.13 mg/dL (ref 0.76–1.27)
Glucose: 109 mg/dL — ABNORMAL HIGH (ref 70–99)
Potassium: 4.1 mmol/L (ref 3.5–5.2)
Sodium: 141 mmol/L (ref 134–144)
eGFR: 69 mL/min/{1.73_m2}

## 2024-12-09 LAB — PSA: Prostate Specific Ag, Serum: 0.9 ng/mL (ref 0.0–4.0)

## 2024-12-09 LAB — CBC WITH DIFFERENTIAL/PLATELET
Basophils Absolute: 0 10*3/uL (ref 0.0–0.2)
Basos: 0 %
EOS (ABSOLUTE): 0 10*3/uL (ref 0.0–0.4)
Eos: 0 %
Hematocrit: 42.5 % (ref 37.5–51.0)
Hemoglobin: 14.6 g/dL (ref 13.0–17.7)
Immature Grans (Abs): 0 10*3/uL (ref 0.0–0.1)
Immature Granulocytes: 0 %
Lymphocytes Absolute: 1 10*3/uL (ref 0.7–3.1)
Lymphs: 12 %
MCH: 32.3 pg (ref 26.6–33.0)
MCHC: 34.4 g/dL (ref 31.5–35.7)
MCV: 94 fL (ref 79–97)
Monocytes Absolute: 0.7 10*3/uL (ref 0.1–0.9)
Monocytes: 9 %
Neutrophils Absolute: 6.4 10*3/uL (ref 1.4–7.0)
Neutrophils: 79 %
Platelets: 279 10*3/uL (ref 150–450)
RBC: 4.52 x10E6/uL (ref 4.14–5.80)
RDW: 14 % (ref 11.6–15.4)
WBC: 8.1 10*3/uL (ref 3.4–10.8)

## 2024-12-09 LAB — TSH RFX ON ABNORMAL TO FREE T4: TSH: 0.74 u[IU]/mL (ref 0.450–4.500)

## 2024-12-12 ENCOUNTER — Encounter: Admitting: Family Medicine

## 2024-12-14 ENCOUNTER — Other Ambulatory Visit: Payer: Self-pay | Admitting: Urology

## 2024-12-14 DIAGNOSIS — N201 Calculus of ureter: Secondary | ICD-10-CM

## 2024-12-16 ENCOUNTER — Encounter: Payer: Self-pay | Admitting: Neurology

## 2025-02-13 ENCOUNTER — Encounter

## 2025-03-31 ENCOUNTER — Ambulatory Visit: Admitting: Neurology
